# Patient Record
Sex: Female | Born: 1935 | Race: Black or African American | Hispanic: No | Smoking: Former smoker
Health system: Southern US, Community
[De-identification: ages and names within clinical notes are randomized; demographics above are authoritative.]

## PROBLEM LIST (undated history)

## (undated) DIAGNOSIS — K219 Gastro-esophageal reflux disease without esophagitis: Secondary | ICD-10-CM

## (undated) DIAGNOSIS — F419 Anxiety disorder, unspecified: Secondary | ICD-10-CM

## (undated) DIAGNOSIS — I10 Essential (primary) hypertension: Secondary | ICD-10-CM

## (undated) DIAGNOSIS — K5790 Diverticulosis of intestine, part unspecified, without perforation or abscess without bleeding: Secondary | ICD-10-CM

## (undated) DIAGNOSIS — I351 Nonrheumatic aortic (valve) insufficiency: Secondary | ICD-10-CM

## (undated) DIAGNOSIS — M1990 Unspecified osteoarthritis, unspecified site: Secondary | ICD-10-CM

## (undated) DIAGNOSIS — I340 Nonrheumatic mitral (valve) insufficiency: Secondary | ICD-10-CM

## (undated) DIAGNOSIS — E7800 Pure hypercholesterolemia, unspecified: Secondary | ICD-10-CM

## (undated) DIAGNOSIS — F32A Depression, unspecified: Secondary | ICD-10-CM

## (undated) DIAGNOSIS — N189 Chronic kidney disease, unspecified: Secondary | ICD-10-CM

## (undated) DIAGNOSIS — D509 Iron deficiency anemia, unspecified: Secondary | ICD-10-CM

---

## 1997-08-23 ENCOUNTER — Ambulatory Visit (HOSPITAL_COMMUNITY): Admission: RE | Admit: 1997-08-23 | Discharge: 1997-08-23 | Payer: Self-pay | Admitting: Geriatric Medicine

## 2002-06-25 ENCOUNTER — Ambulatory Visit (HOSPITAL_COMMUNITY): Admission: RE | Admit: 2002-06-25 | Discharge: 2002-06-25 | Payer: Self-pay | Admitting: Gastroenterology

## 2002-07-14 ENCOUNTER — Encounter: Payer: Self-pay | Admitting: Geriatric Medicine

## 2002-07-14 ENCOUNTER — Encounter: Admission: RE | Admit: 2002-07-14 | Discharge: 2002-07-14 | Payer: Self-pay | Admitting: Geriatric Medicine

## 2002-12-24 ENCOUNTER — Encounter (INDEPENDENT_AMBULATORY_CARE_PROVIDER_SITE_OTHER): Payer: Self-pay | Admitting: Specialist

## 2002-12-24 ENCOUNTER — Ambulatory Visit (HOSPITAL_BASED_OUTPATIENT_CLINIC_OR_DEPARTMENT_OTHER): Admission: RE | Admit: 2002-12-24 | Discharge: 2002-12-24 | Payer: Self-pay | Admitting: Orthopedic Surgery

## 2003-08-02 ENCOUNTER — Encounter: Admission: RE | Admit: 2003-08-02 | Discharge: 2003-08-02 | Payer: Self-pay | Admitting: Geriatric Medicine

## 2003-08-05 ENCOUNTER — Encounter: Admission: RE | Admit: 2003-08-05 | Discharge: 2003-08-05 | Payer: Self-pay

## 2006-06-27 ENCOUNTER — Encounter: Admission: RE | Admit: 2006-06-27 | Discharge: 2006-06-27 | Payer: Self-pay | Admitting: Geriatric Medicine

## 2006-07-11 ENCOUNTER — Encounter: Admission: RE | Admit: 2006-07-11 | Discharge: 2006-07-11 | Payer: Self-pay | Admitting: Geriatric Medicine

## 2006-10-24 ENCOUNTER — Encounter: Admission: RE | Admit: 2006-10-24 | Discharge: 2006-10-24 | Payer: Self-pay | Admitting: Geriatric Medicine

## 2009-09-04 ENCOUNTER — Encounter: Admission: RE | Admit: 2009-09-04 | Discharge: 2009-09-04 | Payer: Self-pay | Admitting: Geriatric Medicine

## 2010-06-10 ENCOUNTER — Encounter: Payer: Self-pay | Admitting: Geriatric Medicine

## 2010-10-05 NOTE — Op Note (Signed)
   NAME:  Molly Neal, Molly Neal                      ACCOUNT NO.:  [id number]   MEDICAL RECORD NO.:  [id number]                   PATIENT TYPE:  AMB   LOCATION:  DSC                                  FACILITY:  [facility]   PHYSICIAN:  Ronald A. Darrelyn Hillock, M.D.             DATE OF BIRTH:  1931-12-02   DATE OF PROCEDURE:  12/24/2002  DATE OF DISCHARGE:                                 OPERATIVE REPORT   SURGEON:  Georges Lynch. Darrelyn Hillock, M.D.   ASSISTANT:  Ebbie Ridge. Paitsel, P.A.   PREOPERATIVE DIAGNOSES:  Mass involving the olecranon region, right elbow.   POSTOPERATIVE DIAGNOSES:  Mass involving the olecranon region, right elbow.   OPERATION:  Excision of a mass involving the olecranon region right elbow,  this was all soft tissue.   DESCRIPTION OF PROCEDURE:  Under general anesthesia, routine orthopedic prep  and draping of the right elbow was carried out. At this time, an incision  was made over the olecranon region right elbow. Great care was taken to  avoid the ulna nerve. We then went down and identified a mass that was  present in the soft tissue. It was a very hard mass, this was totally  removed and the area underlying it was cauterized. We then closed the wound  with 3-0 nylon suture, a sterile Neosporin dressing was applied and she was  placed in a sling.   FOLLOW UP:  She will be on Vicodin 1 every 4h p.r.n. for pain. The specimen  will be sent to the lab for review and she will see [place] in the office in a  week or prior if she has any problems and she will remain in the sling.                                               Ronald A. Darrelyn Hillock, M.D.    RAG/MEDQ  D:  12/24/2002  T:  12/25/2002  Job:  [id number]

## 2010-10-05 NOTE — Op Note (Signed)
   Neal, Molly                        ACCOUNT NO.:  [id number]   MEDICAL RECORD NO.:  [id number]                   PATIENT TYPE:  AMB   LOCATION:  ENDO                                 FACILITY:  [facility]   PHYSICIAN:  Danise Edge, M.D.                DATE OF BIRTH:  [birth year]   DATE OF PROCEDURE:  06/25/2002  DATE OF DISCHARGE:                                 OPERATIVE REPORT   PROCEDURE:  Colonoscopy.   INDICATIONS:  The patient is a 77 year old female scheduled to undergo her  first colonoscopy with polypectomy to prevent colon cancer.  Her serum iron  saturation was slightly low at 14% with a normal serum ferritin.   ENDOSCOPIST:  Danise Edge, M.D.   PREMEDICATION:  Versed 10 mg, Demerol 100 mg.   ENDOSCOPE:  Olympus pediatric colonoscope.   DESCRIPTION OF PROCEDURE:  After obtaining informed consent, the patient was  placed in the left lateral decubitus position.  I positioned intravenous  Demerol and intravenous Versed to achieve conscious sedation for the  procedure.  The patient's blood pressure, oxygen saturation, and cardiac  rhythm were monitored throughout the procedure and documented in the medical  record.   Anal inspection was normal.  Digital rectal exam was normal.  The Olympus  pediatric video colonoscope was introduced into the rectum and advanced to  the cecum.  Colonic preparation for the exam today was excellent.   Rectum normal.   Sigmoid colon and descending colon:  Left colonic diverticulosis.   Splenic flexure normal.   Transverse colon normal.   Hepatic flexure normal.   Ascending colon normal.   Cecum and ileocecal valve normal.   ASSESSMENT:  Left colonic diverticulosis; otherwise normal proctocolonoscopy  to the cecum.  No endoscopic evidence for the presence of colorectal  neoplasia or lower gastrointestinal bleeding.                                              Danise Edge, M.D.     MJ/MEDQ  D:  06/25/2002  T:   06/26/2002  Job:  [id number]   cc:   Hal T. Stoneking, M.D.  [address] [place], [place] [address]  Fax: [phone]

## 2011-02-07 ENCOUNTER — Emergency Department (HOSPITAL_COMMUNITY): Payer: Medicare Other

## 2011-02-07 ENCOUNTER — Emergency Department (HOSPITAL_COMMUNITY)
Admission: EM | Admit: 2011-02-07 | Discharge: 2011-02-07 | Disposition: A | Discharge status: Home / Self Care | Payer: Medicare Other | Attending: Emergency Medicine | Admitting: Emergency Medicine

## 2011-02-07 DIAGNOSIS — R071 Chest pain on breathing: Secondary | ICD-10-CM | POA: Insufficient documentation

## 2011-02-07 DIAGNOSIS — M542 Cervicalgia: Secondary | ICD-10-CM | POA: Insufficient documentation

## 2011-07-22 DIAGNOSIS — J069 Acute upper respiratory infection, unspecified: Secondary | ICD-10-CM | POA: Diagnosis not present

## 2011-07-22 DIAGNOSIS — I10 Essential (primary) hypertension: Secondary | ICD-10-CM | POA: Diagnosis not present

## 2011-07-22 DIAGNOSIS — M25579 Pain in unspecified ankle and joints of unspecified foot: Secondary | ICD-10-CM | POA: Diagnosis not present

## 2011-07-22 DIAGNOSIS — R609 Edema, unspecified: Secondary | ICD-10-CM | POA: Diagnosis not present

## 2011-08-06 DIAGNOSIS — M25579 Pain in unspecified ankle and joints of unspecified foot: Secondary | ICD-10-CM | POA: Diagnosis not present

## 2011-08-06 DIAGNOSIS — I10 Essential (primary) hypertension: Secondary | ICD-10-CM | POA: Diagnosis not present

## 2011-08-06 DIAGNOSIS — H1130 Conjunctival hemorrhage, unspecified eye: Secondary | ICD-10-CM | POA: Diagnosis not present

## 2011-08-06 DIAGNOSIS — D509 Iron deficiency anemia, unspecified: Secondary | ICD-10-CM | POA: Diagnosis not present

## 2011-08-21 DIAGNOSIS — R05 Cough: Secondary | ICD-10-CM | POA: Diagnosis not present

## 2011-09-10 DIAGNOSIS — I872 Venous insufficiency (chronic) (peripheral): Secondary | ICD-10-CM | POA: Diagnosis not present

## 2011-09-10 DIAGNOSIS — F411 Generalized anxiety disorder: Secondary | ICD-10-CM | POA: Diagnosis not present

## 2011-09-10 DIAGNOSIS — R252 Cramp and spasm: Secondary | ICD-10-CM | POA: Diagnosis not present

## 2011-09-10 DIAGNOSIS — I10 Essential (primary) hypertension: Secondary | ICD-10-CM | POA: Diagnosis not present

## 2011-09-10 DIAGNOSIS — M25569 Pain in unspecified knee: Secondary | ICD-10-CM | POA: Diagnosis not present

## 2011-09-12 DIAGNOSIS — M25569 Pain in unspecified knee: Secondary | ICD-10-CM | POA: Diagnosis not present

## 2011-10-08 DIAGNOSIS — H2510 Age-related nuclear cataract, unspecified eye: Secondary | ICD-10-CM | POA: Diagnosis not present

## 2011-10-08 DIAGNOSIS — H1045 Other chronic allergic conjunctivitis: Secondary | ICD-10-CM | POA: Diagnosis not present

## 2011-10-08 DIAGNOSIS — H04129 Dry eye syndrome of unspecified lacrimal gland: Secondary | ICD-10-CM | POA: Diagnosis not present

## 2011-10-08 DIAGNOSIS — H01139 Eczematous dermatitis of unspecified eye, unspecified eyelid: Secondary | ICD-10-CM | POA: Diagnosis not present

## 2011-10-25 DIAGNOSIS — D126 Benign neoplasm of colon, unspecified: Secondary | ICD-10-CM | POA: Diagnosis not present

## 2011-10-25 DIAGNOSIS — K319 Disease of stomach and duodenum, unspecified: Secondary | ICD-10-CM | POA: Diagnosis not present

## 2011-10-25 DIAGNOSIS — D509 Iron deficiency anemia, unspecified: Secondary | ICD-10-CM | POA: Diagnosis not present

## 2011-10-25 DIAGNOSIS — K2980 Duodenitis without bleeding: Secondary | ICD-10-CM | POA: Diagnosis not present

## 2011-10-25 DIAGNOSIS — A048 Other specified bacterial intestinal infections: Secondary | ICD-10-CM | POA: Diagnosis not present

## 2011-10-25 DIAGNOSIS — K5730 Diverticulosis of large intestine without perforation or abscess without bleeding: Secondary | ICD-10-CM | POA: Diagnosis not present

## 2011-10-25 DIAGNOSIS — K449 Diaphragmatic hernia without obstruction or gangrene: Secondary | ICD-10-CM | POA: Diagnosis not present

## 2011-12-03 DIAGNOSIS — F411 Generalized anxiety disorder: Secondary | ICD-10-CM | POA: Diagnosis not present

## 2011-12-03 DIAGNOSIS — D509 Iron deficiency anemia, unspecified: Secondary | ICD-10-CM | POA: Diagnosis not present

## 2011-12-03 DIAGNOSIS — I10 Essential (primary) hypertension: Secondary | ICD-10-CM | POA: Diagnosis not present

## 2011-12-03 DIAGNOSIS — M899 Disorder of bone, unspecified: Secondary | ICD-10-CM | POA: Diagnosis not present

## 2011-12-03 DIAGNOSIS — M25519 Pain in unspecified shoulder: Secondary | ICD-10-CM | POA: Diagnosis not present

## 2011-12-03 DIAGNOSIS — M949 Disorder of cartilage, unspecified: Secondary | ICD-10-CM | POA: Diagnosis not present

## 2011-12-19 DIAGNOSIS — M899 Disorder of bone, unspecified: Secondary | ICD-10-CM | POA: Diagnosis not present

## 2012-01-17 DIAGNOSIS — M25569 Pain in unspecified knee: Secondary | ICD-10-CM | POA: Diagnosis not present

## 2012-01-23 DIAGNOSIS — M1710 Unilateral primary osteoarthritis, unspecified knee: Secondary | ICD-10-CM | POA: Diagnosis not present

## 2012-01-23 DIAGNOSIS — M25569 Pain in unspecified knee: Secondary | ICD-10-CM | POA: Diagnosis not present

## 2012-06-26 ENCOUNTER — Other Ambulatory Visit: Payer: Self-pay | Admitting: Geriatric Medicine

## 2012-06-26 DIAGNOSIS — M810 Age-related osteoporosis without current pathological fracture: Secondary | ICD-10-CM | POA: Diagnosis not present

## 2012-06-26 DIAGNOSIS — M549 Dorsalgia, unspecified: Secondary | ICD-10-CM | POA: Diagnosis not present

## 2012-06-26 DIAGNOSIS — R634 Abnormal weight loss: Secondary | ICD-10-CM | POA: Diagnosis not present

## 2012-06-26 DIAGNOSIS — Z79899 Other long term (current) drug therapy: Secondary | ICD-10-CM | POA: Diagnosis not present

## 2012-07-03 ENCOUNTER — Other Ambulatory Visit: Payer: Medicare Other

## 2012-07-10 ENCOUNTER — Ambulatory Visit
Admission: RE | Admit: 2012-07-10 | Discharge: 2012-07-10 | Disposition: A | Discharge status: Home / Self Care | Payer: Medicare Other | Source: Ambulatory Visit | Attending: Geriatric Medicine | Admitting: Geriatric Medicine

## 2012-07-10 DIAGNOSIS — M48061 Spinal stenosis, lumbar region without neurogenic claudication: Secondary | ICD-10-CM | POA: Diagnosis not present

## 2012-07-10 DIAGNOSIS — M549 Dorsalgia, unspecified: Secondary | ICD-10-CM

## 2012-07-31 DIAGNOSIS — M48061 Spinal stenosis, lumbar region without neurogenic claudication: Secondary | ICD-10-CM | POA: Diagnosis not present

## 2012-08-12 DIAGNOSIS — M48061 Spinal stenosis, lumbar region without neurogenic claudication: Secondary | ICD-10-CM | POA: Diagnosis not present

## 2012-08-19 DIAGNOSIS — M48061 Spinal stenosis, lumbar region without neurogenic claudication: Secondary | ICD-10-CM | POA: Diagnosis not present

## 2012-08-25 DIAGNOSIS — M48061 Spinal stenosis, lumbar region without neurogenic claudication: Secondary | ICD-10-CM | POA: Diagnosis not present

## 2012-09-04 DIAGNOSIS — M48061 Spinal stenosis, lumbar region without neurogenic claudication: Secondary | ICD-10-CM | POA: Diagnosis not present

## 2012-09-09 DIAGNOSIS — M48061 Spinal stenosis, lumbar region without neurogenic claudication: Secondary | ICD-10-CM | POA: Diagnosis not present

## 2012-09-22 DIAGNOSIS — M48061 Spinal stenosis, lumbar region without neurogenic claudication: Secondary | ICD-10-CM | POA: Diagnosis not present

## 2012-10-21 DIAGNOSIS — R7611 Nonspecific reaction to tuberculin skin test without active tuberculosis: Secondary | ICD-10-CM | POA: Diagnosis not present

## 2012-10-21 DIAGNOSIS — I10 Essential (primary) hypertension: Secondary | ICD-10-CM | POA: Diagnosis not present

## 2012-10-21 DIAGNOSIS — F411 Generalized anxiety disorder: Secondary | ICD-10-CM | POA: Diagnosis not present

## 2012-10-21 DIAGNOSIS — L509 Urticaria, unspecified: Secondary | ICD-10-CM | POA: Diagnosis not present

## 2012-10-21 DIAGNOSIS — R634 Abnormal weight loss: Secondary | ICD-10-CM | POA: Diagnosis not present

## 2012-10-30 DIAGNOSIS — M48061 Spinal stenosis, lumbar region without neurogenic claudication: Secondary | ICD-10-CM | POA: Diagnosis not present

## 2012-11-06 DIAGNOSIS — M48061 Spinal stenosis, lumbar region without neurogenic claudication: Secondary | ICD-10-CM | POA: Diagnosis not present

## 2012-11-10 DIAGNOSIS — M48061 Spinal stenosis, lumbar region without neurogenic claudication: Secondary | ICD-10-CM | POA: Diagnosis not present

## 2012-11-12 DIAGNOSIS — M48061 Spinal stenosis, lumbar region without neurogenic claudication: Secondary | ICD-10-CM | POA: Diagnosis not present

## 2012-11-18 DIAGNOSIS — M545 Low back pain: Secondary | ICD-10-CM | POA: Diagnosis not present

## 2012-11-24 DIAGNOSIS — M545 Low back pain: Secondary | ICD-10-CM | POA: Diagnosis not present

## 2012-12-03 DIAGNOSIS — M545 Low back pain: Secondary | ICD-10-CM | POA: Diagnosis not present

## 2012-12-17 DIAGNOSIS — M545 Low back pain: Secondary | ICD-10-CM | POA: Diagnosis not present

## 2012-12-18 DIAGNOSIS — M545 Low back pain: Secondary | ICD-10-CM | POA: Diagnosis not present

## 2012-12-22 DIAGNOSIS — M545 Low back pain: Secondary | ICD-10-CM | POA: Diagnosis not present

## 2012-12-25 DIAGNOSIS — M545 Low back pain: Secondary | ICD-10-CM | POA: Diagnosis not present

## 2013-01-01 DIAGNOSIS — L03119 Cellulitis of unspecified part of limb: Secondary | ICD-10-CM | POA: Diagnosis not present

## 2013-01-01 DIAGNOSIS — L02419 Cutaneous abscess of limb, unspecified: Secondary | ICD-10-CM | POA: Diagnosis not present

## 2013-01-01 DIAGNOSIS — M545 Low back pain: Secondary | ICD-10-CM | POA: Diagnosis not present

## 2013-01-05 DIAGNOSIS — M545 Low back pain: Secondary | ICD-10-CM | POA: Diagnosis not present

## 2013-01-07 DIAGNOSIS — M545 Low back pain: Secondary | ICD-10-CM | POA: Diagnosis not present

## 2013-01-08 DIAGNOSIS — H2510 Age-related nuclear cataract, unspecified eye: Secondary | ICD-10-CM | POA: Diagnosis not present

## 2013-01-08 DIAGNOSIS — H04129 Dry eye syndrome of unspecified lacrimal gland: Secondary | ICD-10-CM | POA: Diagnosis not present

## 2013-01-12 DIAGNOSIS — M545 Low back pain: Secondary | ICD-10-CM | POA: Diagnosis not present

## 2013-01-15 DIAGNOSIS — M545 Low back pain: Secondary | ICD-10-CM | POA: Diagnosis not present

## 2013-01-19 DIAGNOSIS — M545 Low back pain: Secondary | ICD-10-CM | POA: Diagnosis not present

## 2013-01-20 DIAGNOSIS — M1990 Unspecified osteoarthritis, unspecified site: Secondary | ICD-10-CM | POA: Diagnosis not present

## 2013-01-20 DIAGNOSIS — Z79899 Other long term (current) drug therapy: Secondary | ICD-10-CM | POA: Diagnosis not present

## 2013-01-20 DIAGNOSIS — I10 Essential (primary) hypertension: Secondary | ICD-10-CM | POA: Diagnosis not present

## 2013-01-20 DIAGNOSIS — D509 Iron deficiency anemia, unspecified: Secondary | ICD-10-CM | POA: Diagnosis not present

## 2013-01-29 DIAGNOSIS — M545 Low back pain: Secondary | ICD-10-CM | POA: Diagnosis not present

## 2013-02-05 DIAGNOSIS — M2550 Pain in unspecified joint: Secondary | ICD-10-CM | POA: Diagnosis not present

## 2013-02-05 DIAGNOSIS — M545 Low back pain: Secondary | ICD-10-CM | POA: Diagnosis not present

## 2013-02-05 DIAGNOSIS — R262 Difficulty in walking, not elsewhere classified: Secondary | ICD-10-CM | POA: Diagnosis not present

## 2013-02-05 DIAGNOSIS — IMO0002 Reserved for concepts with insufficient information to code with codable children: Secondary | ICD-10-CM | POA: Diagnosis not present

## 2013-02-12 DIAGNOSIS — IMO0002 Reserved for concepts with insufficient information to code with codable children: Secondary | ICD-10-CM | POA: Diagnosis not present

## 2013-02-12 DIAGNOSIS — R262 Difficulty in walking, not elsewhere classified: Secondary | ICD-10-CM | POA: Diagnosis not present

## 2013-02-12 DIAGNOSIS — M2550 Pain in unspecified joint: Secondary | ICD-10-CM | POA: Diagnosis not present

## 2013-02-12 DIAGNOSIS — M545 Low back pain: Secondary | ICD-10-CM | POA: Diagnosis not present

## 2013-02-17 DIAGNOSIS — R262 Difficulty in walking, not elsewhere classified: Secondary | ICD-10-CM | POA: Diagnosis not present

## 2013-02-17 DIAGNOSIS — M2550 Pain in unspecified joint: Secondary | ICD-10-CM | POA: Diagnosis not present

## 2013-02-17 DIAGNOSIS — M545 Low back pain: Secondary | ICD-10-CM | POA: Diagnosis not present

## 2013-02-17 DIAGNOSIS — IMO0002 Reserved for concepts with insufficient information to code with codable children: Secondary | ICD-10-CM | POA: Diagnosis not present

## 2013-02-19 DIAGNOSIS — M2550 Pain in unspecified joint: Secondary | ICD-10-CM | POA: Diagnosis not present

## 2013-02-19 DIAGNOSIS — M545 Low back pain: Secondary | ICD-10-CM | POA: Diagnosis not present

## 2013-02-19 DIAGNOSIS — R262 Difficulty in walking, not elsewhere classified: Secondary | ICD-10-CM | POA: Diagnosis not present

## 2013-02-19 DIAGNOSIS — IMO0002 Reserved for concepts with insufficient information to code with codable children: Secondary | ICD-10-CM | POA: Diagnosis not present

## 2013-02-24 DIAGNOSIS — M545 Low back pain: Secondary | ICD-10-CM | POA: Diagnosis not present

## 2013-02-24 DIAGNOSIS — IMO0002 Reserved for concepts with insufficient information to code with codable children: Secondary | ICD-10-CM | POA: Diagnosis not present

## 2013-02-24 DIAGNOSIS — R262 Difficulty in walking, not elsewhere classified: Secondary | ICD-10-CM | POA: Diagnosis not present

## 2013-02-24 DIAGNOSIS — M2550 Pain in unspecified joint: Secondary | ICD-10-CM | POA: Diagnosis not present

## 2013-02-26 DIAGNOSIS — R262 Difficulty in walking, not elsewhere classified: Secondary | ICD-10-CM | POA: Diagnosis not present

## 2013-02-26 DIAGNOSIS — IMO0002 Reserved for concepts with insufficient information to code with codable children: Secondary | ICD-10-CM | POA: Diagnosis not present

## 2013-02-26 DIAGNOSIS — M2550 Pain in unspecified joint: Secondary | ICD-10-CM | POA: Diagnosis not present

## 2013-02-26 DIAGNOSIS — M545 Low back pain: Secondary | ICD-10-CM | POA: Diagnosis not present

## 2013-03-03 DIAGNOSIS — R262 Difficulty in walking, not elsewhere classified: Secondary | ICD-10-CM | POA: Diagnosis not present

## 2013-03-03 DIAGNOSIS — M2550 Pain in unspecified joint: Secondary | ICD-10-CM | POA: Diagnosis not present

## 2013-03-03 DIAGNOSIS — IMO0002 Reserved for concepts with insufficient information to code with codable children: Secondary | ICD-10-CM | POA: Diagnosis not present

## 2013-03-03 DIAGNOSIS — M545 Low back pain: Secondary | ICD-10-CM | POA: Diagnosis not present

## 2013-03-05 DIAGNOSIS — IMO0002 Reserved for concepts with insufficient information to code with codable children: Secondary | ICD-10-CM | POA: Diagnosis not present

## 2013-03-05 DIAGNOSIS — M2550 Pain in unspecified joint: Secondary | ICD-10-CM | POA: Diagnosis not present

## 2013-03-05 DIAGNOSIS — R262 Difficulty in walking, not elsewhere classified: Secondary | ICD-10-CM | POA: Diagnosis not present

## 2013-03-05 DIAGNOSIS — M545 Low back pain: Secondary | ICD-10-CM | POA: Diagnosis not present

## 2013-03-19 DIAGNOSIS — R262 Difficulty in walking, not elsewhere classified: Secondary | ICD-10-CM | POA: Diagnosis not present

## 2013-03-19 DIAGNOSIS — IMO0002 Reserved for concepts with insufficient information to code with codable children: Secondary | ICD-10-CM | POA: Diagnosis not present

## 2013-03-19 DIAGNOSIS — M2550 Pain in unspecified joint: Secondary | ICD-10-CM | POA: Diagnosis not present

## 2013-03-19 DIAGNOSIS — M545 Low back pain: Secondary | ICD-10-CM | POA: Diagnosis not present

## 2013-05-26 DIAGNOSIS — I10 Essential (primary) hypertension: Secondary | ICD-10-CM | POA: Diagnosis not present

## 2013-05-26 DIAGNOSIS — Z1331 Encounter for screening for depression: Secondary | ICD-10-CM | POA: Diagnosis not present

## 2013-05-26 DIAGNOSIS — Z23 Encounter for immunization: Secondary | ICD-10-CM | POA: Diagnosis not present

## 2013-05-26 DIAGNOSIS — Z0000 Encounter for general adult medical examination without abnormal findings: Secondary | ICD-10-CM | POA: Diagnosis not present

## 2013-05-28 ENCOUNTER — Other Ambulatory Visit: Payer: Self-pay | Admitting: Geriatric Medicine

## 2013-05-28 DIAGNOSIS — Z1231 Encounter for screening mammogram for malignant neoplasm of breast: Secondary | ICD-10-CM

## 2013-06-15 ENCOUNTER — Ambulatory Visit
Admission: RE | Admit: 2013-06-15 | Discharge: 2013-06-15 | Disposition: A | Discharge status: Home / Self Care | Payer: Medicare Other | Source: Ambulatory Visit | Attending: Geriatric Medicine | Admitting: Geriatric Medicine

## 2013-06-15 DIAGNOSIS — Z1231 Encounter for screening mammogram for malignant neoplasm of breast: Secondary | ICD-10-CM | POA: Diagnosis not present

## 2013-06-17 ENCOUNTER — Other Ambulatory Visit: Payer: Self-pay | Admitting: Geriatric Medicine

## 2013-06-17 DIAGNOSIS — R928 Other abnormal and inconclusive findings on diagnostic imaging of breast: Secondary | ICD-10-CM

## 2013-06-28 ENCOUNTER — Ambulatory Visit
Admission: RE | Admit: 2013-06-28 | Discharge: 2013-06-28 | Disposition: A | Discharge status: Home / Self Care | Payer: Medicare Other | Source: Ambulatory Visit | Attending: Geriatric Medicine | Admitting: Geriatric Medicine

## 2013-06-28 DIAGNOSIS — R928 Other abnormal and inconclusive findings on diagnostic imaging of breast: Secondary | ICD-10-CM

## 2013-06-28 DIAGNOSIS — N6049 Mammary duct ectasia of unspecified breast: Secondary | ICD-10-CM | POA: Diagnosis not present

## 2013-08-24 DIAGNOSIS — I10 Essential (primary) hypertension: Secondary | ICD-10-CM | POA: Diagnosis not present

## 2013-08-24 DIAGNOSIS — M25569 Pain in unspecified knee: Secondary | ICD-10-CM | POA: Diagnosis not present

## 2013-08-31 DIAGNOSIS — M1710 Unilateral primary osteoarthritis, unspecified knee: Secondary | ICD-10-CM | POA: Diagnosis not present

## 2013-12-28 DIAGNOSIS — Z23 Encounter for immunization: Secondary | ICD-10-CM | POA: Diagnosis not present

## 2013-12-28 DIAGNOSIS — Z79899 Other long term (current) drug therapy: Secondary | ICD-10-CM | POA: Diagnosis not present

## 2013-12-28 DIAGNOSIS — M1990 Unspecified osteoarthritis, unspecified site: Secondary | ICD-10-CM | POA: Diagnosis not present

## 2013-12-28 DIAGNOSIS — I10 Essential (primary) hypertension: Secondary | ICD-10-CM | POA: Diagnosis not present

## 2014-01-20 DIAGNOSIS — M1710 Unilateral primary osteoarthritis, unspecified knee: Secondary | ICD-10-CM | POA: Diagnosis not present

## 2014-02-21 DIAGNOSIS — I10 Essential (primary) hypertension: Secondary | ICD-10-CM | POA: Diagnosis not present

## 2014-02-21 DIAGNOSIS — R202 Paresthesia of skin: Secondary | ICD-10-CM | POA: Diagnosis not present

## 2014-02-21 DIAGNOSIS — R600 Localized edema: Secondary | ICD-10-CM | POA: Diagnosis not present

## 2014-03-21 DIAGNOSIS — I10 Essential (primary) hypertension: Secondary | ICD-10-CM | POA: Diagnosis not present

## 2014-03-21 DIAGNOSIS — R269 Unspecified abnormalities of gait and mobility: Secondary | ICD-10-CM | POA: Diagnosis not present

## 2014-04-28 ENCOUNTER — Emergency Department (HOSPITAL_COMMUNITY): Payer: Medicare Other

## 2014-04-28 ENCOUNTER — Emergency Department (HOSPITAL_COMMUNITY)
Admission: EM | Admit: 2014-04-28 | Discharge: 2014-04-28 | Disposition: A | Discharge status: Home / Self Care | Payer: Medicare Other | Attending: Emergency Medicine | Admitting: Emergency Medicine

## 2014-04-28 ENCOUNTER — Encounter (HOSPITAL_COMMUNITY): Payer: Self-pay

## 2014-04-28 DIAGNOSIS — Y92219 Unspecified school as the place of occurrence of the external cause: Secondary | ICD-10-CM | POA: Insufficient documentation

## 2014-04-28 DIAGNOSIS — S6992XA Unspecified injury of left wrist, hand and finger(s), initial encounter: Secondary | ICD-10-CM | POA: Diagnosis not present

## 2014-04-28 DIAGNOSIS — Y9389 Activity, other specified: Secondary | ICD-10-CM | POA: Diagnosis not present

## 2014-04-28 DIAGNOSIS — W01190A Fall on same level from slipping, tripping and stumbling with subsequent striking against furniture, initial encounter: Secondary | ICD-10-CM | POA: Insufficient documentation

## 2014-04-28 DIAGNOSIS — Y992 Volunteer activity: Secondary | ICD-10-CM | POA: Diagnosis not present

## 2014-04-28 DIAGNOSIS — M79642 Pain in left hand: Secondary | ICD-10-CM | POA: Diagnosis not present

## 2014-04-28 DIAGNOSIS — S59912A Unspecified injury of left forearm, initial encounter: Secondary | ICD-10-CM | POA: Diagnosis not present

## 2014-04-28 DIAGNOSIS — M25512 Pain in left shoulder: Secondary | ICD-10-CM | POA: Diagnosis not present

## 2014-04-28 DIAGNOSIS — R6884 Jaw pain: Secondary | ICD-10-CM | POA: Diagnosis not present

## 2014-04-28 DIAGNOSIS — W19XXXA Unspecified fall, initial encounter: Secondary | ICD-10-CM

## 2014-04-28 DIAGNOSIS — S4992XA Unspecified injury of left shoulder and upper arm, initial encounter: Secondary | ICD-10-CM | POA: Diagnosis not present

## 2014-04-28 DIAGNOSIS — I10 Essential (primary) hypertension: Secondary | ICD-10-CM | POA: Insufficient documentation

## 2014-04-28 HISTORY — DX: Unspecified osteoarthritis, unspecified site: M19.90

## 2014-04-28 HISTORY — DX: Essential (primary) hypertension: I10

## 2014-04-28 MED ORDER — TRAMADOL HCL 50 MG PO TABS: 50.0000 mg | ORAL_TABLET | Freq: Four times a day (QID) | ORAL | Status: AC | PRN

## 2014-04-28 NOTE — ED Notes (Signed)
PA at bedside.

## 2014-04-28 NOTE — ED Notes (Signed)
Pt ambulated to restroom without distress.  
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
## 2014-04-28 NOTE — ED Notes (Signed)
Patient transported to X-ray without distress.  
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
## 2014-04-28 NOTE — Discharge Instructions (Signed)

## 2014-04-28 NOTE — ED Notes (Signed)
MD at bedside. 

## 2014-05-30 DIAGNOSIS — Z0000 Encounter for general adult medical examination without abnormal findings: Secondary | ICD-10-CM | POA: Diagnosis not present

## 2014-05-30 DIAGNOSIS — I351 Nonrheumatic aortic (valve) insufficiency: Secondary | ICD-10-CM | POA: Diagnosis not present

## 2014-05-30 DIAGNOSIS — Z79899 Other long term (current) drug therapy: Secondary | ICD-10-CM | POA: Diagnosis not present

## 2014-05-30 DIAGNOSIS — F325 Major depressive disorder, single episode, in full remission: Secondary | ICD-10-CM | POA: Diagnosis not present

## 2014-05-30 DIAGNOSIS — E780 Pure hypercholesterolemia: Secondary | ICD-10-CM | POA: Diagnosis not present

## 2014-05-30 DIAGNOSIS — K219 Gastro-esophageal reflux disease without esophagitis: Secondary | ICD-10-CM | POA: Diagnosis not present

## 2014-05-30 DIAGNOSIS — M810 Age-related osteoporosis without current pathological fracture: Secondary | ICD-10-CM | POA: Diagnosis not present

## 2014-05-30 DIAGNOSIS — I10 Essential (primary) hypertension: Secondary | ICD-10-CM | POA: Diagnosis not present

## 2014-05-30 DIAGNOSIS — Z23 Encounter for immunization: Secondary | ICD-10-CM | POA: Diagnosis not present

## 2014-05-30 DIAGNOSIS — Z1389 Encounter for screening for other disorder: Secondary | ICD-10-CM | POA: Diagnosis not present

## 2014-06-15 DIAGNOSIS — M810 Age-related osteoporosis without current pathological fracture: Secondary | ICD-10-CM | POA: Diagnosis not present

## 2014-07-14 DIAGNOSIS — M810 Age-related osteoporosis without current pathological fracture: Secondary | ICD-10-CM | POA: Diagnosis not present

## 2014-07-14 DIAGNOSIS — E559 Vitamin D deficiency, unspecified: Secondary | ICD-10-CM | POA: Diagnosis not present

## 2014-07-20 ENCOUNTER — Emergency Department (HOSPITAL_COMMUNITY)
Admission: EM | Admit: 2014-07-20 | Discharge: 2014-07-20 | Disposition: A | Discharge status: Home / Self Care | Payer: Medicare Other | Attending: Emergency Medicine | Admitting: Emergency Medicine

## 2014-07-20 ENCOUNTER — Encounter (HOSPITAL_COMMUNITY): Payer: Self-pay | Admitting: *Deleted

## 2014-07-20 ENCOUNTER — Emergency Department (HOSPITAL_COMMUNITY): Payer: Medicare Other

## 2014-07-20 DIAGNOSIS — I10 Essential (primary) hypertension: Secondary | ICD-10-CM | POA: Diagnosis not present

## 2014-07-20 DIAGNOSIS — R079 Chest pain, unspecified: Secondary | ICD-10-CM | POA: Diagnosis not present

## 2014-07-20 DIAGNOSIS — Z8739 Personal history of other diseases of the musculoskeletal system and connective tissue: Secondary | ICD-10-CM | POA: Insufficient documentation

## 2014-07-20 DIAGNOSIS — R1013 Epigastric pain: Secondary | ICD-10-CM | POA: Diagnosis present

## 2014-07-20 DIAGNOSIS — R0789 Other chest pain: Secondary | ICD-10-CM | POA: Diagnosis not present

## 2014-07-20 DIAGNOSIS — Z79899 Other long term (current) drug therapy: Secondary | ICD-10-CM | POA: Diagnosis not present

## 2014-07-20 DIAGNOSIS — K224 Dyskinesia of esophagus: Secondary | ICD-10-CM | POA: Diagnosis not present

## 2014-07-20 LAB — COMPREHENSIVE METABOLIC PANEL
ALBUMIN: 4.5 g/dL (ref 3.5–5.2)
ALT: 15 U/L (ref 0–35)
ANION GAP: 7 (ref 5–15)
AST: 45 U/L — ABNORMAL HIGH (ref 0–37)
Alkaline Phosphatase: 100 U/L (ref 39–117)
BILIRUBIN TOTAL: 1.1 mg/dL (ref 0.3–1.2)
BUN: 22 mg/dL (ref 6–23)
CO2: 29 mmol/L (ref 19–32)
CREATININE: 0.98 mg/dL (ref 0.50–1.10)
Calcium: 10.2 mg/dL (ref 8.4–10.5)
Chloride: 106 mmol/L (ref 96–112)
GFR calc non Af Amer: 53 mL/min — ABNORMAL LOW (ref >90)
GFR, EST AFRICAN AMERICAN: 61 mL/min — AB (ref >90)
Glucose, Bld: 116 mg/dL — ABNORMAL HIGH (ref 70–99)
Potassium: 4 mmol/L (ref 3.5–5.1)
Sodium: 142 mmol/L (ref 135–145)
TOTAL PROTEIN: 8.5 g/dL — AB (ref 6.0–8.3)

## 2014-07-20 LAB — I-STAT TROPONIN, ED
TROPONIN I, POC: 0 ng/mL (ref 0.00–0.08)
Troponin i, poc: 0 ng/mL (ref 0.00–0.08)

## 2014-07-20 LAB — CBC
HEMATOCRIT: 41.2 % (ref 36.0–46.0)
Hemoglobin: 13.2 g/dL (ref 12.0–15.0)
MCH: 26.6 pg (ref 26.0–34.0)
MCHC: 32 g/dL (ref 30.0–36.0)
MCV: 83.1 fL (ref 78.0–100.0)
Platelets: 256 10*3/uL (ref 150–400)
RBC: 4.96 MIL/uL (ref 3.87–5.11)
RDW: 13.2 % (ref 11.5–15.5)
WBC: 6.5 10*3/uL (ref 4.0–10.5)

## 2014-07-20 LAB — LIPASE, BLOOD: Lipase: 28 U/L (ref 11–59)

## 2014-07-20 MED ORDER — SODIUM CHLORIDE 0.9 % IV BOLUS (SEPSIS)
1000.0000 mL | Freq: Once | INTRAVENOUS | Status: AC
  Administered 2014-07-20: 1000 mL via INTRAVENOUS

## 2014-07-20 MED ORDER — ONDANSETRON HCL 4 MG/2ML IJ SOLN
4.0000 mg | Freq: Once | INTRAMUSCULAR | Status: AC
  Administered 2014-07-20: 4 mg via INTRAVENOUS
  Filled 2014-07-20: qty 2

## 2014-07-20 MED ORDER — GI COCKTAIL ~~LOC~~
30.0000 mL | Freq: Once | ORAL | Status: AC
  Administered 2014-07-20: 30 mL via ORAL
  Filled 2014-07-20: qty 30

## 2014-07-20 MED ORDER — GLUCAGON HCL RDNA (DIAGNOSTIC) 1 MG IJ SOLR
1.0000 mg | Freq: Once | INTRAMUSCULAR | Status: AC
Start: 2014-07-20 — End: 2014-07-20
  Administered 2014-07-20: 1 mg via INTRAVENOUS
  Filled 2014-07-20: qty 1

## 2014-07-20 MED ORDER — PANTOPRAZOLE SODIUM 40 MG PO TBEC
40.0000 mg | DELAYED_RELEASE_TABLET | Freq: Once | ORAL | Status: AC
  Administered 2014-07-20: 40 mg via ORAL
  Filled 2014-07-20: qty 1

## 2014-07-20 MED ORDER — STERILE WATER FOR INJECTION IJ SOLN
INTRAMUSCULAR | Status: AC
  Administered 2014-07-20: 10 mL
  Filled 2014-07-20: qty 10

## 2014-07-20 MED ORDER — ESOMEPRAZOLE MAGNESIUM 40 MG PO CPDR: DELAYED_RELEASE_CAPSULE | ORAL | Status: DC

## 2014-07-20 MED ORDER — DIAZEPAM 5 MG/ML IJ SOLN
2.5000 mg | Freq: Once | INTRAMUSCULAR | Status: AC
  Administered 2014-07-20: 2.5 mg via INTRAVENOUS
  Filled 2014-07-20: qty 2

## 2014-07-20 MED ORDER — NITROGLYCERIN 0.4 MG SL SUBL
0.4000 mg | SUBLINGUAL_TABLET | SUBLINGUAL | Status: DC | PRN
  Administered 2014-07-20: 0.4 mg via SUBLINGUAL
  Filled 2014-07-20: qty 1

## 2014-07-20 NOTE — ED Notes (Signed)
Tolerated swallowing water very well.

## 2014-07-20 NOTE — ED Notes (Signed)
Pt in stating that since yesterday she thinks she has a food blockage in her throat, unable to tolerate eating or drinking anything, states she can swallow things but it comes back up, no distress noted

## 2014-07-20 NOTE — ED Notes (Signed)
Pt ambulated to restroom with steady gait.

## 2014-07-20 NOTE — ED Notes (Signed)
Pt reports that she was unable to take any of her medications today.

## 2014-07-20 NOTE — ED Provider Notes (Signed)
CSN: [id number]     Arrival date & time 07/20/14  1112 History   First MD Initiated Contact with Patient 07/20/14 1121     Chief Complaint  Patient presents with  . Foreign Body     (Consider location/radiation/quality/duration/timing/severity/associated sxs/prior Treatment) Patient is a 81 y.o. female presenting with foreign body and chest pain. The history is provided by the patient.  Foreign Body Associated symptoms: no abdominal pain, no cough and no vomiting   Chest Pain Pain location:  Epigastric Pain quality: aching and burning   Pain radiates to:  Does not radiate Pain radiates to the back: no   Pain severity:  Severe Onset quality:  Gradual Timing:  Constant Progression:  Unchanged Chronicity:  Recurrent Context: at rest   Associated symptoms: no abdominal pain, no cough, no fever, no shortness of breath and not vomiting     Past Medical History  Diagnosis Date  . Hypertension   . Arthritis    History reviewed. No pertinent past surgical history. History reviewed. No pertinent family history. History  Substance Use Topics  . Smoking status: Never Smoker   . Smokeless tobacco: Not on file  . Alcohol Use: No   OB History    No data available     Review of Systems  Constitutional: Negative for fever.  Respiratory: Negative for cough and shortness of breath.   Cardiovascular: Positive for chest pain.  Gastrointestinal: Negative for vomiting and abdominal pain.  All other systems reviewed and are negative.     Allergies  Aspirin  Home Medications   Prior to Admission medications   Medication Sig Start Date End Date Taking? Authorizing Provider  traMADol (ULTRAM) 50 MG tablet Take 1 tablet (50 mg total) by mouth every 6 (six) hours as needed. 04/28/14   Montine Circle, PA-C   BP 163/70 mmHg  Pulse 80  Temp(Src) 98.7 F (37.1 C)  Resp 22  Ht 5\' 3"  (1.6 m)  Wt 125 lb (56.7 kg)  BMI 22.15 kg/m2  SpO2 100% Physical Exam  Constitutional: She is  oriented to person, place, and time. She appears well-developed and well-nourished. No distress.  HENT:  Head: Normocephalic and atraumatic.  Mouth/Throat: Oropharynx is clear and moist.  Eyes: EOM are normal. Pupils are equal, round, and reactive to light.  Neck: Normal range of motion. Neck supple.  Cardiovascular: Normal rate and regular rhythm.  Exam reveals no friction rub.   No murmur heard. Pulmonary/Chest: Effort normal and breath sounds normal. No respiratory distress. She has no wheezes. She has no rales.  Abdominal: Soft. She exhibits no distension. There is tenderness (epigastric). There is no rebound.  Musculoskeletal: Normal range of motion. She exhibits no edema.  Neurological: She is alert and oriented to person, place, and time.  Skin: No rash noted. She is not diaphoretic.  Nursing note and vitals reviewed.   ED Course  Procedures (including critical care time) Labs Review Labs Reviewed  CBC  COMPREHENSIVE METABOLIC PANEL  LIPASE, BLOOD  I-STAT TROPOININ, ED    Imaging Review Dg Chest Portable 1 View  07/20/2014   CLINICAL DATA:  Chest fullness  EXAM: PORTABLE CHEST - 1 VIEW  COMPARISON:  10/21/2012  FINDINGS: Cardiac shadow is again enlarged. The lungs are well aerated bilaterally. No focal infiltrate or sizable effusion is seen. Patient rotation to the left accentuates the mediastinal markings.  IMPRESSION: No acute abnormality noted.   Electronically Signed   By: Inez Catalina M.D.   On: 07/20/2014 12:50  EKG Interpretation   Date/Time:  Wednesday July 20 2014 12:39:59 EST Ventricular Rate:  69 PR Interval:  137 QRS Duration: 91 QT Interval:  417 QTC Calculation: 447 R Axis:   -39 Text Interpretation:  Sinus rhythm Ventricular premature complex Sinus  pause Left axis deviation Probable anterior infarct, age indeterminate No  significant change since last tracing Confirmed by Mingo Amber  MD, Lasheena Frieze  [phone]) on 07/20/2014 5:07:42 PM      MDM   Final  diagnoses:  Esophageal spasm    71F here with epigastric chest pain. Feels like food is stuck at the bottom of her esophagus. Intermittent periods of this prior, but never lasted this long. Wasn't eating when this happened, was getting prepared to eat. Belching, spitting up. No respiratory distress. I feel this is likely esophageal spasm. Doubt ACS since she's persistently belching in the room Will start with valium, glucagon, NTG.  Feeling better after medications. Chest x-ray is normal. Serial troponins are normal. I spoke with patient's PCP, Dr. Felipa Eth, who stated she does have a large hiatal hernia and history of GERD. We'll put patient on PPI. Dr. Felipa Eth was comfortable with this plan. He has reported she has had similar issues in the past. Stable for discharge.   Evelina Bucy, MD 07/20/14 [phone]

## 2014-07-20 NOTE — Discharge Instructions (Signed)
Esophageal Spasm °Esophageal spasm is an uncoordinated contraction of the muscles of the esophagus (the tube which carries food from your mouth to your stomach). Normally, the muscles of the esophagus alternate between contraction and relaxation starting from the top of the esophagus and working down to the bottom. This moves the food from the mouth to the stomach. In esophageal spasm, all the muscles contract at once. This causes pain and fails to move the food along. As a result, you may have trouble swallowing.  °Women are more likely than men to have esophageal spasm. The cause of the spasms is not known. Sometimes eating hot or cold foods triggers the condition and this may be due to an overly sensitive esophagus. This is not an infectious disease and cannot be passed to others. °SYMPTOMS  °Symptoms of esophageal spasm may include: chest pain, burning or pain with swallowing, and difficulty swallowing.  °DIAGNOSIS  °Esophageal spasm can be diagnosed by a test called manometry (pressure studies of the esophagus). In this test, a special tube is inserted down the esophagus. The tube measures the muscle activity of the esophagus. Abnormal contractions mixed with normal movement helps confirm the diagnosis.  °A person with a hypersensitive esophagus may be diagnosed by inflating a long balloon in the person's esophagus. If this causes the same symptoms, preventive methods may work. °PREVENTION  °Avoid hot or cold foods if that seems to be a trigger. °PROGNOSIS  °This condition does not go away, nor is treatment entirely satisfactory. Patients need to be careful of what they eat. They need to continue on medication if a useful one is found. Fortunately, the condition does not get progressively worse as time passes. °Esophageal spasm does not usually lead to more serious problems but sometimes the pain can be disabling. If a person becomes afraid to eat they may become malnourished and lose weight.  °TREATMENT  °· A  procedure in which instruments of increasing size are inserted through the esophagus to enlarge (dilate) it are used. °· Medications that decrease acid-production of the stomach may be used such as proton-pump inhibitors or H2-blockers. °· Medications of several types can be used to relax the muscles of the esophagus. °· An individual with a hypersensitive esophagus sometimes improves with low doses of medications normally used for depression. °· No treatment for esophageal spasm is effective for everyone. Often several approaches will be tried before one works. In many cases, the symptoms will improve, but will not go away completely. °· For severe cases, relief is obtained two-thirds of the time by cutting the muscles along the entire length of the esophagus. This is a major surgical procedure. °· Your symptoms are usually the best guide to how well the treatment for esophageal spasm works. °SIDE EFFECTS OF TREATMENTS °· Nitrates can cause headaches and low blood pressure. °· Calcium channel blockers can cause: °¨ Feeling sick to your stomach (nausea). °¨ Constipation and other side effects. °· Antidepressants can cause side effects that depend on the medication used. °HOME CARE INSTRUCTIONS  °· Let your caregiver know if problems are getting worse, or if you get food stuck in your esophagus for longer than 1 hour or as directed and are unable to swallow liquid. °· Take medications as directed and with permission of your caregiver. Ask about what to do if a medication seems to get stuck in your esophagus. Only take over-the-counter or prescription medicines for pain, discomfort, or fever as directed by your caregiver. °· Soft and liquid foods   pass more easily than solid pieces. °SEEK IMMEDIATE MEDICAL CARE IF:  °· You develop severe chest pain, especially if the pain is crushing or pressure-like and spreads to the arms, back, neck, or jaw, or if you have sweating, nausea, or shortness of breath. THIS COULD BE AN  EMERGENCY. Do not wait to see if the pain will go away. Get medical help at once. Call 911 or 0 (operator). DO NOT drive yourself to the hospital. °· Your chest pain gets worse and does not go away with rest. °· You have an attack of chest pain lasting longer than usual despite rest and treatment with the medications your physician has prescribed. °· You wake from sleep with chest pain or shortness of breath. °· You feel dizzy or faint. °· You have chest pain, not typical of your usual pain, caused by your esophagus for which you originally saw your caregiver. °MAKE SURE YOU:  °· Understand these instructions. °· Will watch your condition. °· Will get help right away if you are not doing well or get worse. °Document Released: 07/27/2002 Document Revised: 07/29/2011 Document Reviewed: 07/30/2013 °[employer]® Patient Information ©2015 [employer]. This information is not intended to replace advice given to you by your health care provider. Make sure you discuss any questions you have with your health care provider. ° °

## 2014-11-11 DIAGNOSIS — I10 Essential (primary) hypertension: Secondary | ICD-10-CM | POA: Diagnosis not present

## 2014-11-11 DIAGNOSIS — M1990 Unspecified osteoarthritis, unspecified site: Secondary | ICD-10-CM | POA: Diagnosis not present

## 2014-11-14 DIAGNOSIS — M170 Bilateral primary osteoarthritis of knee: Secondary | ICD-10-CM | POA: Diagnosis not present

## 2014-12-20 DIAGNOSIS — R21 Rash and other nonspecific skin eruption: Secondary | ICD-10-CM | POA: Diagnosis not present

## 2014-12-20 DIAGNOSIS — I10 Essential (primary) hypertension: Secondary | ICD-10-CM | POA: Diagnosis not present

## 2015-02-24 IMAGING — DX DG HAND COMPLETE 3+V*L*
3 series · 3 of 3 positions shown · non-contrast
Comparison: None.

CLINICAL DATA: Fell.  Pain.  Pain in the left hand.

EXAM:
LEFT HAND - COMPLETE 3+ VIEW

[hand ap]
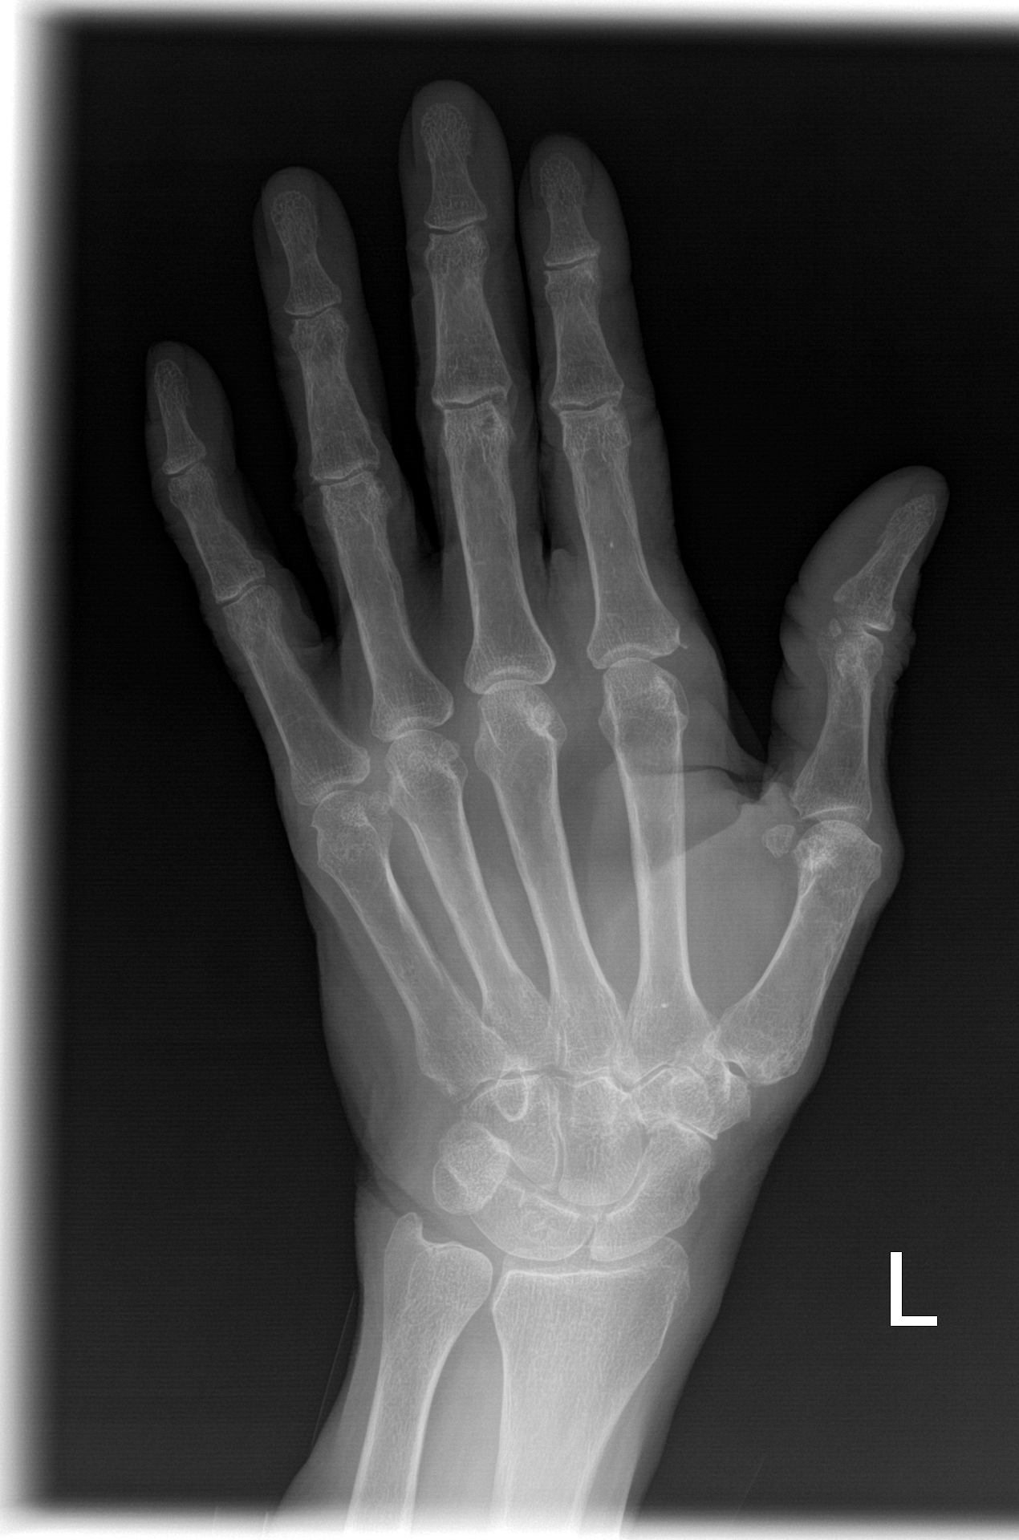

[hand obl]
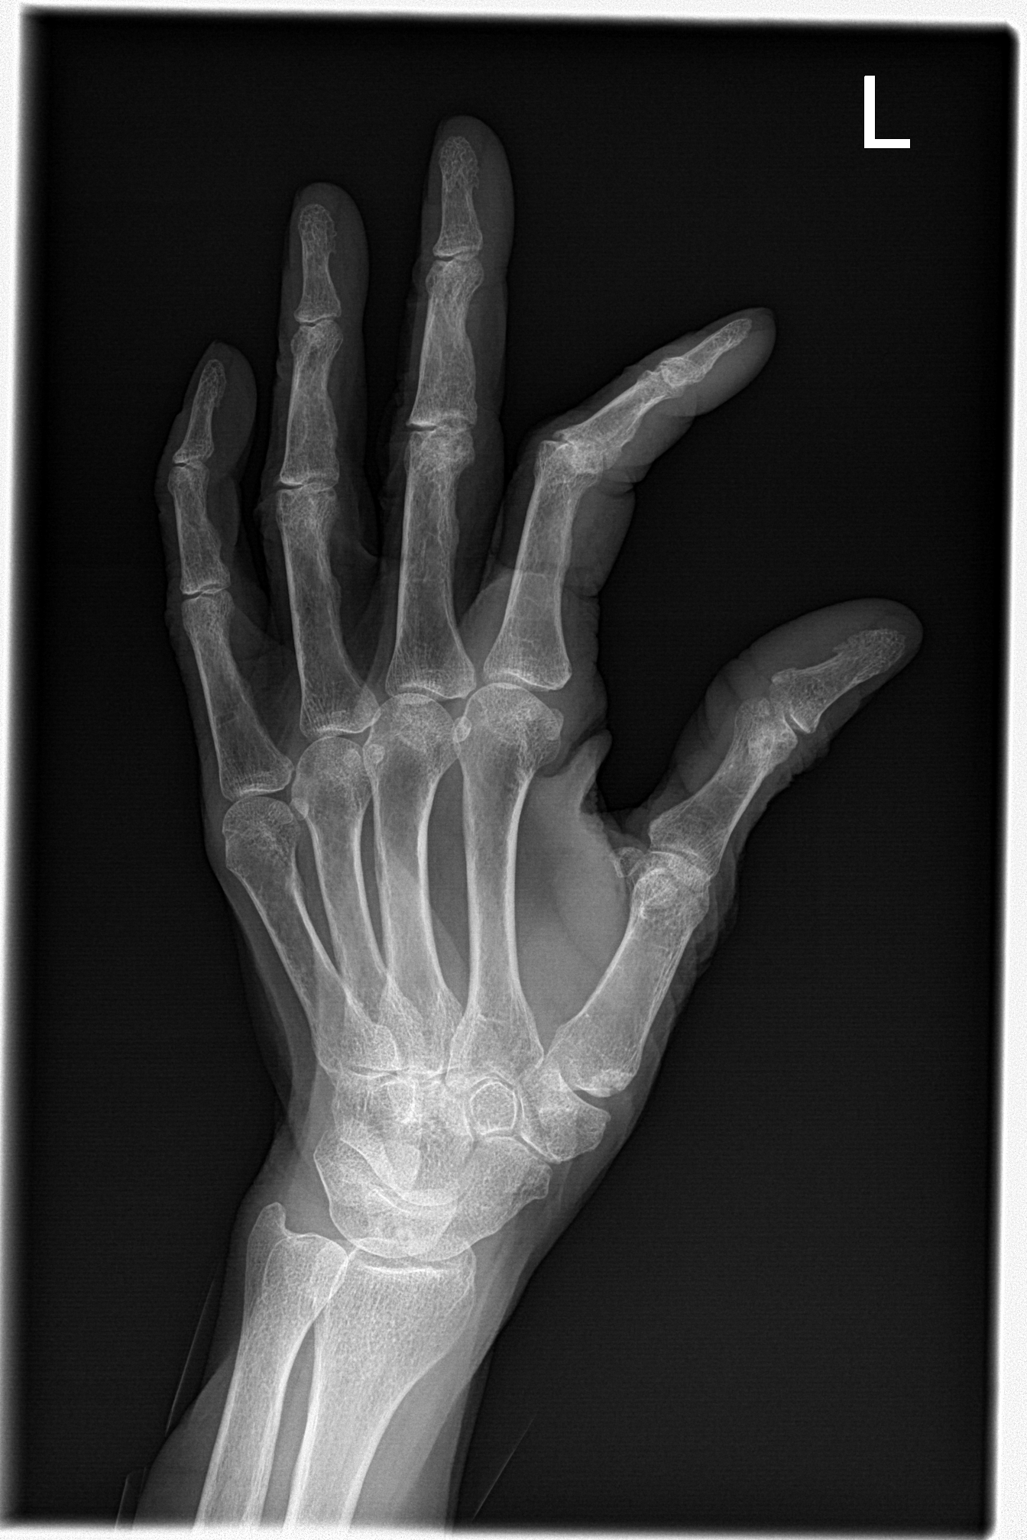

[hand lat]
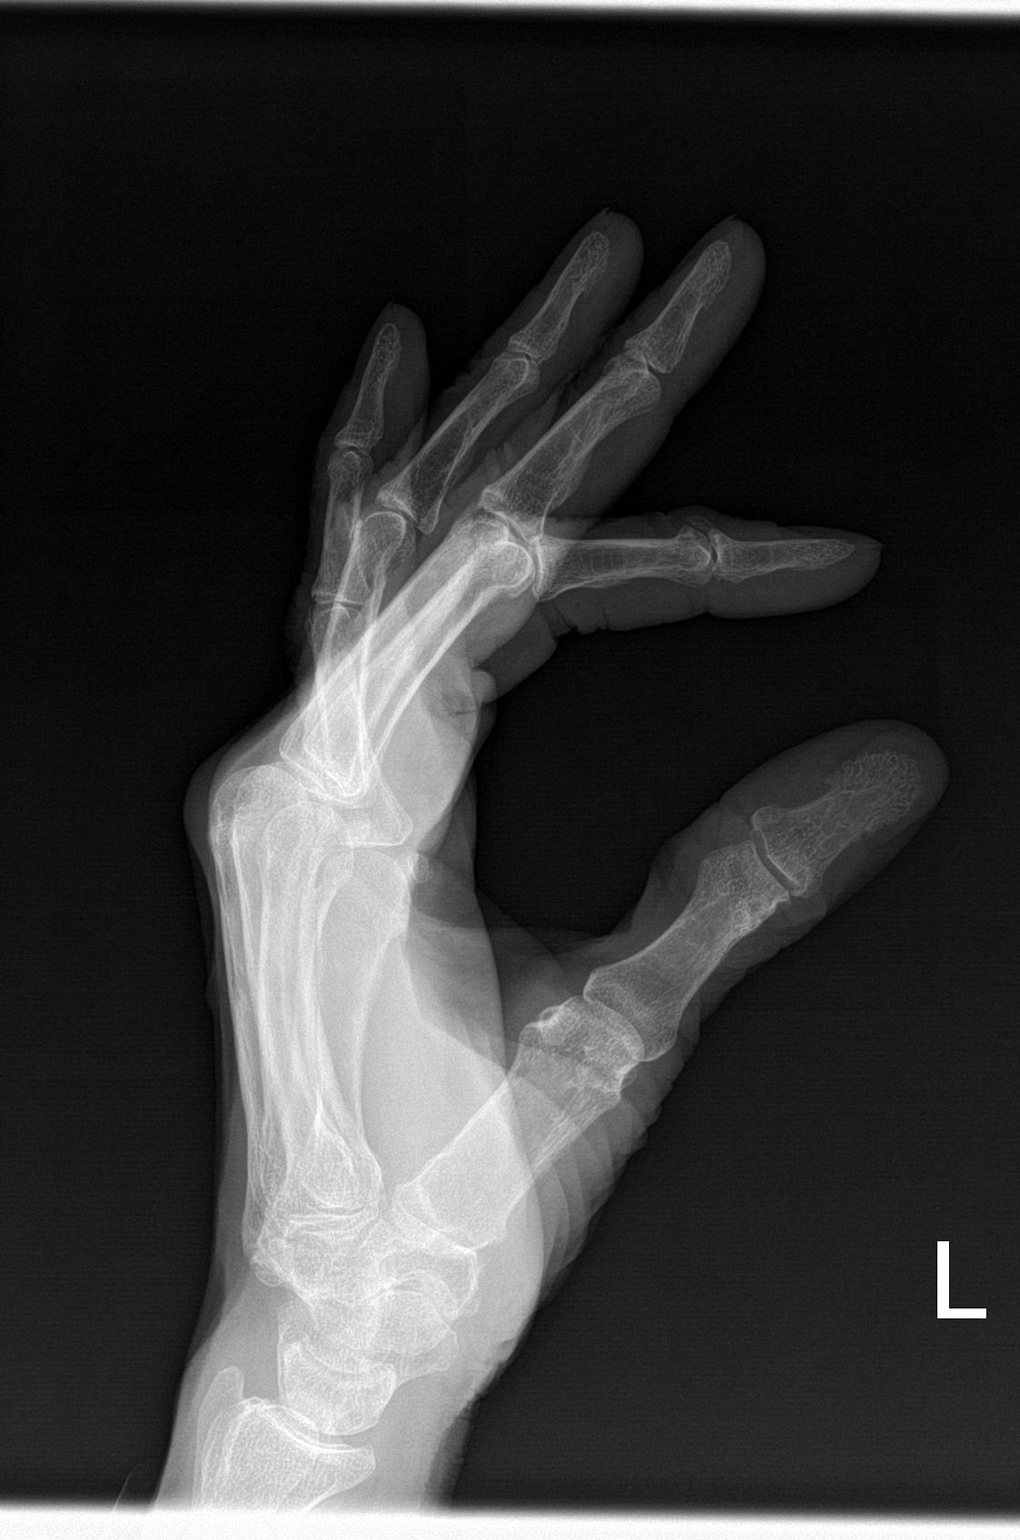

[3 of 3 positions shown; findings below may reference images not displayed]

FINDINGS: There are mild degenerative changes primarily in the first
carpometacarpal joint and the second distal interphalangeal joint.
No acute fracture or subluxation. No suspicious lytic or blastic
lesions. Normal soft tissues.
IMPRESSION: 1. Mild degenerative changes.
2.  No evidence for acute  abnormality.

## 2015-02-24 IMAGING — DX DG SHOULDER 2+V*L*
3 series · 3 of 3 positions shown · non-contrast
Comparison: Chest x-ray 02/07/2011 and 10/21/2012

CLINICAL DATA: Fell.  Pain.  Lateral shoulder pain.

EXAM:
LEFT SHOULDER - 2+ VIEW

[shoulder grashey]
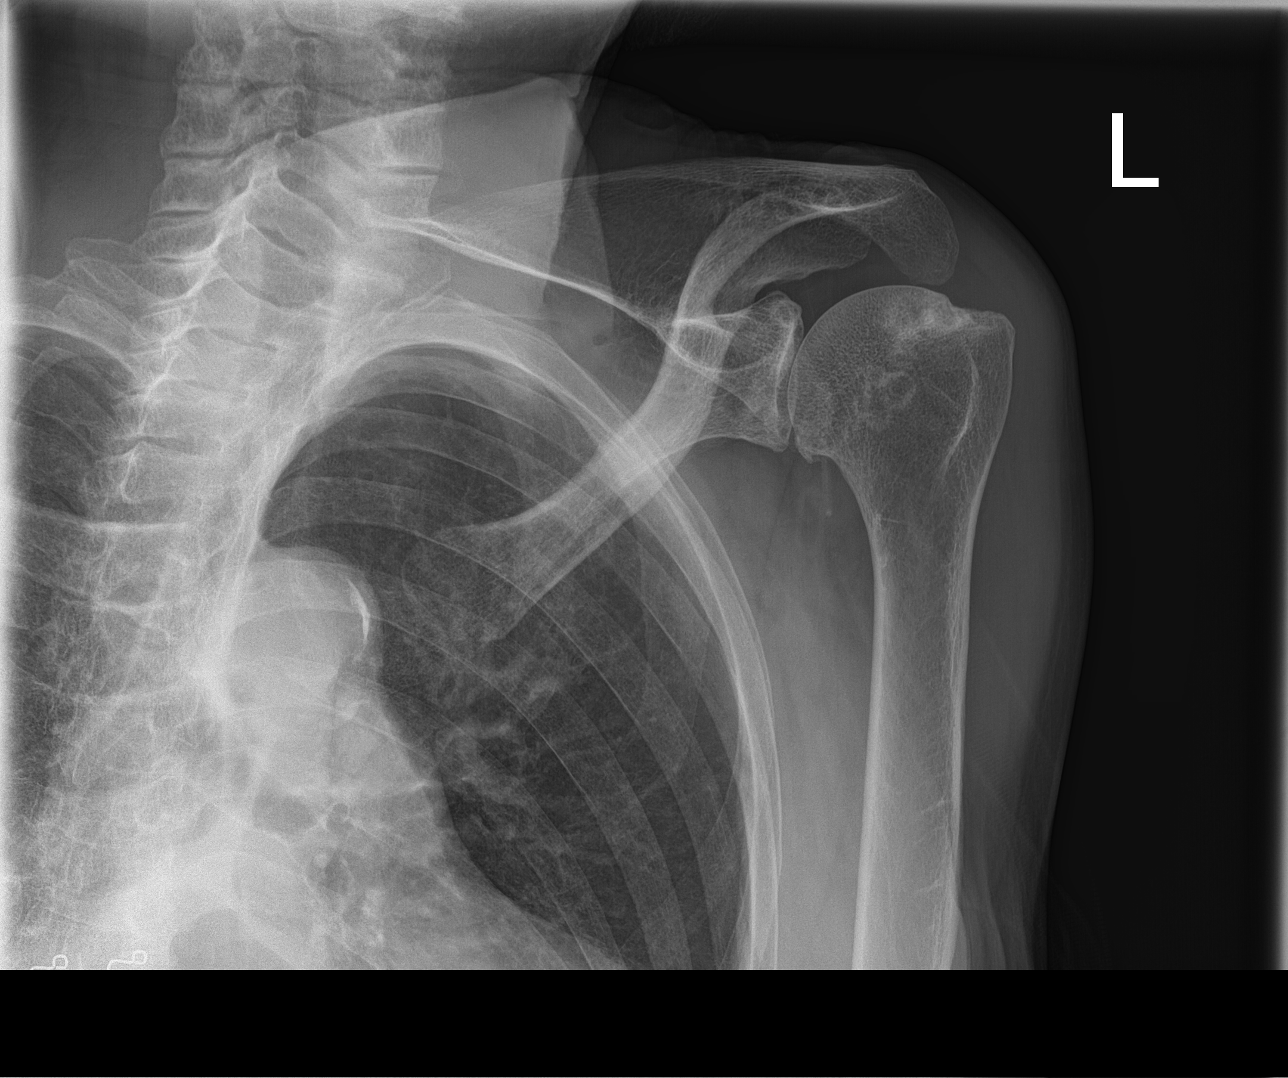

[shoulder y-view]
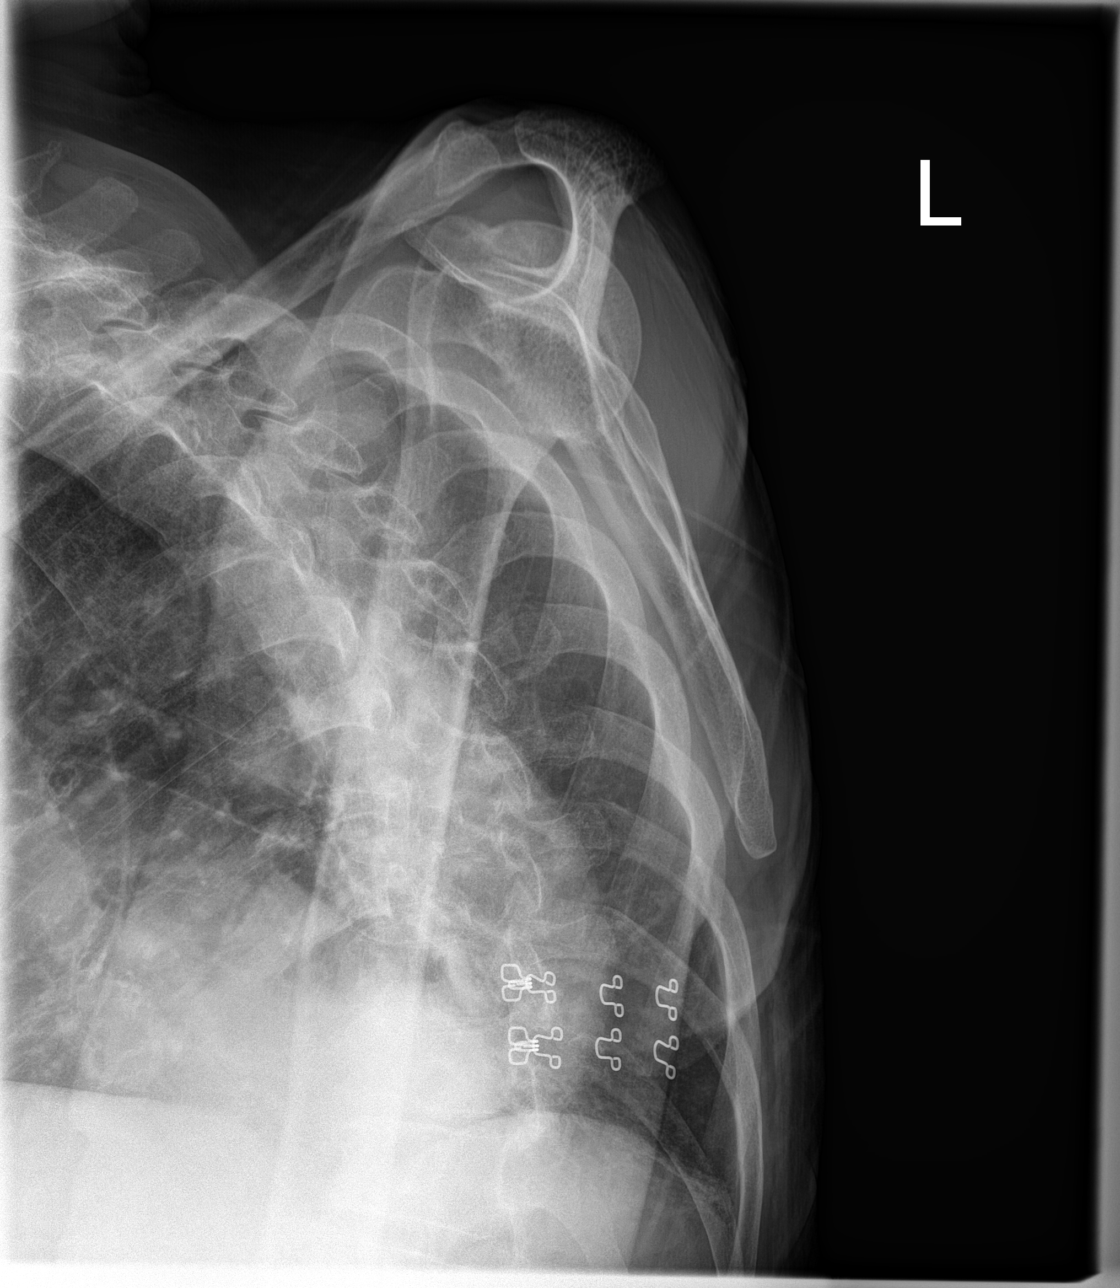

[shoulder axillary]
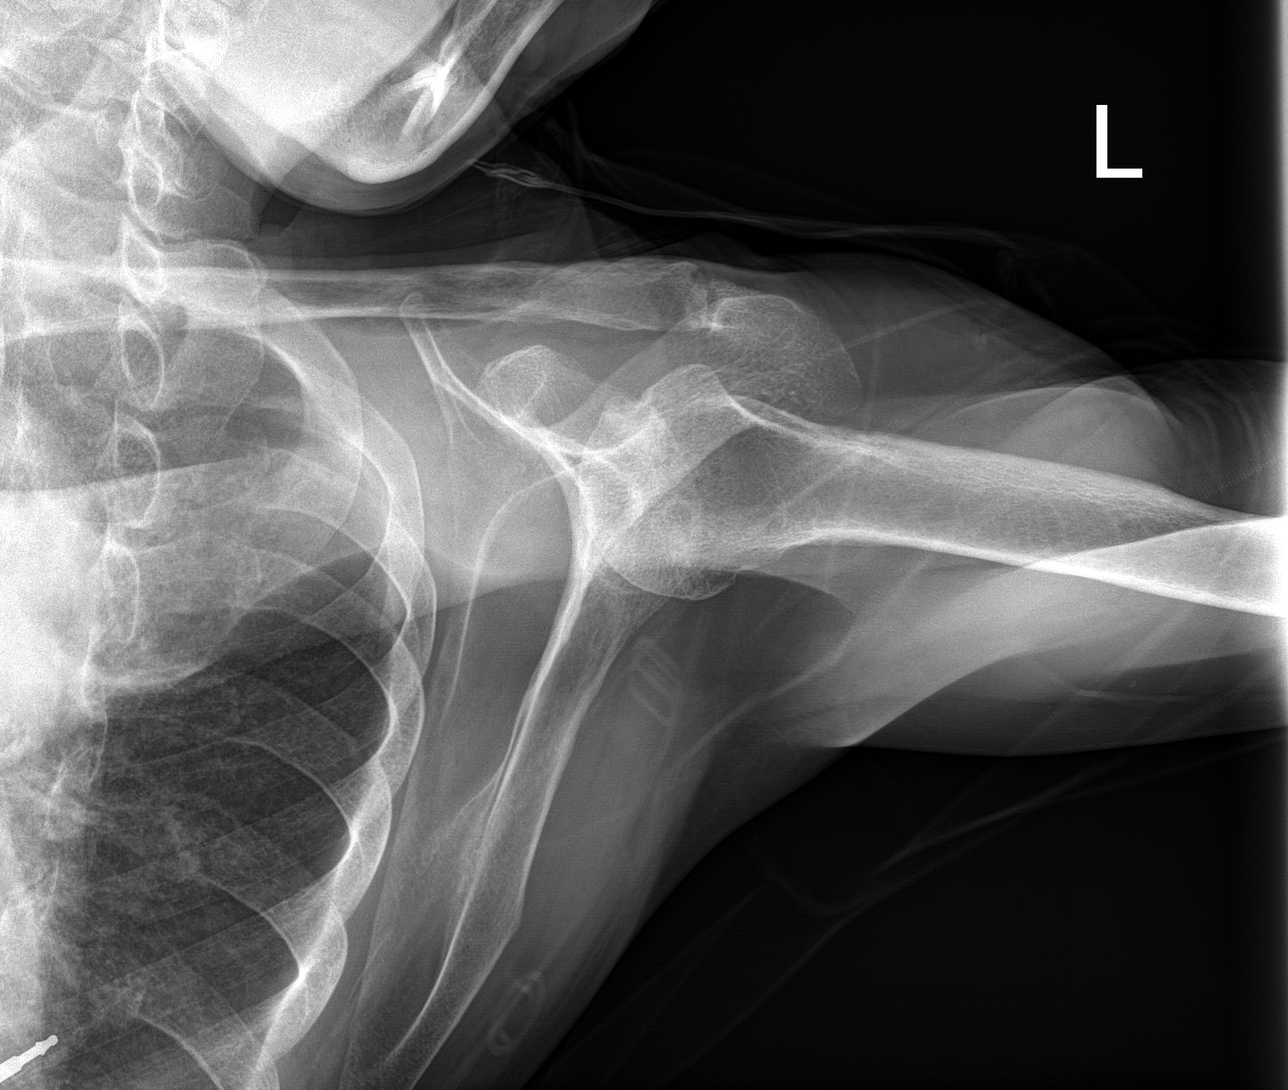

[3 of 3 positions shown; findings below may reference images not displayed]

FINDINGS: There is mild degenerative change in the glenohumeral joint. No
evidence for acute fracture or subluxation. Left lung apex is
unremarkable.
IMPRESSION: No evidence for acute  abnormality.

## 2015-03-30 DIAGNOSIS — F325 Major depressive disorder, single episode, in full remission: Secondary | ICD-10-CM | POA: Diagnosis not present

## 2015-03-30 DIAGNOSIS — M21612 Bunion of left foot: Secondary | ICD-10-CM | POA: Diagnosis not present

## 2015-03-30 DIAGNOSIS — I10 Essential (primary) hypertension: Secondary | ICD-10-CM | POA: Diagnosis not present

## 2015-03-30 DIAGNOSIS — E7800 Pure hypercholesterolemia, unspecified: Secondary | ICD-10-CM | POA: Diagnosis not present

## 2015-03-30 DIAGNOSIS — R51 Headache: Secondary | ICD-10-CM | POA: Diagnosis not present

## 2015-03-30 DIAGNOSIS — M79606 Pain in leg, unspecified: Secondary | ICD-10-CM | POA: Diagnosis not present

## 2015-03-30 DIAGNOSIS — Z23 Encounter for immunization: Secondary | ICD-10-CM | POA: Diagnosis not present

## 2015-03-30 DIAGNOSIS — K000 Anodontia: Secondary | ICD-10-CM | POA: Diagnosis not present

## 2015-03-30 DIAGNOSIS — Z79899 Other long term (current) drug therapy: Secondary | ICD-10-CM | POA: Diagnosis not present

## 2015-05-10 DIAGNOSIS — H25813 Combined forms of age-related cataract, bilateral: Secondary | ICD-10-CM | POA: Diagnosis not present

## 2015-08-01 DIAGNOSIS — E7800 Pure hypercholesterolemia, unspecified: Secondary | ICD-10-CM | POA: Diagnosis not present

## 2015-08-01 DIAGNOSIS — I351 Nonrheumatic aortic (valve) insufficiency: Secondary | ICD-10-CM | POA: Diagnosis not present

## 2015-08-01 DIAGNOSIS — Z0000 Encounter for general adult medical examination without abnormal findings: Secondary | ICD-10-CM | POA: Diagnosis not present

## 2015-08-01 DIAGNOSIS — Z79899 Other long term (current) drug therapy: Secondary | ICD-10-CM | POA: Diagnosis not present

## 2015-08-01 DIAGNOSIS — Z7189 Other specified counseling: Secondary | ICD-10-CM | POA: Diagnosis not present

## 2015-08-01 DIAGNOSIS — I10 Essential (primary) hypertension: Secondary | ICD-10-CM | POA: Diagnosis not present

## 2015-08-01 DIAGNOSIS — F325 Major depressive disorder, single episode, in full remission: Secondary | ICD-10-CM | POA: Diagnosis not present

## 2015-08-29 ENCOUNTER — Other Ambulatory Visit: Payer: Self-pay

## 2015-08-29 VITALS — BP 172/70 | Ht 61.0 in | Wt 118.8 lb

## 2015-08-29 DIAGNOSIS — L299 Pruritus, unspecified: Secondary | ICD-10-CM | POA: Diagnosis not present

## 2015-08-29 DIAGNOSIS — I10 Essential (primary) hypertension: Secondary | ICD-10-CM

## 2015-08-29 DIAGNOSIS — L309 Dermatitis, unspecified: Secondary | ICD-10-CM | POA: Diagnosis not present

## 2015-08-29 NOTE — Patient Outreach (Signed)
[place] [facility]) Care Management  08/29/2015  Molly Neal [birth year] [id number]  Screening  Referral Date:  08/28/15 Source:  Dr. Felipa Eth Issue:  HTN.  Social needs:  Patient needs a SW to help with resources and financial issues.   Outreach call #1 to patient.  Patient reached and screening completed.   Subjective: Patient states she has just returned from an unplanned MD visit to assess a rash and terrible itch she has developed.  States MD provided injection in the office and new prescriptions which she has just picked up.  States much "nervousness" due to the itch.  States she does not know the cause.   Providers: Primary MD: Dr. Felipa Eth -  last appt: 08/29/15   Insurance: Medicare and [place] Medicaid  Social: Patient lives alone.  Patient states she lives below poverty level.    Mobility: ambulates with no assistive devices.  Has a cane and uses if needed.   Falls: none Social Issues:  States she nearly froze during the cold spell.  Heat went off for about 2 weeks.  Patient had to turn on the oven and had issues with the gas and power bills.  Landlord brought an Web designer but this run up the bill.  Qualified for the energy program and completed the form in November 2016.  Received a confirmation letter 2 weeks ago which confirmed some money had been paid on the electric bill but unsure how much and what is owed.   Transportation issues and difficulty getting medications picked up at the pharmacy; having to pay people to transport her to the pharmacy.  [facility].  Owns a car but not driving; needs DMV car title issues and drivers  license has expired.  Has to find and pay friends.  Eye glasses: Per MD, patient has not purchased glasses due to her income.  Caregiver:  None;  Children in [place] but does not see them.  Resources:  Church Consent: yes  DME: cane   THN conditions:   HTN, Osteoarthritis, depression. GERD, Nonrheumatic aortic  valve insufficiency, hypercholesterolemia, allergic rhinitis, DJD of the knees, iron deficiency anemia, diverticulosis, stage II renal disease, large hiatal hernia Weight 118.8 (down from 125 on 03/30/2015) BP 172/70  Medications:  Patient taking about 14-15 medications  Co-pay cost issues: No  Flu Vaccine: 03/30/2015 Pneumonia Vaccine: Prevnar 12/28/2013 and Pneumovax 06/13/2000  Plan:  Referral Date:  08/28/15 Screening:  08/29/2015 [facility] Telephonic RN CM:  08/29/2015 Program:  HTN  THN Social Work Referral -[place] Referral -issues with getting medications picked up at the pharmacy due to transportation issues.  Has to pay for transportation and has limited income.  Please assess for a home delivery system.   RN CM advised in next [facility] scheduled contact call within next 30 days for initial assessment and care coordination services as needed. RN CM advised to please notify MD of any changes in condition prior to scheduled appt's.   RN CM provided contact name and # [phone] or main office # [phone] and 24-hour nurse line # [phone].  RN CM confirmed patient is aware of 911 services for urgent emergency needs. RN CM sent successful outreach letter and  [facility] Introductory package. RN CM notified [place] Management Assistant: agreed to services/case opened.  Mariann Laster, RN, BSN, [facility], CCM  [employer] Management Coordinator [phone] Direct [phone] Cell [phone] Office [phone] Fax

## 2015-08-29 NOTE — Addendum Note (Signed)
Addended by: Standley Brooking on: 08/29/2015 03:41 PM   Modules accepted: Orders

## 2015-09-04 ENCOUNTER — Other Ambulatory Visit: Payer: Self-pay | Admitting: *Deleted

## 2015-09-04 NOTE — Patient Outreach (Signed)
[facility]) Care Management  09/04/2015  Molly Neal [birth year] [id number]   CSW received a new referral on patient from Mariann Laster, Telephonic Nurse Case Manager with [place] Management, indicated that patient would benefit from social work services and resources to assist with referrals to various community agencies.  Mrs. Hutchinson requested that CSW complete a Financial Assessment Form on patient to determine whether or not patient would qualify for any type of financial assistance through [place] Management.  Patient is an Adult Medicaid recipient through the [place], therefore, patient is already eligible to receive financial assistance through [place] Management.  CSW sent a request to Freda Jackson, as well as Josepha Pigg, both Care Management Assistant's with [place] Management, to mail resource information to patient's home for her review.  Mr. Evelene Croon will mail patient information pertaining to transportation resources, such as Medicaid Transportation/Taxi (through the [place]), Armed forces technical officer through [employer] of [employer] and [employer] (Specialized [employer]), also  through the [place].  Mrs. Quentin Cornwall will mail patient information pertaining to vision ware and make a referral to the [employer]. CSW made an initial attempt to try and contact patient today to perform phone assessment, as well as assess and assist with social needs and services, without success.  A HIPAA complaint message was left for patient on voicemail.  CSW is currently awaiting a return call.  [name], BSW, MSW, LCSW  Licensed Education officer, environmental Health System  Mailing [place] [address] [place], [place]  [address] Physical Address-[address], [place], [place] [address] Toll Free Main # [phone] Fax # [phone] Cell # [phone]  Fax # [phone]  [name]  Patient's preferred language:  [place]   English  ATTENTION:  If you speak English, language assistance services, free of charge, are available to you.    Nondiscrimination and Accessibility Statement: Discrimination is Against the [employer], a subsidiary of [employer], complies with [employer] civil rights laws and does not discriminate on the basis of race, color, national origin, age, disability, or sex.  [place] does not exclude people or treat them differently because of race, color, national origin, age, disability, or sex.  [place] Providers will:  . Provide free aids and services to people with disabilities to communicate effectively with [place], such as:     ? Qualified sign language interpreters  ? Written information in other formats (large print, audio, accessible electronic formats, other formats)   . Provide free language services to people whose primary language is not [place], such as:    ? Qualified interpreters    ? Information written in other languages   If you need these services, contact your [facility] Forensic psychologist.  If you believe that a [employer] [employer] has failed to provide these services or discriminated in another way on the basis of race, color, national origin, age, disability, or sex, you can file a Tourist information centre manager with: [place], [phone] or [email or web address]  You can file a grievance in person or by mail, fax, or email. If you need help filing a grievance, you may contact Valrie Hart, Interim Compliance Officer, [facility] Department of Compliance and Integrity, [address] 2nd Floor, [place],  [place]. [place], [phone], [email or web address].  You can also file a civil rights complaint with the U.S. Department of Health and Financial controller, Office for [employer], electronically through the Office for Civil Rights Complaint Portal, available at OnSiteLending.nl.jsf, or by mail or phone at:  [place]. Department of Health and Human Services [address], [place] Room [phone], [facility] Building [place], [place]  [phone], [phone] (TDD) Complaint forms are available at [email or web address].

## 2015-09-08 ENCOUNTER — Other Ambulatory Visit: Payer: Self-pay | Admitting: Pharmacist

## 2015-09-08 NOTE — Patient Outreach (Signed)
[facility]) Care Management  09/08/2015  Molly Neal May 18, 1932 [id number]   Patient was referred to [place] by Rocky Link, [facility] RN Telephonic due to patient reported difficulty with transportation to pharmacy to obtain medications.   Pharmacist placed call to patient and patient answered, verified name and date of birth.  Pharmacist explained purpose of call.    Patient was appreciated of call but wishes to call back because she was about to head out the door.    Patient provided with [facility] Pharmacist phone number and will await patient call back.    Karrie Meres, PharmD, [place] [phone]

## 2015-09-11 ENCOUNTER — Other Ambulatory Visit: Payer: Self-pay | Admitting: Pharmacist

## 2015-09-11 NOTE — Patient Outreach (Signed)
[place] [facility]) Care Management  [place]   09/11/2015  Molly Neal [birth year] [id number]  Subjective:  Patient was referred to [place] by Rocky Link, [facility] RN Telephonic regarding difficulty obtaining her medications from her local pharmacy.   Pharmacist called patient and patient verified name and date of birth, pharmacist explained purpose of call.  Patient reports [facility] and has to pay someone to get her to the pharmacy to pick up her prescriptions.  She does report that she has her prescriptions at this time and that she stopped alendronate and atorvastatin on her own.    She reports that she experienced muscle aches/pains with atorvastatin and isn't sure what other cholesterol lowering medications she has used in the past.    Patient also reports that her medications come due for refills at different times as well, and this makes it difficult to go to the pharmacy multiple times each month.    Objective:   Current Medications: Current Outpatient Prescriptions  Medication Sig Dispense Refill  . alendronate (FOSAMAX) 70 MG tablet Take 70 mg by mouth once a week. Take with a full glass of water on an empty stomach.    Marland Kitchen amLODipine (NORVASC) 10 MG tablet Take 10 mg by mouth daily.  6  . atorvastatin (LIPITOR) 10 MG tablet Take 10 mg by mouth daily.  6  . benazepril (LOTENSIN) 40 MG tablet Take 40 mg by mouth daily.  11  . esomeprazole (NEXIUM) 40 MG capsule Take one pill daily 30 capsule 0  . HYDROcodone-acetaminophen (NORCO/VICODIN) 5-325 MG per tablet Take by mouth every 8 (eight) hours as needed. For 30 days  0  . LORazepam (ATIVAN) 0.5 MG tablet Take 0.5 mg by mouth 2 (two) times daily as needed.  2  . meloxicam (MOBIC) 15 MG tablet Take 15 mg by mouth daily.  3  . metoprolol succinate (TOPROL-XL) 50 MG 24 hr tablet Take 50 mg by mouth daily.  6  . sertraline (ZOLOFT) 50 MG tablet Take 50 mg by mouth  daily.   10  . traMADol (ULTRAM) 50 MG tablet Take 1 tablet (50 mg total) by mouth every 6 (six) hours as needed. 15 tablet 0   No current facility-administered medications for this visit.    Functional Status: In your present state of health, do you have any difficulty performing the following activities: 08/29/2015  Hearing? N  Vision? Y  Difficulty concentrating or making decisions? N  Walking or climbing stairs? N  Dressing or bathing? N  Doing errands, shopping? N  Preparing Food and eating ? N  Using the Toilet? N  In the past six months, have you accidently leaked urine? N  Do you have problems with loss of bowel control? N  Managing your Medications? N  Managing your Finances? N  Housekeeping or managing your Housekeeping? N    Fall/Depression Screening: PHQ 2/9 Scores 08/29/2015  PHQ - 2 Score 0    Assessment:  Medication access:  Discussed that patient could consider switching her prescriptions to a different pharmacy which provides delivery service of prescriptions.  Patient reports she [facility]   Discussed that patient could consider obtaining a 90 day supply of her maintenance medications to allow for less frequent refills of these medications.    Statin therapy:  Consideration could be given to trying an alternative statin agent, or a lower dose atorvastatin, to see if patient  is able to better tolerate statin therapy.    Plan:  1) Will send initial assessment and barriers letter to Dr Felipa Eth that patient is interested in receiving maintenance prescriptions written for 90 day supply.   2) Will follow-up with patient via phone in about 2 weeks regarding her 90 day prescription status.    Karrie Meres, PharmD, [place] [phone]

## 2015-09-12 ENCOUNTER — Encounter: Payer: Self-pay | Admitting: Pharmacist

## 2015-09-12 ENCOUNTER — Ambulatory Visit: Payer: Self-pay

## 2015-09-13 ENCOUNTER — Other Ambulatory Visit: Payer: Self-pay | Admitting: *Deleted

## 2015-09-13 ENCOUNTER — Encounter: Payer: Self-pay | Admitting: *Deleted

## 2015-09-13 NOTE — Patient Outreach (Signed)
[facility]) Care Management  09/13/2015  Molly Neal 1932/02/25 [id number]  CSW made a second attempt to try and contact patient today to perform phone assessment, as well as assess and assist with social needs and services, without success.  A HIPAA complaint message was left for patient on voicemail.  CSW is currently awaiting a return call.  [name], BSW, MSW, LCSW  Licensed Education officer, environmental Health System  Mailing [place] [address], [place], [place] [address] Physical Address-[address], [place], [place] [address] Toll Free Main # [phone] Fax # [phone] Cell # [phone]  Fax # [phone]  [name]  Patient's preferred language:  [place]   English  ATTENTION:  If you speak [place], language assistance services, free of charge, are available to you.    Nondiscrimination and Accessibility Statement: Discrimination is Against the [employer]V, a subsidiary of [employer], complies with [employer] civil rights laws and does not discriminate on the basis of race, color, national origin, age, disability, or sex.  [name] does not exclude people or treat them differently because of race, color, national origin, age, disability, or sex.  [place] Providers will:  . Provide free aids and services to people with disabilities to communicate effectively with [place], such as:     ? Qualified sign language interpreters  ? Written information in other formats (large print, audio, accessible electronic formats, other formats)   . Provide free language services to people whose primary language is not [place], such as:    ? Qualified interpreters    ? Information written in other languages   If you need these services, contact your [facility] Forensic psychologist.  If you believe that a [facility]  [employer] has failed to provide these services or discriminated in another way on the basis of race, color, national origin, age, disability, or sex, you can file a Tourist information centre manager with: [place], [phone] or [email or web address]  You can file a grievance in person or by mail, fax, or email. If you need help filing a grievance, you may contact Valrie Hart, Interim Compliance Officer, [facility] Department of Compliance and Integrity, [place] 2nd Floor, [place], [place]. [place], [phone], [email or web address].    You can also file a civil rights complaint with the U.S. Department of Health and Financial controller, Office for [employer], electronically through the Office for Civil Rights Complaint Portal, available at OnSiteLending.nl.jsf, or by mail or phone at:  [place]. Department of Health and Human Services [address] [place] Room [phone], [facility] Building [place], [place]  [phone], [phone] (TDD) Complaint forms are available at CutFunds.[email or web address].

## 2015-09-20 ENCOUNTER — Ambulatory Visit: Payer: Self-pay

## 2015-09-21 ENCOUNTER — Other Ambulatory Visit: Payer: Self-pay | Admitting: *Deleted

## 2015-09-21 ENCOUNTER — Encounter: Payer: Self-pay | Admitting: *Deleted

## 2015-09-21 ENCOUNTER — Ambulatory Visit: Payer: Self-pay

## 2015-09-21 NOTE — Patient Outreach (Signed)
[facility]) Care Management  09/21/2015  JAMICIA HAALAND [birth year] [id number]   CSW was able to make initial contact with patient today to perform phone assessment, as well as assess and assist with social work needs and services.  CSW introduced self, explained role and types of services provided through [place] Management ([place] Management).  CSW further explained to patient that CSW works with patient's RNCM, also with [place] Management, Crystal Hutchinson. CSW then explained the reason for the call, indicating that Mrs. Hutchinson thought that patient would benefit from social work services and resources to assist with transportation resources.  CSW obtained two HIPAA compliant identifiers from patient, which included patient's name and date of birth. Patient is an Adult Medicaid recipient through the [place]; therefore, patient is already eligible to receive financial assistance through [place] Management. CSW was able to confirm with patient that she received the packet of resource information mailed to her home by Freda Jackson, as well as Josepha Pigg, both Care Management Assistant's with [place] Management.  The packet of information included all of the following resources:  Medicaid Transportation/Taxi (through the [place]), Armed forces technical officer through [employer] and [employer] (Engineering geologist), through the [employer].  Patient also received information pertaining to vision ware and referral was made on patient's behalf to the [place]. CSW will perform a case closure on patient, as all goals of treatment have been met from social work standpoint and no additional social work needs have been identified at this time. CSW will notify patient's RNCM with [place] Management, Crystal Hutchinson of CSW's plans to close patient's case. CSW will fax a correspondence letter to patient's Primary Care Physician, Dr. Lajean Manes to ensure that Dr. Felipa Eth is aware of CSW's case closure plans.   CSW will submit a case closure request to Lurline Del, Care Management Assistant with [place] Management, in the form of an [employer].  CSW will ensure that Mrs. Laurance Flatten is aware of Crystal Hutchinson's, RNCM with [place] Management, continued involvement with patient's care.  [name], BSW, MSW, LCSW  Licensed Education officer, environmental Health System  Mailing [place] [address] [place], [place] [address] Physical Address-[address], [place], [place] [address] Toll Free Main # [phone] Fax # [phone] Cell # [phone]  Fax # [phone]  [name]  Patient's preferred language:  [place]   English  ATTENTION:  If you speak [place], language assistance services, free of charge, are available to you.    Nondiscrimination and Accessibility Statement: Discrimination is Against the [employer]V, a subsidiary of [employer], complies with [employer] civil rights laws and does not discriminate on the basis of race, color, national origin, age, disability, or sex.  [place] does not exclude people or treat them differently because of race, color, national origin, age, disability, or sex.  [place] Providers will:  . Provide free aids and services to people with disabilities to communicate effectively with [place], such as:     ? Qualified sign language interpreters  ? Written information in other formats (large print, audio, accessible electronic formats, other formats)   . Provide free language services to people whose primary language is not [place],  such as:    ?  Qualified interpreters    ? Information written in other languages   If you need these services, contact your [facility] Forensic psychologist.  If you believe that a [facility] [employer] has failed to provide these services or discriminated in another way on the basis of race, color, national origin, age, disability, or sex, you can file a Tourist information centre manager with: [place], [phone] or [email or web address]  You can file a grievance in person or by mail, fax, or email. If you need help filing a grievance, you may contact Valrie Hart, Interim Compliance Officer, [facility] Department of Compliance and Integrity, [address] 2nd Floor, [place], [place]. [place], [phone], [email or web address].    You can also file a civil rights complaint with the U.S. Department of Health and Financial controller, Office for [employer], electronically through the Office for Civil Rights Complaint Portal, available at OnSiteLending.nl.jsf, or by mail or phone at:  [place]. Department of Health and Human Services [address], [place] Room [phone], [facility] Building [place], [place]  [phone], [phone] (TDD) Complaint forms are available at CutFunds.[email or web address].

## 2015-09-22 ENCOUNTER — Ambulatory Visit: Payer: Self-pay

## 2015-09-25 ENCOUNTER — Other Ambulatory Visit: Payer: Self-pay

## 2015-09-25 NOTE — Patient Outreach (Signed)
[facility]) Care Management  09/25/2015  Molly Neal January 02, 1932 [id number]   Telephonic Initial Assessment   Outreach call #1 to patient.  Patient not reached.  RN CM left HIPAA compliant voice message with name and number for call back.  RN CM scheduled for next contact call within one week.    Mariann Laster, RN, BSN, [facility], CCM  [employer] Management Coordinator [phone] Direct [phone] Cell [phone] Office [phone] Fax

## 2015-09-26 ENCOUNTER — Other Ambulatory Visit: Payer: Self-pay | Admitting: Pharmacist

## 2015-09-26 NOTE — Patient Outreach (Signed)
[place] [facility]) Care Management  09/26/2015  NORMANDY ZITTEL Dec 09, 1931 [id number]  [facility] Pharmacist made an unsuccessful attempt to reach patient today to follow-up on her medication needs.   No answer, so a HIPAA compliant message was left requesting a call back.   Plan:  Will make another attempt to reach patient within the next week if no return call from patient.   Karrie Meres, PharmD, [place] [phone]

## 2015-09-27 ENCOUNTER — Other Ambulatory Visit: Payer: Self-pay

## 2015-09-27 NOTE — Patient Outreach (Signed)
[place] [facility]) Care Management  09/27/2015  MARGI MAGOS [birth year] [id number]  Telephonic Initial Assessment   Outreach call #2 to patient.  Patient not reached.  RN CM left HIPAA compliant voice message with name and number for call back.  RN CM scheduled for next contact call within one week.   Mariann Laster, RN, BSN, [facility], CCM  [employer] Management Coordinator [phone] Direct [phone] Cell [phone] Office [phone] Fax

## 2015-09-29 ENCOUNTER — Other Ambulatory Visit: Payer: Self-pay

## 2015-09-29 NOTE — Patient Outreach (Signed)
[facility]) Care Management  09/29/2015  Molly Neal Aug 20, 1931 [id number]   Telephonic Initial Assessment   Outreach call #3 to patient.  Patient not reached.  RN CM left HIPAA compliant voice message with name and number for call back.  RN CM mailed unsuccessful outreach letter.   RN CM will review for response over the next 2 weeks.   Mariann Laster, RN, BSN, [facility], CCM  [employer] Management Coordinator [phone] Direct [phone] Cell [phone] Office [phone] Fax

## 2015-10-02 ENCOUNTER — Other Ambulatory Visit: Payer: Self-pay | Admitting: Pharmacist

## 2015-10-02 NOTE — Patient Outreach (Signed)
[place] [facility]) Care Management  10/02/2015  Molly Neal 10-27-1931 [id number]  [facility] Pharmacist placed phone call to patient to follow-up her medication needs.  Patient verified her name and date of birth.    She reports that she has been busy and that she hasn't returned calls to [place], [facility] RN Telephonic.  Patient reports she has no further medication questions at this time, she denies medication affordability issues and reports she is still planning to speak with Dr Felipa Eth about getting 90 day supplies at her next appointment with PCP.    Plan:  Encouraged patient to call back [place].  Ensured patient has pharmacist phone number.    Will close out pharmacy case at this time as patient denies other medication needs.  Happy to assist should new pharmacy needs arise in the future.   Karrie Meres, PharmD, [place] [phone]

## 2015-10-23 ENCOUNTER — Other Ambulatory Visit: Payer: Self-pay

## 2015-10-23 NOTE — Patient Outreach (Signed)
[facility]) Care Management  10/23/2015  Molly Neal June 23, 1931 [id number]   Case Closure  Unsuccessful outreach to patient following several phone attempts and unsuccessful outreach letter.  H/o THN RN CM Screening, SW and Pharm referrals completed.  RN CM unable to reach patient for Initial Assessemtn since last contact on 08/29/2015 for screening.    RN CM notified THN of case closure.  RN CM notified Dr. Felipa Eth via case closure letter.  RN CM notified patient via case closure letter.    Mariann Laster, RN, BSN, [facility], CCM  [employer] Management Coordinator [phone] Direct [phone] Cell [phone] Office [phone] Fax

## 2015-11-17 DIAGNOSIS — M810 Age-related osteoporosis without current pathological fracture: Secondary | ICD-10-CM | POA: Diagnosis not present

## 2015-11-17 DIAGNOSIS — W57XXXA Bitten or stung by nonvenomous insect and other nonvenomous arthropods, initial encounter: Secondary | ICD-10-CM | POA: Diagnosis not present

## 2015-11-17 DIAGNOSIS — I10 Essential (primary) hypertension: Secondary | ICD-10-CM | POA: Diagnosis not present

## 2015-11-17 DIAGNOSIS — R220 Localized swelling, mass and lump, head: Secondary | ICD-10-CM | POA: Diagnosis not present

## 2016-03-05 DIAGNOSIS — M25561 Pain in right knee: Secondary | ICD-10-CM | POA: Diagnosis not present

## 2016-03-05 DIAGNOSIS — R1906 Epigastric swelling, mass or lump: Secondary | ICD-10-CM | POA: Diagnosis not present

## 2016-03-05 DIAGNOSIS — M25562 Pain in left knee: Secondary | ICD-10-CM | POA: Diagnosis not present

## 2016-03-05 DIAGNOSIS — Z79899 Other long term (current) drug therapy: Secondary | ICD-10-CM | POA: Diagnosis not present

## 2016-03-05 DIAGNOSIS — G8929 Other chronic pain: Secondary | ICD-10-CM | POA: Diagnosis not present

## 2016-03-05 DIAGNOSIS — Z23 Encounter for immunization: Secondary | ICD-10-CM | POA: Diagnosis not present

## 2016-03-05 DIAGNOSIS — I10 Essential (primary) hypertension: Secondary | ICD-10-CM | POA: Diagnosis not present

## 2016-03-05 DIAGNOSIS — K219 Gastro-esophageal reflux disease without esophagitis: Secondary | ICD-10-CM | POA: Diagnosis not present

## 2016-03-07 DIAGNOSIS — M170 Bilateral primary osteoarthritis of knee: Secondary | ICD-10-CM | POA: Diagnosis not present

## 2016-05-27 ENCOUNTER — Other Ambulatory Visit (HOSPITAL_COMMUNITY): Payer: Self-pay | Admitting: Orthopedic Surgery

## 2016-05-27 DIAGNOSIS — R52 Pain, unspecified: Secondary | ICD-10-CM

## 2016-05-27 DIAGNOSIS — S93602A Unspecified sprain of left foot, initial encounter: Secondary | ICD-10-CM | POA: Diagnosis not present

## 2016-05-27 DIAGNOSIS — M79605 Pain in left leg: Secondary | ICD-10-CM

## 2016-05-27 DIAGNOSIS — M7989 Other specified soft tissue disorders: Principal | ICD-10-CM

## 2016-05-28 ENCOUNTER — Encounter (HOSPITAL_COMMUNITY): Payer: Self-pay

## 2016-05-28 ENCOUNTER — Other Ambulatory Visit (HOSPITAL_COMMUNITY): Payer: Self-pay | Admitting: Orthopedic Surgery

## 2016-05-28 ENCOUNTER — Ambulatory Visit (HOSPITAL_COMMUNITY)
Admission: RE | Admit: 2016-05-28 | Discharge: 2016-05-28 | Disposition: A | Discharge status: Home / Self Care | Payer: Medicare Other | Source: Ambulatory Visit | Attending: Geriatric Medicine | Admitting: Geriatric Medicine

## 2016-05-28 DIAGNOSIS — R52 Pain, unspecified: Secondary | ICD-10-CM

## 2016-05-28 DIAGNOSIS — M7989 Other specified soft tissue disorders: Principal | ICD-10-CM

## 2016-05-28 DIAGNOSIS — M79605 Pain in left leg: Secondary | ICD-10-CM | POA: Diagnosis not present

## 2016-05-28 NOTE — Progress Notes (Addendum)
*  Preliminary Results* Left lower extremity venous duplex completed. Left lower extremity is negative for deep vein thrombosis. There is evidence of left Baker's cyst.  05/28/2016 1:40 PM  Maudry Mayhew, BS, RVT, RDCS, RDMS

## 2016-06-12 DIAGNOSIS — M1990 Unspecified osteoarthritis, unspecified site: Secondary | ICD-10-CM | POA: Diagnosis not present

## 2016-06-12 DIAGNOSIS — I10 Essential (primary) hypertension: Secondary | ICD-10-CM | POA: Diagnosis not present

## 2016-07-01 DIAGNOSIS — S93602D Unspecified sprain of left foot, subsequent encounter: Secondary | ICD-10-CM | POA: Diagnosis not present

## 2016-08-02 DIAGNOSIS — F325 Major depressive disorder, single episode, in full remission: Secondary | ICD-10-CM | POA: Diagnosis not present

## 2016-08-02 DIAGNOSIS — I10 Essential (primary) hypertension: Secondary | ICD-10-CM | POA: Diagnosis not present

## 2016-08-02 DIAGNOSIS — Z0000 Encounter for general adult medical examination without abnormal findings: Secondary | ICD-10-CM | POA: Diagnosis not present

## 2016-08-02 DIAGNOSIS — M1990 Unspecified osteoarthritis, unspecified site: Secondary | ICD-10-CM | POA: Diagnosis not present

## 2016-08-02 DIAGNOSIS — Z1389 Encounter for screening for other disorder: Secondary | ICD-10-CM | POA: Diagnosis not present

## 2016-08-02 DIAGNOSIS — Z7189 Other specified counseling: Secondary | ICD-10-CM | POA: Diagnosis not present

## 2016-08-02 DIAGNOSIS — Z79899 Other long term (current) drug therapy: Secondary | ICD-10-CM | POA: Diagnosis not present

## 2016-08-27 DIAGNOSIS — H25813 Combined forms of age-related cataract, bilateral: Secondary | ICD-10-CM | POA: Diagnosis not present

## 2016-08-27 DIAGNOSIS — H43812 Vitreous degeneration, left eye: Secondary | ICD-10-CM | POA: Diagnosis not present

## 2016-08-28 DIAGNOSIS — Z79899 Other long term (current) drug therapy: Secondary | ICD-10-CM | POA: Diagnosis not present

## 2016-09-02 ENCOUNTER — Other Ambulatory Visit (HOSPITAL_COMMUNITY): Payer: Self-pay

## 2016-09-13 ENCOUNTER — Inpatient Hospital Stay: Admit: 2016-09-13 | Payer: Medicare Other | Admitting: Orthopedic Surgery

## 2016-09-13 SURGERY — ARTHROPLASTY, KNEE, TOTAL
Anesthesia: General | Laterality: Left

## 2016-10-30 DIAGNOSIS — M1990 Unspecified osteoarthritis, unspecified site: Secondary | ICD-10-CM | POA: Diagnosis not present

## 2016-10-30 DIAGNOSIS — G894 Chronic pain syndrome: Secondary | ICD-10-CM | POA: Diagnosis not present

## 2016-10-30 DIAGNOSIS — I872 Venous insufficiency (chronic) (peripheral): Secondary | ICD-10-CM | POA: Diagnosis not present

## 2016-10-30 DIAGNOSIS — I10 Essential (primary) hypertension: Secondary | ICD-10-CM | POA: Diagnosis not present

## 2016-12-04 ENCOUNTER — Other Ambulatory Visit: Payer: Self-pay | Admitting: Geriatric Medicine

## 2016-12-04 DIAGNOSIS — M1712 Unilateral primary osteoarthritis, left knee: Secondary | ICD-10-CM | POA: Diagnosis not present

## 2016-12-04 DIAGNOSIS — I351 Nonrheumatic aortic (valve) insufficiency: Secondary | ICD-10-CM | POA: Diagnosis not present

## 2016-12-04 DIAGNOSIS — I10 Essential (primary) hypertension: Secondary | ICD-10-CM | POA: Diagnosis not present

## 2016-12-04 DIAGNOSIS — Z79899 Other long term (current) drug therapy: Secondary | ICD-10-CM | POA: Diagnosis not present

## 2016-12-04 DIAGNOSIS — I872 Venous insufficiency (chronic) (peripheral): Secondary | ICD-10-CM | POA: Diagnosis not present

## 2016-12-06 DIAGNOSIS — M7989 Other specified soft tissue disorders: Secondary | ICD-10-CM | POA: Diagnosis not present

## 2016-12-06 DIAGNOSIS — L539 Erythematous condition, unspecified: Secondary | ICD-10-CM | POA: Diagnosis not present

## 2016-12-06 DIAGNOSIS — W57XXXA Bitten or stung by nonvenomous insect and other nonvenomous arthropods, initial encounter: Secondary | ICD-10-CM | POA: Diagnosis not present

## 2016-12-11 ENCOUNTER — Other Ambulatory Visit (HOSPITAL_COMMUNITY): Payer: Self-pay

## 2016-12-17 ENCOUNTER — Other Ambulatory Visit: Payer: Self-pay

## 2016-12-17 ENCOUNTER — Ambulatory Visit (HOSPITAL_COMMUNITY): Payer: Medicare Other | Attending: Cardiology

## 2016-12-17 DIAGNOSIS — I517 Cardiomegaly: Secondary | ICD-10-CM | POA: Insufficient documentation

## 2016-12-17 DIAGNOSIS — I361 Nonrheumatic tricuspid (valve) insufficiency: Secondary | ICD-10-CM | POA: Diagnosis not present

## 2016-12-17 DIAGNOSIS — I5189 Other ill-defined heart diseases: Secondary | ICD-10-CM | POA: Insufficient documentation

## 2016-12-17 DIAGNOSIS — I351 Nonrheumatic aortic (valve) insufficiency: Secondary | ICD-10-CM

## 2016-12-17 DIAGNOSIS — I340 Nonrheumatic mitral (valve) insufficiency: Secondary | ICD-10-CM | POA: Diagnosis not present

## 2017-02-26 DIAGNOSIS — H2512 Age-related nuclear cataract, left eye: Secondary | ICD-10-CM | POA: Diagnosis not present

## 2017-02-26 DIAGNOSIS — H25813 Combined forms of age-related cataract, bilateral: Secondary | ICD-10-CM | POA: Diagnosis not present

## 2017-04-07 DIAGNOSIS — Z23 Encounter for immunization: Secondary | ICD-10-CM | POA: Diagnosis not present

## 2017-04-07 DIAGNOSIS — I10 Essential (primary) hypertension: Secondary | ICD-10-CM | POA: Diagnosis not present

## 2017-04-07 DIAGNOSIS — Z79899 Other long term (current) drug therapy: Secondary | ICD-10-CM | POA: Diagnosis not present

## 2017-04-07 DIAGNOSIS — F411 Generalized anxiety disorder: Secondary | ICD-10-CM | POA: Diagnosis not present

## 2017-06-11 DIAGNOSIS — M1712 Unilateral primary osteoarthritis, left knee: Secondary | ICD-10-CM | POA: Diagnosis not present

## 2017-06-11 DIAGNOSIS — I10 Essential (primary) hypertension: Secondary | ICD-10-CM | POA: Diagnosis not present

## 2017-06-11 DIAGNOSIS — G8929 Other chronic pain: Secondary | ICD-10-CM | POA: Diagnosis not present

## 2017-08-08 DIAGNOSIS — Z79899 Other long term (current) drug therapy: Secondary | ICD-10-CM | POA: Diagnosis not present

## 2017-08-08 DIAGNOSIS — I872 Venous insufficiency (chronic) (peripheral): Secondary | ICD-10-CM | POA: Diagnosis not present

## 2017-08-08 DIAGNOSIS — I10 Essential (primary) hypertension: Secondary | ICD-10-CM | POA: Diagnosis not present

## 2017-08-26 DIAGNOSIS — I10 Essential (primary) hypertension: Secondary | ICD-10-CM | POA: Diagnosis not present

## 2017-08-26 DIAGNOSIS — F325 Major depressive disorder, single episode, in full remission: Secondary | ICD-10-CM | POA: Diagnosis not present

## 2017-08-26 DIAGNOSIS — M1990 Unspecified osteoarthritis, unspecified site: Secondary | ICD-10-CM | POA: Diagnosis not present

## 2017-08-26 DIAGNOSIS — Z1389 Encounter for screening for other disorder: Secondary | ICD-10-CM | POA: Diagnosis not present

## 2017-08-26 DIAGNOSIS — F411 Generalized anxiety disorder: Secondary | ICD-10-CM | POA: Diagnosis not present

## 2017-08-26 DIAGNOSIS — Z0000 Encounter for general adult medical examination without abnormal findings: Secondary | ICD-10-CM | POA: Diagnosis not present

## 2017-08-26 DIAGNOSIS — Z13820 Encounter for screening for osteoporosis: Secondary | ICD-10-CM | POA: Diagnosis not present

## 2017-08-28 DIAGNOSIS — M25562 Pain in left knee: Secondary | ICD-10-CM | POA: Diagnosis not present

## 2017-08-28 DIAGNOSIS — M25561 Pain in right knee: Secondary | ICD-10-CM | POA: Diagnosis not present

## 2017-09-02 DIAGNOSIS — M25561 Pain in right knee: Secondary | ICD-10-CM | POA: Diagnosis not present

## 2017-09-02 DIAGNOSIS — R262 Difficulty in walking, not elsewhere classified: Secondary | ICD-10-CM | POA: Diagnosis not present

## 2017-09-02 DIAGNOSIS — M25562 Pain in left knee: Secondary | ICD-10-CM | POA: Diagnosis not present

## 2017-09-04 DIAGNOSIS — M25561 Pain in right knee: Secondary | ICD-10-CM | POA: Diagnosis not present

## 2017-09-04 DIAGNOSIS — M25562 Pain in left knee: Secondary | ICD-10-CM | POA: Diagnosis not present

## 2017-09-04 DIAGNOSIS — R262 Difficulty in walking, not elsewhere classified: Secondary | ICD-10-CM | POA: Diagnosis not present

## 2017-09-08 DIAGNOSIS — M25561 Pain in right knee: Secondary | ICD-10-CM | POA: Diagnosis not present

## 2017-09-08 DIAGNOSIS — R262 Difficulty in walking, not elsewhere classified: Secondary | ICD-10-CM | POA: Diagnosis not present

## 2017-09-08 DIAGNOSIS — M25562 Pain in left knee: Secondary | ICD-10-CM | POA: Diagnosis not present

## 2017-09-10 DIAGNOSIS — M25562 Pain in left knee: Secondary | ICD-10-CM | POA: Diagnosis not present

## 2017-09-10 DIAGNOSIS — R262 Difficulty in walking, not elsewhere classified: Secondary | ICD-10-CM | POA: Diagnosis not present

## 2017-09-10 DIAGNOSIS — M25561 Pain in right knee: Secondary | ICD-10-CM | POA: Diagnosis not present

## 2017-09-12 DIAGNOSIS — M25562 Pain in left knee: Secondary | ICD-10-CM | POA: Diagnosis not present

## 2017-09-12 DIAGNOSIS — R262 Difficulty in walking, not elsewhere classified: Secondary | ICD-10-CM | POA: Diagnosis not present

## 2017-09-12 DIAGNOSIS — M25561 Pain in right knee: Secondary | ICD-10-CM | POA: Diagnosis not present

## 2017-09-16 DIAGNOSIS — H25813 Combined forms of age-related cataract, bilateral: Secondary | ICD-10-CM | POA: Diagnosis not present

## 2017-09-16 DIAGNOSIS — H5703 Miosis: Secondary | ICD-10-CM | POA: Diagnosis not present

## 2017-12-23 DIAGNOSIS — M1712 Unilateral primary osteoarthritis, left knee: Secondary | ICD-10-CM | POA: Diagnosis not present

## 2017-12-23 DIAGNOSIS — Z79899 Other long term (current) drug therapy: Secondary | ICD-10-CM | POA: Diagnosis not present

## 2017-12-23 DIAGNOSIS — G894 Chronic pain syndrome: Secondary | ICD-10-CM | POA: Diagnosis not present

## 2017-12-23 DIAGNOSIS — D649 Anemia, unspecified: Secondary | ICD-10-CM | POA: Diagnosis not present

## 2017-12-23 DIAGNOSIS — F325 Major depressive disorder, single episode, in full remission: Secondary | ICD-10-CM | POA: Diagnosis not present

## 2017-12-23 DIAGNOSIS — I10 Essential (primary) hypertension: Secondary | ICD-10-CM | POA: Diagnosis not present

## 2018-01-15 DIAGNOSIS — M1712 Unilateral primary osteoarthritis, left knee: Secondary | ICD-10-CM | POA: Diagnosis not present

## 2018-01-15 DIAGNOSIS — M810 Age-related osteoporosis without current pathological fracture: Secondary | ICD-10-CM | POA: Diagnosis not present

## 2018-01-15 DIAGNOSIS — I10 Essential (primary) hypertension: Secondary | ICD-10-CM | POA: Diagnosis not present

## 2018-01-15 DIAGNOSIS — F325 Major depressive disorder, single episode, in full remission: Secondary | ICD-10-CM | POA: Diagnosis not present

## 2018-01-15 DIAGNOSIS — M1990 Unspecified osteoarthritis, unspecified site: Secondary | ICD-10-CM | POA: Diagnosis not present

## 2018-01-20 DIAGNOSIS — H25813 Combined forms of age-related cataract, bilateral: Secondary | ICD-10-CM | POA: Diagnosis not present

## 2018-02-02 DIAGNOSIS — H2512 Age-related nuclear cataract, left eye: Secondary | ICD-10-CM | POA: Diagnosis not present

## 2018-02-02 DIAGNOSIS — H25812 Combined forms of age-related cataract, left eye: Secondary | ICD-10-CM | POA: Diagnosis not present

## 2018-02-23 DIAGNOSIS — F325 Major depressive disorder, single episode, in full remission: Secondary | ICD-10-CM | POA: Diagnosis not present

## 2018-02-23 DIAGNOSIS — M1712 Unilateral primary osteoarthritis, left knee: Secondary | ICD-10-CM | POA: Diagnosis not present

## 2018-02-23 DIAGNOSIS — M810 Age-related osteoporosis without current pathological fracture: Secondary | ICD-10-CM | POA: Diagnosis not present

## 2018-02-23 DIAGNOSIS — I10 Essential (primary) hypertension: Secondary | ICD-10-CM | POA: Diagnosis not present

## 2018-02-23 DIAGNOSIS — M1990 Unspecified osteoarthritis, unspecified site: Secondary | ICD-10-CM | POA: Diagnosis not present

## 2018-04-01 DIAGNOSIS — M1990 Unspecified osteoarthritis, unspecified site: Secondary | ICD-10-CM | POA: Diagnosis not present

## 2018-04-01 DIAGNOSIS — I10 Essential (primary) hypertension: Secondary | ICD-10-CM | POA: Diagnosis not present

## 2018-04-01 DIAGNOSIS — F325 Major depressive disorder, single episode, in full remission: Secondary | ICD-10-CM | POA: Diagnosis not present

## 2018-04-01 DIAGNOSIS — M1712 Unilateral primary osteoarthritis, left knee: Secondary | ICD-10-CM | POA: Diagnosis not present

## 2018-04-01 DIAGNOSIS — M810 Age-related osteoporosis without current pathological fracture: Secondary | ICD-10-CM | POA: Diagnosis not present

## 2018-04-21 DIAGNOSIS — Z23 Encounter for immunization: Secondary | ICD-10-CM | POA: Diagnosis not present

## 2018-04-21 DIAGNOSIS — I10 Essential (primary) hypertension: Secondary | ICD-10-CM | POA: Diagnosis not present

## 2018-04-21 DIAGNOSIS — G8929 Other chronic pain: Secondary | ICD-10-CM | POA: Diagnosis not present

## 2018-04-21 DIAGNOSIS — M25561 Pain in right knee: Secondary | ICD-10-CM | POA: Diagnosis not present

## 2018-04-30 DIAGNOSIS — M25562 Pain in left knee: Secondary | ICD-10-CM | POA: Diagnosis not present

## 2018-04-30 DIAGNOSIS — M25561 Pain in right knee: Secondary | ICD-10-CM | POA: Diagnosis not present

## 2018-05-19 DIAGNOSIS — I10 Essential (primary) hypertension: Secondary | ICD-10-CM | POA: Diagnosis not present

## 2018-05-19 DIAGNOSIS — M1990 Unspecified osteoarthritis, unspecified site: Secondary | ICD-10-CM | POA: Diagnosis not present

## 2018-05-19 DIAGNOSIS — F325 Major depressive disorder, single episode, in full remission: Secondary | ICD-10-CM | POA: Diagnosis not present

## 2018-05-19 DIAGNOSIS — M810 Age-related osteoporosis without current pathological fracture: Secondary | ICD-10-CM | POA: Diagnosis not present

## 2018-05-19 DIAGNOSIS — M1712 Unilateral primary osteoarthritis, left knee: Secondary | ICD-10-CM | POA: Diagnosis not present

## 2018-06-16 DIAGNOSIS — F325 Major depressive disorder, single episode, in full remission: Secondary | ICD-10-CM | POA: Diagnosis not present

## 2018-06-16 DIAGNOSIS — M1712 Unilateral primary osteoarthritis, left knee: Secondary | ICD-10-CM | POA: Diagnosis not present

## 2018-06-16 DIAGNOSIS — I10 Essential (primary) hypertension: Secondary | ICD-10-CM | POA: Diagnosis not present

## 2018-06-16 DIAGNOSIS — M810 Age-related osteoporosis without current pathological fracture: Secondary | ICD-10-CM | POA: Diagnosis not present

## 2018-06-16 DIAGNOSIS — M1990 Unspecified osteoarthritis, unspecified site: Secondary | ICD-10-CM | POA: Diagnosis not present

## 2018-07-21 DIAGNOSIS — M1990 Unspecified osteoarthritis, unspecified site: Secondary | ICD-10-CM | POA: Diagnosis not present

## 2018-07-21 DIAGNOSIS — I10 Essential (primary) hypertension: Secondary | ICD-10-CM | POA: Diagnosis not present

## 2018-07-21 DIAGNOSIS — M810 Age-related osteoporosis without current pathological fracture: Secondary | ICD-10-CM | POA: Diagnosis not present

## 2018-07-21 DIAGNOSIS — F325 Major depressive disorder, single episode, in full remission: Secondary | ICD-10-CM | POA: Diagnosis not present

## 2018-07-21 DIAGNOSIS — M1712 Unilateral primary osteoarthritis, left knee: Secondary | ICD-10-CM | POA: Diagnosis not present

## 2018-09-14 DIAGNOSIS — K219 Gastro-esophageal reflux disease without esophagitis: Secondary | ICD-10-CM | POA: Diagnosis not present

## 2018-09-14 DIAGNOSIS — L299 Pruritus, unspecified: Secondary | ICD-10-CM | POA: Diagnosis not present

## 2018-09-14 DIAGNOSIS — F325 Major depressive disorder, single episode, in full remission: Secondary | ICD-10-CM | POA: Diagnosis not present

## 2018-09-14 DIAGNOSIS — F411 Generalized anxiety disorder: Secondary | ICD-10-CM | POA: Diagnosis not present

## 2018-09-14 DIAGNOSIS — E7800 Pure hypercholesterolemia, unspecified: Secondary | ICD-10-CM | POA: Diagnosis not present

## 2018-09-14 DIAGNOSIS — Z0000 Encounter for general adult medical examination without abnormal findings: Secondary | ICD-10-CM | POA: Diagnosis not present

## 2018-09-14 DIAGNOSIS — I351 Nonrheumatic aortic (valve) insufficiency: Secondary | ICD-10-CM | POA: Diagnosis not present

## 2018-09-14 DIAGNOSIS — I10 Essential (primary) hypertension: Secondary | ICD-10-CM | POA: Diagnosis not present

## 2018-09-16 ENCOUNTER — Other Ambulatory Visit: Payer: Self-pay | Admitting: *Deleted

## 2018-09-23 DIAGNOSIS — L259 Unspecified contact dermatitis, unspecified cause: Secondary | ICD-10-CM | POA: Diagnosis not present

## 2018-12-08 DIAGNOSIS — F325 Major depressive disorder, single episode, in full remission: Secondary | ICD-10-CM | POA: Diagnosis not present

## 2018-12-08 DIAGNOSIS — M1990 Unspecified osteoarthritis, unspecified site: Secondary | ICD-10-CM | POA: Diagnosis not present

## 2018-12-08 DIAGNOSIS — M810 Age-related osteoporosis without current pathological fracture: Secondary | ICD-10-CM | POA: Diagnosis not present

## 2018-12-08 DIAGNOSIS — M1712 Unilateral primary osteoarthritis, left knee: Secondary | ICD-10-CM | POA: Diagnosis not present

## 2018-12-08 DIAGNOSIS — I10 Essential (primary) hypertension: Secondary | ICD-10-CM | POA: Diagnosis not present

## 2019-01-13 DIAGNOSIS — M810 Age-related osteoporosis without current pathological fracture: Secondary | ICD-10-CM | POA: Diagnosis not present

## 2019-01-13 DIAGNOSIS — M1712 Unilateral primary osteoarthritis, left knee: Secondary | ICD-10-CM | POA: Diagnosis not present

## 2019-01-13 DIAGNOSIS — N182 Chronic kidney disease, stage 2 (mild): Secondary | ICD-10-CM | POA: Diagnosis not present

## 2019-01-13 DIAGNOSIS — I10 Essential (primary) hypertension: Secondary | ICD-10-CM | POA: Diagnosis not present

## 2019-01-13 DIAGNOSIS — M1990 Unspecified osteoarthritis, unspecified site: Secondary | ICD-10-CM | POA: Diagnosis not present

## 2019-01-13 DIAGNOSIS — F325 Major depressive disorder, single episode, in full remission: Secondary | ICD-10-CM | POA: Diagnosis not present

## 2019-01-21 DIAGNOSIS — G309 Alzheimer's disease, unspecified: Secondary | ICD-10-CM | POA: Diagnosis not present

## 2019-01-21 DIAGNOSIS — E46 Unspecified protein-calorie malnutrition: Secondary | ICD-10-CM | POA: Diagnosis not present

## 2019-01-21 DIAGNOSIS — F322 Major depressive disorder, single episode, severe without psychotic features: Secondary | ICD-10-CM | POA: Diagnosis not present

## 2019-02-12 DIAGNOSIS — N182 Chronic kidney disease, stage 2 (mild): Secondary | ICD-10-CM | POA: Diagnosis not present

## 2019-02-12 DIAGNOSIS — F325 Major depressive disorder, single episode, in full remission: Secondary | ICD-10-CM | POA: Diagnosis not present

## 2019-02-12 DIAGNOSIS — M810 Age-related osteoporosis without current pathological fracture: Secondary | ICD-10-CM | POA: Diagnosis not present

## 2019-02-12 DIAGNOSIS — M1712 Unilateral primary osteoarthritis, left knee: Secondary | ICD-10-CM | POA: Diagnosis not present

## 2019-02-12 DIAGNOSIS — I10 Essential (primary) hypertension: Secondary | ICD-10-CM | POA: Diagnosis not present

## 2019-02-12 DIAGNOSIS — M1990 Unspecified osteoarthritis, unspecified site: Secondary | ICD-10-CM | POA: Diagnosis not present

## 2019-02-24 DIAGNOSIS — M810 Age-related osteoporosis without current pathological fracture: Secondary | ICD-10-CM | POA: Diagnosis not present

## 2019-02-24 DIAGNOSIS — M1990 Unspecified osteoarthritis, unspecified site: Secondary | ICD-10-CM | POA: Diagnosis not present

## 2019-02-24 DIAGNOSIS — N182 Chronic kidney disease, stage 2 (mild): Secondary | ICD-10-CM | POA: Diagnosis not present

## 2019-02-24 DIAGNOSIS — F325 Major depressive disorder, single episode, in full remission: Secondary | ICD-10-CM | POA: Diagnosis not present

## 2019-02-24 DIAGNOSIS — I10 Essential (primary) hypertension: Secondary | ICD-10-CM | POA: Diagnosis not present

## 2019-02-24 DIAGNOSIS — M1712 Unilateral primary osteoarthritis, left knee: Secondary | ICD-10-CM | POA: Diagnosis not present

## 2019-02-24 DIAGNOSIS — E7800 Pure hypercholesterolemia, unspecified: Secondary | ICD-10-CM | POA: Diagnosis not present

## 2019-03-26 DIAGNOSIS — F325 Major depressive disorder, single episode, in full remission: Secondary | ICD-10-CM | POA: Diagnosis not present

## 2019-03-26 DIAGNOSIS — N182 Chronic kidney disease, stage 2 (mild): Secondary | ICD-10-CM | POA: Diagnosis not present

## 2019-03-26 DIAGNOSIS — I10 Essential (primary) hypertension: Secondary | ICD-10-CM | POA: Diagnosis not present

## 2019-03-26 DIAGNOSIS — M1712 Unilateral primary osteoarthritis, left knee: Secondary | ICD-10-CM | POA: Diagnosis not present

## 2019-03-26 DIAGNOSIS — E7800 Pure hypercholesterolemia, unspecified: Secondary | ICD-10-CM | POA: Diagnosis not present

## 2019-03-26 DIAGNOSIS — M1990 Unspecified osteoarthritis, unspecified site: Secondary | ICD-10-CM | POA: Diagnosis not present

## 2019-03-26 DIAGNOSIS — M810 Age-related osteoporosis without current pathological fracture: Secondary | ICD-10-CM | POA: Diagnosis not present

## 2019-05-17 DIAGNOSIS — M1712 Unilateral primary osteoarthritis, left knee: Secondary | ICD-10-CM | POA: Diagnosis not present

## 2019-05-17 DIAGNOSIS — E7800 Pure hypercholesterolemia, unspecified: Secondary | ICD-10-CM | POA: Diagnosis not present

## 2019-05-17 DIAGNOSIS — N182 Chronic kidney disease, stage 2 (mild): Secondary | ICD-10-CM | POA: Diagnosis not present

## 2019-05-17 DIAGNOSIS — M810 Age-related osteoporosis without current pathological fracture: Secondary | ICD-10-CM | POA: Diagnosis not present

## 2019-05-17 DIAGNOSIS — F325 Major depressive disorder, single episode, in full remission: Secondary | ICD-10-CM | POA: Diagnosis not present

## 2019-05-17 DIAGNOSIS — M1990 Unspecified osteoarthritis, unspecified site: Secondary | ICD-10-CM | POA: Diagnosis not present

## 2019-05-17 DIAGNOSIS — I10 Essential (primary) hypertension: Secondary | ICD-10-CM | POA: Diagnosis not present

## 2019-06-15 DIAGNOSIS — M1712 Unilateral primary osteoarthritis, left knee: Secondary | ICD-10-CM | POA: Diagnosis not present

## 2019-06-15 DIAGNOSIS — M1990 Unspecified osteoarthritis, unspecified site: Secondary | ICD-10-CM | POA: Diagnosis not present

## 2019-06-15 DIAGNOSIS — I872 Venous insufficiency (chronic) (peripheral): Secondary | ICD-10-CM | POA: Diagnosis not present

## 2019-06-15 DIAGNOSIS — Z79899 Other long term (current) drug therapy: Secondary | ICD-10-CM | POA: Diagnosis not present

## 2019-06-15 DIAGNOSIS — I351 Nonrheumatic aortic (valve) insufficiency: Secondary | ICD-10-CM | POA: Diagnosis not present

## 2019-06-15 DIAGNOSIS — I10 Essential (primary) hypertension: Secondary | ICD-10-CM | POA: Diagnosis not present

## 2019-06-18 DIAGNOSIS — E7800 Pure hypercholesterolemia, unspecified: Secondary | ICD-10-CM | POA: Diagnosis not present

## 2019-06-18 DIAGNOSIS — I10 Essential (primary) hypertension: Secondary | ICD-10-CM | POA: Diagnosis not present

## 2019-06-18 DIAGNOSIS — M810 Age-related osteoporosis without current pathological fracture: Secondary | ICD-10-CM | POA: Diagnosis not present

## 2019-06-18 DIAGNOSIS — N182 Chronic kidney disease, stage 2 (mild): Secondary | ICD-10-CM | POA: Diagnosis not present

## 2019-06-18 DIAGNOSIS — M1990 Unspecified osteoarthritis, unspecified site: Secondary | ICD-10-CM | POA: Diagnosis not present

## 2019-06-18 DIAGNOSIS — F325 Major depressive disorder, single episode, in full remission: Secondary | ICD-10-CM | POA: Diagnosis not present

## 2019-06-18 DIAGNOSIS — M1712 Unilateral primary osteoarthritis, left knee: Secondary | ICD-10-CM | POA: Diagnosis not present

## 2019-06-28 DIAGNOSIS — F325 Major depressive disorder, single episode, in full remission: Secondary | ICD-10-CM | POA: Diagnosis not present

## 2019-06-28 DIAGNOSIS — M1990 Unspecified osteoarthritis, unspecified site: Secondary | ICD-10-CM | POA: Diagnosis not present

## 2019-06-28 DIAGNOSIS — I10 Essential (primary) hypertension: Secondary | ICD-10-CM | POA: Diagnosis not present

## 2019-06-28 DIAGNOSIS — M1712 Unilateral primary osteoarthritis, left knee: Secondary | ICD-10-CM | POA: Diagnosis not present

## 2019-06-28 DIAGNOSIS — M810 Age-related osteoporosis without current pathological fracture: Secondary | ICD-10-CM | POA: Diagnosis not present

## 2019-06-28 DIAGNOSIS — E7800 Pure hypercholesterolemia, unspecified: Secondary | ICD-10-CM | POA: Diagnosis not present

## 2019-06-28 DIAGNOSIS — N182 Chronic kidney disease, stage 2 (mild): Secondary | ICD-10-CM | POA: Diagnosis not present

## 2019-06-30 DIAGNOSIS — Z79899 Other long term (current) drug therapy: Secondary | ICD-10-CM | POA: Diagnosis not present

## 2019-06-30 DIAGNOSIS — I10 Essential (primary) hypertension: Secondary | ICD-10-CM | POA: Diagnosis not present

## 2019-06-30 DIAGNOSIS — N179 Acute kidney failure, unspecified: Secondary | ICD-10-CM | POA: Diagnosis not present

## 2019-06-30 DIAGNOSIS — M1990 Unspecified osteoarthritis, unspecified site: Secondary | ICD-10-CM | POA: Diagnosis not present

## 2019-07-14 ENCOUNTER — Other Ambulatory Visit: Payer: Self-pay | Admitting: *Deleted

## 2019-07-14 DIAGNOSIS — N179 Acute kidney failure, unspecified: Secondary | ICD-10-CM | POA: Diagnosis not present

## 2019-07-14 DIAGNOSIS — N200 Calculus of kidney: Secondary | ICD-10-CM | POA: Diagnosis not present

## 2019-07-14 NOTE — Patient Outreach (Signed)
[facility] [facility]) Care Management  07/14/2019  Molly Neal 1931/10/20 [id number]   Referral Date: 2/23 Referral Source: MD office Referral Reason: Community resources, in home assistance Insurance: Next Gen   Outreach attempt #1, successful.  Call placed to member to follow up on referral, identity verified.  This care manger introduced self and stated purpose of call.  [facility] care management services explained.  Social: She lives alone, was independent with ADL's but has not recently been requiring more help due to pain in her knees, decreasing her ease of mobility.  State she had PT a while back but not recently.  Doesn't feel it would be of benefit at this time due to the pain but she does agree that she is in need of assistance.  Inquired about moving to ALF or with family member, she does not want to consider this option but is willing to have someone come in the home.  Conditions: Per chart, she has history of kidney disease, osteoarthritis, and hypertension.   Medications: Reviewed with member, state she does not have any problems taking them, denies need for financial assistance.    Appointments: Has appointment for ultrasound today, grandson will take her.  Recently had appointment with PCP.   Conversation with member was difficult as she veered off topic multiple times and had to be redirected.  No complex needs identified, she is open to ongoing chronic disease management.  Plan: RN CM will place referral to health coach and CSW.  Valente David, [place], MSN [place] [phone]

## 2019-07-15 ENCOUNTER — Other Ambulatory Visit: Payer: Self-pay | Admitting: *Deleted

## 2019-07-23 ENCOUNTER — Other Ambulatory Visit: Payer: Self-pay | Admitting: *Deleted

## 2019-07-23 NOTE — Patient Outreach (Signed)
[place] [facility]) Care Management  07/23/2019  DNAJA SLEPPY 1931-12-07 [id number]   CSW was able to make initial contact with patient today to perform phone assessment, as well as assess and assist with social work needs and services.  CSW introduced self, explained role and types of services provided through [place] Management ([place] Management).  CSW further explained to patient that CSW works with patient's RNCM, also with [place] Management, Valente David.  CSW then explained the reason for the call, indicating that Mrs. Orene Desanctis thought that patient would benefit from social work services and resources to assist with arranging in-home care services.  CSW obtained two HIPAA compliant identifiers from patient, which included patient's name and date of birth.  Patient admitted that she is now having a very difficult time trying to perform activities of daily living, especially light housekeeping duties, requesting assistance with obtaining in-home care services.  CSW was able to confirm that patient has active Adult Medicaid coverage, through the [place]; therefore, making her eligible to apply for CAPS Forensic scientist), also through the [place], as well as [employer] (Publishing rights manager), through the [place].  CSW agreed to complete the applications on patient's behalf, then mail to her for review and signature before submission.  Patient voiced understanding and was agreeable to this plan, appreciative of CSW's call and for all assistance offered thus far.  Patient was very talkative today, obviously just needing to engage in conversation.  Patient admitted that she has not been able to visit with family due to COVID-19, ready to begin attending church service again, as well as meet with congregation members.  CSW agreed to  contact patient again in one week, on Monday, August 02, 2019, around 10:00am, to ensure that she received the packet of completed applications, as well as to answer any questions she may have regarding application submission.  No additional social work needs have been identified at this time.  [name], BSW, MSW, LCSW  Licensed Education officer, environmental Health System  Mailing [place] [address] [place], [place] [address] Physical Address-[address], [place], [place] [address] Toll Free Main # [phone] Fax # [phone] Cell # [phone]  Office # [phone] [name]

## 2019-07-24 ENCOUNTER — Encounter: Payer: Self-pay | Admitting: *Deleted

## 2019-07-26 ENCOUNTER — Ambulatory Visit: Payer: Medicare Other | Admitting: *Deleted

## 2019-07-28 ENCOUNTER — Other Ambulatory Visit: Payer: Self-pay | Admitting: *Deleted

## 2019-07-28 NOTE — Patient Outreach (Signed)
[place] [facility]) Care Management  07/28/2019  MELE OKLAND 19-Jun-1931 [id number]  Unsuccessful outreach attempt made to patient for initial assessment.  Patient answered the phone and explained that she did not have time to talk today but did state that she does want to speak with nurse and asked if nurse could call her back tomorrow.    Plan: RN Health Coach will call patient 07/29/19   [name] RN, BSN [place] [phone] [email or web address]

## 2019-07-29 ENCOUNTER — Encounter: Payer: Self-pay | Admitting: *Deleted

## 2019-07-29 ENCOUNTER — Other Ambulatory Visit: Payer: Self-pay | Admitting: *Deleted

## 2019-07-29 NOTE — Patient Outreach (Signed)
[place]) Care Management  07/29/2019   Molly Neal August 02, 1931 [id number]    Outreach Attempt: Successful telephone outreach call to patient. HIPAA identifiers obtained.  Social: Patient lives alone. She is independent with all ADLs but reports having great difficulty performing IADLs such as cooking meals, housekeeping, and she is unable to travel to go shopping. She relies on a close friend who is like a "godchild" to her name Molly Neal, her grandson Molly Neal who lives in [place], [place], and a close friend from church to do her shopping and transport her to her medical appointments. Patient denies having any recent falls and states that she does ambulate with a cane. She does feel like she would be safer ambulating with a walker and nurse will contact PCP to request an order.  Patient reports that her home environment is safe and that Molly Neal would come to assist her if something major were to happen. She explains this new onset of not having mobility due to severe knee pain to be independent has been stressful but she denies feeling depressed at this time. THN CSW is assisting patient with arranging in-home care services.   Conditions:  Past medical history includes but is not limited to: Hypertension, chronic kidney disease, osteoporosis, hypercholesterolemia, and osteoarthritis. Ms. Molly Neal states she has had hypertension for many years. She does not monitor her B/P at home but has recently bought a B/P cuff and states she is motivated to start to take it daily and record the values. She does comment that her values can be high at the doctors office and she is concern about her CKD. Nurse provided education regarding the importance of monitoring her B/P and did inform her that uncontrolled B/P will effect her kidney disease.   Medications: Patient takes 6 different medications of which she manages herself. She states she does not experience any bad side effects. She does not  have any questions or concerns about her medications and she does not have any difficulty affording her medications.  Appointments: Patient's last appointment with  Dr. Felipa Neal her PCP was on 06/30/19. She states that the pandemic has caused her to fall behind with her medical appointments.  Advanced Directives: Patient states that she does not have Advanced Directives and she did request that the nurse mail her the documents.    Consent: [facility] services reviewed and discussed with patient. Patient verbally agrees to participate in the [facility] program and to disease management outreaches.  Current Medications:  Current Outpatient Medications  Medication Sig Dispense Refill  . amLODipine (NORVASC) 10 MG tablet Take 10 mg by mouth daily.  6  . HYDROcodone-acetaminophen (NORCO/VICODIN) 5-325 MG per tablet Take by mouth every 8 (eight) hours as needed. For 30 days  0  . LORazepam (ATIVAN) 0.5 MG tablet Take 0.5 mg by mouth 2 (two) times daily as needed.  2  . metoprolol succinate (TOPROL-XL) 50 MG 24 hr tablet Take 50 mg by mouth daily.  6  . sertraline (ZOLOFT) 50 MG tablet Take 50 mg by mouth daily.   10  . traMADol (ULTRAM) 50 MG tablet Take 1 tablet (50 mg total) by mouth every 6 (six) hours as needed. 15 tablet 0  . alendronate (FOSAMAX) 70 MG tablet Take 70 mg by mouth once a week. Reported on 09/12/2015    . atorvastatin (LIPITOR) 10 MG tablet Take 10 mg by mouth daily. Reported on 09/12/2015  6  . benazepril (LOTENSIN) 40 MG tablet Take 40 mg by mouth  daily.  11  . esomeprazole (NEXIUM) 40 MG capsule Take one pill daily (Patient not taking: Reported on 09/12/2015) 30 capsule 0  . meloxicam (MOBIC) 15 MG tablet Take 15 mg by mouth daily.  3   No current facility-administered medications for this visit.    Functional Status:  In your present state of health, do you have any difficulty performing the following activities: 07/29/2019 07/24/2019  Hearing? N N  Vision? N N  Difficulty concentrating  or making decisions? N N  Walking or climbing stairs? Y Y  Comment due to pain in knees Pain in knees.  Dressing or bathing? N N  Doing errands, shopping? Y Y  Comment due to pain in knees Pain in knees.  Preparing Food and eating ? Y Y  Comment due to pain in knees Pain in knees.  Using the Toilet? N N  In the past six months, have you accidently leaked urine? Y Y  Comment incontintent at times Occasional urinary incontinence.  Do you have problems with loss of bowel control? N N  Managing your Medications? N N  Managing your Finances? N N  Housekeeping or managing your Housekeeping? Y Y  Comment due to pain in knees Pain in knees.  Some recent data might be hidden    Fall/Depression Screening: Fall Risk  07/29/2019 07/24/2019 09/21/2015  Falls in the past year? 0 0 Yes  Number falls in past yr: 0 0 2 or more  Injury with Fall? 0 0 Yes  Risk Factor Category  - - High Fall Risk  Risk for fall due to : Impaired mobility;Impaired balance/gait Impaired balance/gait;Impaired mobility Impaired balance/gait;History of fall(s);Impaired mobility  Follow up Falls prevention discussed;Education provided;Falls evaluation completed Education provided;Falls prevention discussed Education provided;Falls prevention discussed   PHQ 2/9 Scores 07/24/2019 07/14/2019 09/21/2015 08/29/2015  PHQ - 2 Score 0 0 0 0   THN CM Care Plan Problem One     Most Recent Value  Care Plan Problem One  Knowledge Deficit of hypertension management related to not maintaining  treatment plan and lifestyle changes  Role Documenting the Problem One  Health [place] for Problem One  Active  THN Long Term Goal   Patient will monitor B/P daily and record values within 90 days  THN Long Term Goal Start Date  07/29/19  Interventions for Problem One Long Term Goal  RN discussed the importance of taking B/P daily and recording the data, discussed eating a low sodium diet, encouraged medication adherence, sent a matter of  choices blood pressure control booklet, sent calendar booklet for patient to record daily B/P values.  THN CM Short Term Goal #1   Patient will start to drink nutritional supplements within 30 days  THN CM Short Term Goal #1 Start Date  07/29/19  Interventions for Short Term Goal #1  Nurse discussed the importance of receiving adequate nutrition and concern that patient is not able to prepare meals. RN will discuss with [facility] SW regarding applying for Meals on Wheels, encouraged patient to increase fluid intake and stay hydrated, patient agreed she should drink a supplement and stated she will ask Molly Neal to buy her some.      Plan:  RN Health Coach will sendTHN Welcome packet RN Health Coach will sendHealth Coach letter RN Health Coach will sendBarrier letter and assessment to PCP RN Health Coach will send Advanced Directive [place] will send A Matter of Choices Blood Pressure Control RN Health Coach will  send Calendar Booklet to record B/P values daily [place] will Call PCP office to ask for an order for a walker [place] will send a message to [place] to ask about Meals on [place] will call patient within the month of April and patient agrees to future outreach calls.   [name] RN, BSN [place] [phone] [email or web address]

## 2019-08-02 ENCOUNTER — Other Ambulatory Visit: Payer: Self-pay | Admitting: *Deleted

## 2019-08-02 DIAGNOSIS — F325 Major depressive disorder, single episode, in full remission: Secondary | ICD-10-CM | POA: Diagnosis not present

## 2019-08-02 DIAGNOSIS — I10 Essential (primary) hypertension: Secondary | ICD-10-CM | POA: Diagnosis not present

## 2019-08-02 DIAGNOSIS — M810 Age-related osteoporosis without current pathological fracture: Secondary | ICD-10-CM | POA: Diagnosis not present

## 2019-08-02 DIAGNOSIS — N182 Chronic kidney disease, stage 2 (mild): Secondary | ICD-10-CM | POA: Diagnosis not present

## 2019-08-02 DIAGNOSIS — E7800 Pure hypercholesterolemia, unspecified: Secondary | ICD-10-CM | POA: Diagnosis not present

## 2019-08-02 DIAGNOSIS — M1712 Unilateral primary osteoarthritis, left knee: Secondary | ICD-10-CM | POA: Diagnosis not present

## 2019-08-02 DIAGNOSIS — M1990 Unspecified osteoarthritis, unspecified site: Secondary | ICD-10-CM | POA: Diagnosis not present

## 2019-08-02 NOTE — Patient Outreach (Signed)
[place] [facility]) Care Management  08/02/2019  Molly Neal November 24, 1931 [id number]   CSW was able to make contact with patient today to follow-up regarding social work services and resources, as well as to ensure that patient received the packet of resource information mailed to her home by CSW.  Patient confirmed receipt of the packet, indicating that she is already beginning to complete the applications for CAPS Forensic scientist), through the [place], as well as [employer] Youth worker), through the [place].    CSW explained to patient that CSW has placed patient on the waiting list for [employer], through Senior Resources of [facility], and that she will be contacted by a representative when it is time for them to perform her phone assessment.  No additional social work needs have been identified at present.  CSW agreed to follow-up with patient again in two weeks, on Monday, August 16, 2019, around 10:00am, to further assist with application completion and submission for CAPS and PCS, as well as assess need for continued social work involvement.  [name], BSW, MSW, LCSW  Licensed Education officer, environmental Health System  Mailing [place] [address], [place], [place] [address] Physical Address-[address], [place], [place] [address] Toll Free Main # [phone] Fax # [phone] Cell # [phone]  Office # [phone] [name]

## 2019-08-03 ENCOUNTER — Other Ambulatory Visit: Payer: Self-pay | Admitting: *Deleted

## 2019-08-03 NOTE — Patient Outreach (Signed)
[place] [facility]) Care Management  08/03/2019  ILDIKO TISSOT Jul 26, 1931 [id number]  Successful telephone outreach call to patient for care coordination. HIPAA identifiers obtained. Nurse informed patient that she did contact Dr. Carlyle Lipa office in regards to ordering a walker and inquiring about how much fluid she can drink due to her kidney status. [name], Dr. Carlyle Lipa assistant, LVM stating that they did order the walker and PCP wants the patient to drink as much fluid as she can to stay hydrated and flush her kidneys. Nurse also informed patient that CSW was filling out documents to sign her up for Meals on Wheels and discussed that there is a waiting list for this service. Patient was appreciative for the information and stated that she did not have any other concerns at this time.  Plan: RN Health Coach will call patient within the month of April and patient agrees to future outreach calls.   [name] RN, BSN [place] [phone] [email or web address]

## 2019-08-16 ENCOUNTER — Other Ambulatory Visit: Payer: Self-pay | Admitting: *Deleted

## 2019-08-16 NOTE — Patient Outreach (Signed)
[place] [facility]) Care Management  08/16/2019  Molly Neal 05-31-1931 [id number]   CSW was able to make contact with patient today to follow-up regarding social work services and resources.  Patient admitted that she has started completing her applications for [facility] ([place]), through the [place], as well as CAPS Forensic scientist), through the [place].  CSW had already filled out a large portion of the applications, before mailing them to patient, just so it would not be so overwhelming and cumbersome for her.  Patient indicated that she will have her grandson review the applications for accuracy, prior to mailing them in for processing.  Patient talked a lot about the pain in her knees, optimistic that there is a procedure that can be performed, other than surgery, to eliminate her pain.  Patient explained that she has an appointment with Dr. Earlie Server, Orthopedic Surgeon at [facility], scheduled for tomorrow, Tuesday, August 17, 2019 at 11:00am.  Patient reported that she has already made arrangements with her grandson to have him transport her to and from this appointment.  Patient was encouraged to use her wheelchair for this appointment, to help relieve the pain and pressure in her knees.  Patient voiced understanding and was definitely agreeable to this plan.  CSW reminded patient that CSW has placed patient on the waiting list for [employer], through Senior Resources of [facility], trying to call and check the status of patient's application, just prior to contacting patient, but without success.  CSW left a HIPAA compliant message for a representative in the Mobile Meals Department and is currently awaiting a return call.  CSW agreed to follow-up with patient again next week, on Wednesday, August 25, 2019, around 12:00pm.   CSW will inquire about patient's appointment with Dr. French Ana, as well as check the status of patient's completion of applications for PCS and CAPS.  CSW will also provide patient with an update about Mobile Meals, after learning where her name is on the waiting list.  [name], BSW, MSW, [place]  Licensed Clinical Social Worker  [place]  Mailing [place] [address], [place], [place] [address] Physical Address-[address], [place], [place] [address] Toll Free Main # [phone] Fax # [phone] Cell # [phone]  Office # [phone] [name]

## 2019-08-17 ENCOUNTER — Other Ambulatory Visit: Payer: Self-pay | Admitting: *Deleted

## 2019-08-17 DIAGNOSIS — M25562 Pain in left knee: Secondary | ICD-10-CM | POA: Diagnosis not present

## 2019-08-17 DIAGNOSIS — M25561 Pain in right knee: Secondary | ICD-10-CM | POA: Diagnosis not present

## 2019-08-17 NOTE — Patient Outreach (Addendum)
[place] [facility]) Care Management  08/17/2019  VIONA LEDVINA 11/15/1931 [id number]   Unsuccessful outreach attempt made to patient for telephone assessment.  RN Health Coach left HIPAA compliant voicemail message along with her contact information.  Plan: RN Health Coach will call patient within the month of April.  [name] RN, BSN [place] [phone] [email or web address]

## 2019-08-20 ENCOUNTER — Ambulatory Visit: Payer: Medicare Other | Admitting: *Deleted

## 2019-08-25 ENCOUNTER — Other Ambulatory Visit: Payer: Self-pay | Admitting: *Deleted

## 2019-08-25 DIAGNOSIS — M1990 Unspecified osteoarthritis, unspecified site: Secondary | ICD-10-CM | POA: Diagnosis not present

## 2019-08-25 DIAGNOSIS — E7800 Pure hypercholesterolemia, unspecified: Secondary | ICD-10-CM | POA: Diagnosis not present

## 2019-08-25 DIAGNOSIS — M1712 Unilateral primary osteoarthritis, left knee: Secondary | ICD-10-CM | POA: Diagnosis not present

## 2019-08-25 DIAGNOSIS — I10 Essential (primary) hypertension: Secondary | ICD-10-CM | POA: Diagnosis not present

## 2019-08-25 DIAGNOSIS — N182 Chronic kidney disease, stage 2 (mild): Secondary | ICD-10-CM | POA: Diagnosis not present

## 2019-08-25 DIAGNOSIS — F325 Major depressive disorder, single episode, in full remission: Secondary | ICD-10-CM | POA: Diagnosis not present

## 2019-08-25 DIAGNOSIS — M810 Age-related osteoporosis without current pathological fracture: Secondary | ICD-10-CM | POA: Diagnosis not present

## 2019-08-25 NOTE — Patient Outreach (Signed)
[place] [facility]) Care Management  08/25/2019  TIENNA ALAMI January 09, 1932 [id number]  CSW was able to make brief contact with patient today to follow-up regarding social work services and resources.  Even though CSW contacted patient around 10:30am today, patient reported that she had just woken up, not having eaten breakfast or having taken her morning medications yet.  Patient requested to be able to return CSW's call at her convenience, for which CSW definitely agreed.  Patient admitted that she will need assistance with completion of her applications for in-home care services, aware that CSW is more than willing to assist with completion, as well as mail in the completed applications for processing.  If CSW does not receive a return call from patient by late next week, CSW will make arrangements to contact patient again on Thursday, September 02, 2019, around [place], BSW, MSW, [employer]  Licensed Clinical Social Worker  [place]  Mailing [place] [address] [place], [place] [address] Physical Address-[address], [place], [place] [address] Toll Free Main # [phone] Fax # [phone] Cell # [phone]  Office # [phone] [name]

## 2019-09-02 ENCOUNTER — Other Ambulatory Visit: Payer: Self-pay | Admitting: *Deleted

## 2019-09-02 ENCOUNTER — Ambulatory Visit: Payer: Medicare Other | Admitting: *Deleted

## 2019-09-02 NOTE — Patient Outreach (Signed)
[place] [facility]) Care Management  09/02/2019  Molly Neal [birth year] [id number]   CSW made an attempt to try and contact patient today to follow-up regarding social work services and resources, as well as to assist patient with completion of her applications for in-home care services over the phone; however, patient was unavailable at the time of CSW's call.  CSW left a HIPAA compliant message on voicemail for patient and is currently awaiting a return call.  During CSW's last call attempt with patient, patient admitted to [place] that she will need assistance completing her applications for PCS ([place]), through the [place], as well as CAPS Forensic scientist), through the [place].  CSW voiced understanding and was agreeable to this plan.  CSW will make a second outreach attempt within the next 3-4 business days, if a return call is not received from patient in the meantime.  [name], BSW, MSW, LCSW  Licensed Education officer, environmental Health System  Mailing [place] [address] [place], [place] [address] Physical Address-[address], [place], [place] [address] Toll Free Main # [phone] Fax # [phone] Cell # [phone]  Office # [phone] [name]

## 2019-09-07 ENCOUNTER — Other Ambulatory Visit: Payer: Self-pay | Admitting: *Deleted

## 2019-09-07 NOTE — Patient Outreach (Signed)
[place] [facility]) Care Management  09/07/2019  Molly Neal 13-Jul-1931 [id number]  CSW was able to make contact with patient today to follow-up regarding social work services and resources, as well as to try and assist patient with completion of her applications for in-home care services.  Patient admitted that she has started completing the application for PCS ([place]), through the [place], as well as the application for CAPS Forensic scientist), through the [place], but that she will need some assistance with a few of the questions.  CSW voiced understanding, agreeing to assist patient while CSW had patient on the phone today.  Patient admitted that today would not be good for her, as she was just waking up for the day and still needed to eat breakfast and take her morning medications.  CSW inquired as to when would be a good day and time for CSW to contact patient to assist with completion.  Patient stated that next Wednesday, September 15, 2019, around 11:30am, would be convenient for her, agreeing to have the paperwork ready to complete at the time of CSW's call.  Patient then talked at length about the new roof on her house, her grandson, the pain in her knees, her upcoming physician appointments, etc., as patient thoroughly enjoys talking on the phone, probably due to the lack of calls and visitors received.  Molly Neal, BSW, MSW, LCSW  Licensed Education officer, environmental Health System  Mailing [place] [address], [place], [place] [address] Physical Address-[address], [place], [place] [address] Toll Free Main # [phone] Fax # [phone] Cell # [phone]  Office # [phone] [email or web address]

## 2019-09-10 ENCOUNTER — Other Ambulatory Visit: Payer: Self-pay | Admitting: *Deleted

## 2019-09-10 NOTE — Patient Outreach (Signed)
[place] [facility]) Care Management  09/10/2019  CHAKERA VENTURO Sep 12, 1931 [id number]  Unsuccessful outreach attempt made to patient.  RN Health Coach left HIPAA compliant voicemail message along with her contact information.  Plan: RN Health Coach will call patient within the month of May  [name] RN, [place] [phone] [email or web address]

## 2019-09-14 DIAGNOSIS — M25561 Pain in right knee: Secondary | ICD-10-CM | POA: Diagnosis not present

## 2019-09-14 DIAGNOSIS — Z0000 Encounter for general adult medical examination without abnormal findings: Secondary | ICD-10-CM | POA: Diagnosis not present

## 2019-09-14 DIAGNOSIS — M25511 Pain in right shoulder: Secondary | ICD-10-CM | POA: Diagnosis not present

## 2019-09-14 DIAGNOSIS — M25562 Pain in left knee: Secondary | ICD-10-CM | POA: Diagnosis not present

## 2019-09-14 DIAGNOSIS — Z1389 Encounter for screening for other disorder: Secondary | ICD-10-CM | POA: Diagnosis not present

## 2019-09-14 DIAGNOSIS — I10 Essential (primary) hypertension: Secondary | ICD-10-CM | POA: Diagnosis not present

## 2019-09-14 DIAGNOSIS — G8929 Other chronic pain: Secondary | ICD-10-CM | POA: Diagnosis not present

## 2019-09-14 DIAGNOSIS — Z23 Encounter for immunization: Secondary | ICD-10-CM | POA: Diagnosis not present

## 2019-09-14 DIAGNOSIS — F325 Major depressive disorder, single episode, in full remission: Secondary | ICD-10-CM | POA: Diagnosis not present

## 2019-09-14 DIAGNOSIS — N289 Disorder of kidney and ureter, unspecified: Secondary | ICD-10-CM | POA: Diagnosis not present

## 2019-09-15 ENCOUNTER — Other Ambulatory Visit: Payer: Self-pay | Admitting: *Deleted

## 2019-09-15 NOTE — Patient Outreach (Signed)
[place] [facility]) Care Management  09/15/2019  Molly Neal [birth year] [id number]   [place] made an attempt to try and contact patient today to assist her with completion of her applications for PCS ([name]), through the [place], as well as her application for CAPS Forensic scientist), through the [place]; however, patient was unavailable at the time of CSW's call.  CSW left a HIPAA compliant message on voicemail for patient and is currently awaiting a return call.  CSW will make a second outreach attempt within the next 3-4 business days, if a return call is not received from patient in the meantime.  [name], BSW, MSW, LCSW  Licensed Education officer, environmental Health System  Mailing [name] [address], [place], [place] [address] Physical Address-[address], [place],  [address] Toll Free Main # [phone] Fax # [phone] Cell # [phone]  Office # [phone] [name]

## 2019-09-20 ENCOUNTER — Other Ambulatory Visit: Payer: Self-pay | Admitting: *Deleted

## 2019-09-20 NOTE — Patient Outreach (Signed)
[place] [facility]) Care Management  09/20/2019  JENIKA LOVVORN August 27, 1931 [id number]  Unsuccessful outreach attempt #3 made to patient for telephone assessment. RN Health Coach called both of the patient's numbers listed in the contact information and left HIPAA compliant voicemail messages along with her contact information.  Plan: RN Health Coach will call patient within the month of May and will send an unsuccessful letter to patient.   [name] RN, BSN [place] [phone] [email or web address]

## 2019-09-21 ENCOUNTER — Ambulatory Visit: Payer: Self-pay | Admitting: *Deleted

## 2019-09-23 ENCOUNTER — Other Ambulatory Visit: Payer: Self-pay | Admitting: *Deleted

## 2019-09-23 NOTE — Patient Outreach (Signed)
[place] [facility]) Care Management  09/23/2019  Molly Neal 1931-11-07 [id number]   CSW was able to make contact with patient today to follow-up regarding social work services and resources, as well as to assist patient with completion of applications for CAPS Forensic scientist), as well as [employer] Youth worker).  CSW was originally scheduled to follow-up with patient on Tuesday, Sep 21, 2019, but was unable to do so, due to taking bereavement leave.  While conversing with patient over the phone, CSW agreed to go ahead and complete both applications on patient's behalf, faxing them directly to patient's Primary Care Physician, Dr. Lajean Manes, for review and signature.  CSW then requested that Dr. Carlyle Lipa nurse, or a member of the office staff at [facility] [place], fax the completed and signed applications to the [place], and [employer] for processing, providing contact information and fax numbers for both.  Last, CSW encouraged, whomever will be faxing the applications, to notify CSW once submission is confirmed, so that CSW can make arrangements to follow-up with these agencies to check the status of patient's applications to receive services.  CSW agreed to follow-up with patient as soon as a response is received from a staff member at [facility] [place], to report findings.  Otherwise, CSW will make arrangements to follow-up with patient again in two weeks, on Thursday, Oct 07, 2019, around 9:00am.  Patient voiced understanding and was agreeable to contacting CSW directly if additional social work needs arise in the meantime.    [name], BSW, MSW, LCSW  Licensed Education officer, environmental Health System  Mailing [place] [address]. [address], [place], [place] [address] Physical Address-[address],  [place], [place] [address] Toll Free Main # [phone] Fax # [phone] Cell # [phone]  Office # [phone] [name]

## 2019-09-24 DIAGNOSIS — E7800 Pure hypercholesterolemia, unspecified: Secondary | ICD-10-CM | POA: Diagnosis not present

## 2019-09-24 DIAGNOSIS — I10 Essential (primary) hypertension: Secondary | ICD-10-CM | POA: Diagnosis not present

## 2019-09-24 DIAGNOSIS — F325 Major depressive disorder, single episode, in full remission: Secondary | ICD-10-CM | POA: Diagnosis not present

## 2019-09-24 DIAGNOSIS — M810 Age-related osteoporosis without current pathological fracture: Secondary | ICD-10-CM | POA: Diagnosis not present

## 2019-09-24 DIAGNOSIS — M1712 Unilateral primary osteoarthritis, left knee: Secondary | ICD-10-CM | POA: Diagnosis not present

## 2019-09-24 DIAGNOSIS — M170 Bilateral primary osteoarthritis of knee: Secondary | ICD-10-CM | POA: Diagnosis not present

## 2019-10-07 ENCOUNTER — Other Ambulatory Visit: Payer: Self-pay | Admitting: *Deleted

## 2019-10-07 NOTE — Patient Outreach (Addendum)
[place]) Care Management  10/07/2019  TINIYA BENKE October 26, 1931 [id number]  CSW made an attempt to try and contact patient's Primary Care Physician, Dr. Theda [place] nurse, Marye Round today to follow-up regarding completion and submission of patient's application for [facility]), through [employer]; however, [name] was unavailable at the time of CSW's call.  CSW left a HIPAA compliant message on voicemail for [name] and is currently awaiting a return call.  CSW wants to ensure that Dr. Felipa Eth received the completed PCS application, for review and signature, and that the application was faxed to [employer] for processing.    CSW also made an attempt to try and contact patient today to follow-up regarding social work services and resources, without success.  CSW wanted to inquire as to whether or not patient has received a call from a representative with [employer] regarding her application for [employer] Youth worker), through [employer].  CSW left HIPAA compliant messages on voicemail for patient, on both her land line, as well as her cell phone.  CSW will make a second outreach attempt within the next 3-4 business days, if a return call is not received from patient in the meantime.    [name], BSW, MSW, LCSW  Licensed Education officer, environmental Health System  Mailing [place] [address] [place], [place] [address] Physical Address-[address], [place], [place] [address] Toll Free Main # [phone] Fax # [phone] Cell # [phone]  Office # [phone] [name]    CSW received a return call from [name], indicating that they never received the faxed PCS application for patient.  CSW faxed the completed application again, confirming receipt from [name].  CSW encouraged  [name] to fax the signed PCS application to [employer] for processing, once Dr. Felipa Eth has had an opportunity to review and sign.  CSW also requested a return call once the [facility] application has been submitted, so that CSW can follow-up with a representative from [employer] to periodically check the status of patient's application process.

## 2019-10-12 ENCOUNTER — Other Ambulatory Visit: Payer: Self-pay | Admitting: *Deleted

## 2019-10-12 NOTE — Patient Outreach (Signed)
[facility]) Care Management  [place]  10/12/2019   Molly Neal 1931-12-17 [id number]  Subjective: Successful telephone outreach call to patient. HIPAA identifiers obtained. Patient states that she did receive her walker but it is difficult to use in her home because it is too big. Nurse encouraged patient to use the walker for her safety and to have someone clear obstacles in the way. She reports that it continues to be difficult for her to make meals and maintain her home. Nurse discussed that she is on the waiting list for Meals on Wheels and she would discuss with CSW in regards to trying to get Mom's Meals for patient. Also, informed patient that the CSW will be contacting her soon to assist with completing the applications to get her long-term assistance within her home.  Patient stated that she has not been taking her B/P routinely. She explained that she has had hypertension for years and did not monitor her B/P and now that it is important for her to do so she often does not remember. She added that she realizes it is important to her health and it is her goal to do much better with taking her B/P daily. Nurse provided patient with hypertension education and suggested that she place the B/P cuff and calendar book by her bed as a reminder. Patient reports that it is hard for her to stay physically active because of the pain she experiences. It is her plan to contact her orthopedist and she does receive various pain relief medications from her PCP. Nurse encouraged patient to be as active as possible to maintain her strength and discussed chair exercises. Patient voiced no other concerns at this time and stated she would contact nurse if she had any further needs.   Encounter Medications:  Outpatient Encounter Medications as of 10/12/2019  Medication Sig Note  . alendronate (FOSAMAX) 70 MG tablet Take 70 mg by mouth once a week. Reported on 09/12/2015 07/29/2019:  Stopped taking  . amLODipine (NORVASC) 10 MG tablet Take 10 mg by mouth daily.   Marland Kitchen atorvastatin (LIPITOR) 10 MG tablet Take 10 mg by mouth daily. Reported on 09/12/2015 07/29/2019: Stopped taking  . benazepril (LOTENSIN) 40 MG tablet Take 40 mg by mouth daily. 07/29/2019: States PCP stopped  . esomeprazole (NEXIUM) 40 MG capsule Take one pill daily (Patient not taking: Reported on 09/12/2015) 07/29/2019: Patient stopped taking  . HYDROcodone-acetaminophen (NORCO/VICODIN) 5-325 MG per tablet Take by mouth every 8 (eight) hours as needed. For 30 days   . LORazepam (ATIVAN) 0.5 MG tablet Take 0.5 mg by mouth 2 (two) times daily as needed.   . meloxicam (MOBIC) 15 MG tablet Take 15 mg by mouth daily. 07/29/2019: States PCP stopped due to kidney function  . metoprolol succinate (TOPROL-XL) 50 MG 24 hr tablet Take 50 mg by mouth daily.   . sertraline (ZOLOFT) 50 MG tablet Take 50 mg by mouth daily.    . traMADol (ULTRAM) 50 MG tablet Take 1 tablet (50 mg total) by mouth every 6 (six) hours as needed.    No facility-administered encounter medications on file as of 10/12/2019.    Functional Status:  In your present state of health, do you have any difficulty performing the following activities: 07/29/2019 07/24/2019  Hearing? N N  Vision? N N  Difficulty concentrating or making decisions? N N  Walking or climbing stairs? Y Y  Comment due to pain in knees Pain in knees.  Dressing or  bathing? N N  Doing errands, shopping? Y Y  Comment due to pain in knees Pain in knees.  Preparing Food and eating ? Y Y  Comment due to pain in knees Pain in knees.  Using the Toilet? N N  In the past six months, have you accidently leaked urine? Y Y  Comment incontintent at times Occasional urinary incontinence.  Do you have problems with loss of bowel control? N N  Managing your Medications? N N  Managing your Finances? N N  Housekeeping or managing your Housekeeping? Y Y  Comment due to pain in knees Pain in knees.   Some recent data might be hidden    Fall/Depression Screening: Fall Risk  10/12/2019 07/29/2019 07/24/2019  Falls in the past year? 0 0 0  Number falls in past yr: 0 0 0  Injury with Fall? 0 0 0  Risk Factor Category  - - -  Risk for fall due to : Impaired balance/gait;Impaired mobility;Impaired vision Impaired mobility;Impaired balance/gait Impaired balance/gait;Impaired mobility  Follow up Falls prevention discussed;Education provided;Falls evaluation completed Falls prevention discussed;Education provided;Falls evaluation completed Education provided;Falls prevention discussed   PHQ 2/9 Scores 07/29/2019 07/24/2019 07/14/2019 09/21/2015 08/29/2015  PHQ - 2 Score 0 0 0 0 0   THN CM Care Plan Problem One     Most Recent Value  Care Plan Problem One  Knowledge Deficit of hypertension management related to not maintaining  treatment plan and lifestyle changes  Role Documenting the Problem One  Health [place] for Problem One  Active  THN Long Term Goal   Patient will monitor B/P daily and record values within the next 90 days  THN Long Term Goal Start Date  07/29/19  Interventions for Problem One Long Term Goal  RN discussed the importance of taking B/P daily and recording data, reviewed health complications of hypertension that is not controlled, encouraged medication and low sodium diet adherence, discussed with patient to place her B/P cuff and calendar booklet by her bed for a reminder and easy access  THN CM Short Term Goal #1   Patient will drink 2 nutritional supplements daily within the next 30 days  THN CM Short Term Goal #1 Start Date  10/12/19  [facility] CM Short Term Goal #1 Met Date  10/12/19  Interventions for Short Term Goal #1  Nurse encouraged patient to drink 2 nutritional supplements daily to increase nutritional status, RN will discuss with CSW about trying to get  Mom's Meals for patient while patient is on Meals on Wheels waiting list  THN CM Short Term Goal #2   Patient will  not fall within the next 30 days.  THN CM Short Term Goal #2 Start Date  10/12/19  Interventions for Short Term Goal #2  Nurse strongly enccouraged patient to use walker when she ambulates, discussed fall risks and safety with patient, will send life alert brochure, will send senior checklist for falls brochure      Plan: [place] will contact CSW to inquire about trying to get Mom's Meals for patient while she is on Meals on Wheels waiting list, will send patient life alert and a fall prevention checklist for older adults brochures, and will send an exercise activity booklet. RN Health Coach will call patient within the month of July and patient agrees to future outreach calls.   [name] RN, BSN [place] [phone] [email or web address]

## 2019-10-12 NOTE — Patient Outreach (Signed)
[facility]) Care Management  10/12/2019  Molly Neal 11/17/1931 [id number]   CSW was scheduled to follow-up with patient tomorrow, regarding social work services and resources; however, CSW wanted to go ahead and contact patient today to inform her that her completed and signed application for [employer] ([place]) was faxed to [employer] today for processing.  CSW reminded patient that it may be several weeks before she receives a call from a representative with [employer] to perform the initial phone interview.  After the initial phone interview, a representative with [employer] will perform a home assessment.  Patient was encouraged to try and answer her phone as much as possible within the next several weeks, as to avoid missing this important call.  Patient voiced understanding and was agreeable to this plan.  In addition, CSW explained to patient that CSW has made a referral for her to Mom's Meals, and that a two weeks supply of meals will be delivered to patient's home this week, probably within the next few days.  CSW has approved for patient to receive a month's supply (28 days) of meals, delivered in two week increments.  These meals are tailor made to meet patient's dietary restrictions, including any food allergies that she may have.  CSW was also able to provide Mom's Meals with a list of patient's food preferences.  CSW has already placed patient on the waiting list for [employer], through [employer] [facility]; however, the waiting list is still pretty extensive.  Patient admitted that she rarely ever cooks for herself anymore, eating whatever is available.  Patient was very pleased to learn that she will be receiving a month's supply of meals, courtesy of [place] [employer], as it is difficult for stand for any length of time, due to all the pain that she is  experiencing in her knees.  Patient also voiced a great deal of hope with the prospect of being able to receive personal care services in the home.  Patient verbalized that it will be much easier for her to bath with assistance, always in fear that she may slip and fall.  Personal care services are also able to assist patient with meal preparation, light housekeeping duties, transportation to and from physician appointments, medication administration, etc.  CSW agreed to follow-up with patient again in two weeks, on Tuesday, October 26, 2019, around 9:00am, to check the status of her PCS application.  [name], BSW, MSW, LCSW  Licensed Education officer, environmental Health System  Mailing [place] [address]. [address], [place], [place] [address] Physical Address-[address], [place], [place] [address] Toll Free Main # [phone] Fax # [phone] Cell # [phone]  Office # [phone] [name]

## 2019-10-13 ENCOUNTER — Ambulatory Visit: Payer: Self-pay | Admitting: *Deleted

## 2019-10-14 ENCOUNTER — Ambulatory Visit: Payer: Self-pay | Admitting: *Deleted

## 2019-10-26 ENCOUNTER — Other Ambulatory Visit: Payer: Self-pay | Admitting: *Deleted

## 2019-10-26 NOTE — Patient Outreach (Signed)
[place] [facility]) Care Management  10/26/2019  DONNETTA GILLIN 03-Sep-1931 [id number]   CSW was able to make contact with patient today to follow-up regarding social work services and resources, as well as to inquire about whether or not patient has received a phone call from a representative with [employer] regarding her recently submitted application for [employer] Youth worker), through the [place].  Patient reported that she has not received a call, but admitted that she does not answer her phone if she does not recognize the phone number.  CSW agreed to contact [employer], on [employer], to check the status of her application.  CSW was able to speak with Tobin Chad, Representative with [employer], who confirmed that patient's application for PCS was received on Oct 12, 2019.  However, Tobin Chad indicated that the [place] recently updated their application for [employer], and that CSW would need to resubmit patient's application on the new form in order for them to be able to process the application.  Tobin Chad agreed to fax CSW the new PCS application, which CSW will complete today and re-fax to patient's Primary Care Physician, Dr. Lajean Manes for review and signature, encouraging Dr. Carlyle Lipa nurse to fax back to CSW once completed.    CSW also agreed to follow-up with patient as soon as the new PCS application has been submitted to [employer] for processing, or within the next two weeks, whichever comes first.  CSW encouraging patient to please answer her phone for all calls received, especially during that three week time-period, after her application has been resubmitted.  Patient voiced understanding and was agreeable to this plan.  Patient reported that she is really enjoying the meals that she is receiving  through [employer], courtesy of [facility].  CSW reminded patient that she is eligible to receive additional meals, if needed, once she has finished her 28-day supply.  [name], BSW, MSW, LCSW  Licensed Education officer, environmental Health System  Mailing [place] [address], [place], [place] [address] Physical Address-[address], [place], [place] [address] Toll Free Main # [phone] Fax # [phone] Cell # [phone]  Office # [phone] [name]

## 2019-11-09 ENCOUNTER — Other Ambulatory Visit: Payer: Self-pay | Admitting: *Deleted

## 2019-11-09 NOTE — Patient Outreach (Signed)
[facility]) Care Management  11/09/2019  Molly Neal 1932/05/09 [id number]   CSW was able to make contact with patient today to follow-up regarding social work services and resources, as well as to inform patient that the new PCS ([place]) application has been completed and faxed to her Primary Care Physician, Dr. Lajean Manes for review and signature.  CSW explained to patient that once CSW receives the completed and signed PCS application back from Dr. Felipa Eth, [place] will submit it to [employer] for processing.  CSW reminded patient to please try and answer her phone during this 3-week time period, as to avoid missing a call from a representative with [employer] to perform their initial phone assessment.  CSW also put patient's grandson, Mort Sawyers as a point of contact, in addition to providing them with CSW's contact information.    CSW is aware that patient has already finished her 28-day supply of prepared meals, through [employer], courtesy of [place] [employer], encouraging patient to notify CSW if she is interested in receiving additional meals.  Patient lives alone, has limited support and continues to suffer from chronic pain in her knees, making it difficult for her to shop for groceries or stand long enough to prepare her own meals.  CSW agreed to follow-up with patient again in two weeks, on Wednesday, November 24, 2019, around 10:00am, unless CSW is able to fax patient's completed and signed PCS application prior to that date, wanting to notify patient of application submission.  CSW was able to confirm that patient has the correct contact information for CSW, encouraging patient to contact CSW directly if additional social work needs arise in the meantime.  [name], BSW, MSW, LCSW  Licensed Education officer, environmental Health System   Mailing [place] [address] [place], [place] [address] Physical Address-[address], [place],  [address] Toll Free Main # [phone] Fax # [phone] Cell # [phone]  Office # [phone] [name]

## 2019-11-24 ENCOUNTER — Other Ambulatory Visit: Payer: Self-pay | Admitting: *Deleted

## 2019-11-24 NOTE — Patient Outreach (Signed)
[facility]) Care Management  11/24/2019  Molly Neal 11-11-1931 [id number]   CSW was able to make contact with Molly Neal, Nurse for Dr. Lajean Manes, patient's Primary Care Physician, today to confirm that patient's new PCS (Molly Neal) application has been faxed to [employer] for processing.  Molly Neal confirmed that the new application was reviewed and signed by Dr. Felipa Eth on Tuesday, November 16, 2019, and faxed by her to [employer], that same day.  CSW offered appreciation to Molly Neal for her assistance with application completion and submission, agreeing to follow-up with her to report findings of patient's approval/denial.  CSW was then able to make contact with a representative from [employer] to confirm that patient's application was received and is currently being processed.  According to the representative that Molly Neal spoke with this morning from [employer], patient's application has been processed and patient successfully performed her telephone assessment this morning (Wednesday, November 24, 2019) at 8:30am.  Patient is aware that she will be receiving a return call from [employer], within the next 72 hours, to inform patient of her approval/denial for services.  CSW contacted patient to reiterate all of the above information, encouraging patient to continue to try and answer her phone, to ensure that she does not miss an important call from [employer].  Patient voiced understanding, indicating that the phone assessment went well, and that she is hopeful that she will be approved for PCS.  Patient reported that she is also awaiting a return call from one of the [employer], to inform her of how many hours of PCS she has been approved for each month.  Patient was pleased that she got to choose her PCS agency of choice.  CSW  agreed to follow-up with patient again next week, on Wednesday, December 01, 2019, around 9:00am, to ensure that she has received a return call from [employer] regarding approval/denial of services.  CSW will also inquire about the number of PCS hours that patient is eligible to receive per month, if patient is approved for services.  Patient was agreeable to this plan, most appreciative of CSW's assistance with completion of applications and arrangement of meal delivery services through [employer], at the expense of [place] [employer].  Molly Neal, BSW, MSW, LCSW  Licensed Education officer, environmental Health System  Mailing [place] [address] [place], [place] [address] Physical Address-[address], [place], [place] [address] Toll Free Main # [phone] Fax # [phone] Cell # [phone]  Office # [phone] [name]

## 2019-11-30 ENCOUNTER — Other Ambulatory Visit: Payer: Self-pay | Admitting: *Deleted

## 2019-11-30 NOTE — Patient Outreach (Signed)
[place] [facility]) Care Management  [facility] Social Work  11/30/2019  Molly Neal 1931-07-05 [id number]  Subjective:  Successful telephone outreach call to patient. HIPAA identifiers obtained. Patient states that she is doing fairly well. Patient reports that she recently has gotten over some sort of stomach virus and is now starting to be able to eat a little more. Nurse suggested that patient drink 1-2 Boost daily to increase nutrition and patient verbalized understanding. Patient explained that she has not been taking her B/P lately but plans to start to take her B/P daily and record the values. She explained that she could not find her booklet she was recording her B/P in and per her memory her systolic B/P was around 185. Patient reports that [employer] did contact her and she should hear if she qualifies to receive a home health aide in a few weeks. Nurse confirmed that patient had her contact number to call if needed.    Encounter Medications:  Outpatient Encounter Medications as of 11/30/2019  Medication Sig Note  . alendronate (FOSAMAX) 70 MG tablet Take 70 mg by mouth once a week. Reported on 09/12/2015 07/29/2019: Stopped taking  . amLODipine (NORVASC) 10 MG tablet Take 10 mg by mouth daily.   Marland Kitchen atorvastatin (LIPITOR) 10 MG tablet Take 10 mg by mouth daily. Reported on 09/12/2015 07/29/2019: Stopped taking  . benazepril (LOTENSIN) 40 MG tablet Take 40 mg by mouth daily. 07/29/2019: States PCP stopped  . esomeprazole (NEXIUM) 40 MG capsule Take one pill daily (Patient not taking: Reported on 09/12/2015) 07/29/2019: Patient stopped taking  . HYDROcodone-acetaminophen (NORCO/VICODIN) 5-325 MG per tablet Take by mouth every 8 (eight) hours as needed. For 30 days   . LORazepam (ATIVAN) 0.5 MG tablet Take 0.5 mg by mouth 2 (two) times daily as needed.   . meloxicam (MOBIC) 15 MG tablet Take 15 mg by mouth daily. 07/29/2019: States PCP stopped due to kidney function  .  metoprolol succinate (TOPROL-XL) 50 MG 24 hr tablet Take 50 mg by mouth daily.   . sertraline (ZOLOFT) 50 MG tablet Take 50 mg by mouth daily.    . traMADol (ULTRAM) 50 MG tablet Take 1 tablet (50 mg total) by mouth every 6 (six) hours as needed.    No facility-administered encounter medications on file as of 11/30/2019.    Functional Status:  In your present state of health, do you have any difficulty performing the following activities: 07/29/2019 07/24/2019  Hearing? N N  Vision? N N  Difficulty concentrating or making decisions? N N  Walking or climbing stairs? Y Y  Comment due to pain in knees Pain in knees.  Dressing or bathing? N N  Doing errands, shopping? Y Y  Comment due to pain in knees Pain in knees.  Preparing Food and eating ? Y Y  Comment due to pain in knees Pain in knees.  Using the Toilet? N N  In the past six months, have you accidently leaked urine? Y Y  Comment incontintent at times Occasional urinary incontinence.  Do you have problems with loss of bowel control? N N  Managing your Medications? N N  Managing your Finances? N N  Housekeeping or managing your Housekeeping? Y Y  Comment due to pain in knees Pain in knees.  Some recent data might be hidden    Fall/Depression Screening:  PHQ 2/9 Scores 07/29/2019 07/24/2019 07/14/2019 09/21/2015 08/29/2015  PHQ - 2 Score 0 0 0 0 0   Goals Addressed  This Visit's Progress   . Patient will verbalize taking her B/P daily and recording the value within the next 90 days.       CARE PLAN ENTRY (see longtitudinal plan of care for additional care plan information)  Objective:  . Last practice recorded BP readings:  BP Readings from Last 3 Encounters:  08/29/15 (!) 172/70  07/20/14 167/67  04/28/14 156/64 .   Marland Kitchen Most recent eGFR/CrCl: No results found for: EGFR  No components found for: CRCL  Current Barriers:  Marland Kitchen Knowledge deficit related to self care management of hypertension  Case Manager Clinical  Goal(s):  Marland Kitchen Over the next 90 days, patient will verbalize understanding of plan for hypertension management . Over the next 90 days, patient will demonstrate improved adherence to prescribed treatment plan for hypertension as evidenced by taking all medications as prescribed, monitoring and recording blood pressure as directed, adhering to low sodium/DASH diet  Interventions:  . Reviewed medications with patient and discussed importance of compliance . Advised patient, providing education and rationale, to monitor blood pressure daily and record, calling PCP for findings outside established parameters.  . Provided education regarding s/s of DASH diet/Low salt diet  Patient Self Care Activities:  . Self administers medications as prescribed . Patient reports that she will start take her B/P daily and record values         Plan: [place] will call patient within the month of August and patient agrees to future outreach calls.   [name] RN, BSN [place] [phone] [email or web address]

## 2019-12-01 ENCOUNTER — Encounter: Payer: Self-pay | Admitting: *Deleted

## 2019-12-01 ENCOUNTER — Other Ambulatory Visit: Payer: Self-pay | Admitting: *Deleted

## 2019-12-01 NOTE — Patient Outreach (Signed)
Molly Neal) Care Management  12/01/2019  Molly Neal [birth year] [id number]   CSW was able to make contact with patient today to confirm that she has been approved for [employer] through [employer], a division of the [employer].  Patient admitted that she has not yet received the "official letter of approval", but was told by the individual that performed the phone assessment that she will be approved.  CSW was also able to make contact with a representative from [employer] today, to confirm patient's approval for [employer].    CSW explained to patient that she will be receiving PCS through [facility] Neal, her home health agency of choice, according to the representative with [employer] that [name] spoke with today.  [facility] is scheduled to begin providing services within the next two weeks.  These services will include:  grocery shopping, meal preparation, light housekeeping duties, assistance with bathing and dressing, laundry, transportation to and from physician appointments, etc.  CSW will perform a case closure on patient, as all goals of treatment have been met from social work standpoint and no additional social work needs have been identified at this time.  CSW will notify patient's [name] with [place] [facility], Molly Neal of CSW's plans to close patient's case.  CSW will fax an update to patient's Primary Care Physician, Dr. Lajean Neal to ensure that he is aware of CSW's involvement with patient's plan of care.  CSW will also route a Physician Case Closure Letter to Dr. Felipa Neal.   Molly Neal, BSW, MSW, LCSW  Licensed Education officer, [facility]  Mailing [place] [address], [place], [place] [address] Physical Address-[address], [place], [place] [address] Toll Free Main # [phone] Fax # [phone] Cell # [phone]  Office # [phone] [email or web address]

## 2019-12-08 DIAGNOSIS — J309 Allergic rhinitis, unspecified: Secondary | ICD-10-CM | POA: Diagnosis not present

## 2019-12-08 DIAGNOSIS — M810 Age-related osteoporosis without current pathological fracture: Secondary | ICD-10-CM | POA: Diagnosis not present

## 2019-12-08 DIAGNOSIS — I080 Rheumatic disorders of both mitral and aortic valves: Secondary | ICD-10-CM | POA: Diagnosis not present

## 2019-12-08 DIAGNOSIS — Z87891 Personal history of nicotine dependence: Secondary | ICD-10-CM | POA: Diagnosis not present

## 2019-12-08 DIAGNOSIS — I872 Venous insufficiency (chronic) (peripheral): Secondary | ICD-10-CM | POA: Diagnosis not present

## 2019-12-08 DIAGNOSIS — M170 Bilateral primary osteoarthritis of knee: Secondary | ICD-10-CM | POA: Diagnosis not present

## 2019-12-08 DIAGNOSIS — D509 Iron deficiency anemia, unspecified: Secondary | ICD-10-CM | POA: Diagnosis not present

## 2019-12-08 DIAGNOSIS — K5790 Diverticulosis of intestine, part unspecified, without perforation or abscess without bleeding: Secondary | ICD-10-CM | POA: Diagnosis not present

## 2019-12-08 DIAGNOSIS — E7800 Pure hypercholesterolemia, unspecified: Secondary | ICD-10-CM | POA: Diagnosis not present

## 2019-12-08 DIAGNOSIS — F419 Anxiety disorder, unspecified: Secondary | ICD-10-CM | POA: Diagnosis not present

## 2019-12-08 DIAGNOSIS — N182 Chronic kidney disease, stage 2 (mild): Secondary | ICD-10-CM | POA: Diagnosis not present

## 2019-12-08 DIAGNOSIS — F325 Major depressive disorder, single episode, in full remission: Secondary | ICD-10-CM | POA: Diagnosis not present

## 2019-12-08 DIAGNOSIS — K219 Gastro-esophageal reflux disease without esophagitis: Secondary | ICD-10-CM | POA: Diagnosis not present

## 2019-12-08 DIAGNOSIS — M25511 Pain in right shoulder: Secondary | ICD-10-CM | POA: Diagnosis not present

## 2019-12-08 DIAGNOSIS — G8929 Other chronic pain: Secondary | ICD-10-CM | POA: Diagnosis not present

## 2019-12-08 DIAGNOSIS — I129 Hypertensive chronic kidney disease with stage 1 through stage 4 chronic kidney disease, or unspecified chronic kidney disease: Secondary | ICD-10-CM | POA: Diagnosis not present

## 2019-12-13 DIAGNOSIS — D509 Iron deficiency anemia, unspecified: Secondary | ICD-10-CM | POA: Diagnosis not present

## 2019-12-13 DIAGNOSIS — F325 Major depressive disorder, single episode, in full remission: Secondary | ICD-10-CM | POA: Diagnosis not present

## 2019-12-13 DIAGNOSIS — N182 Chronic kidney disease, stage 2 (mild): Secondary | ICD-10-CM | POA: Diagnosis not present

## 2019-12-13 DIAGNOSIS — M810 Age-related osteoporosis without current pathological fracture: Secondary | ICD-10-CM | POA: Diagnosis not present

## 2019-12-13 DIAGNOSIS — I10 Essential (primary) hypertension: Secondary | ICD-10-CM | POA: Diagnosis not present

## 2019-12-13 DIAGNOSIS — I872 Venous insufficiency (chronic) (peripheral): Secondary | ICD-10-CM | POA: Diagnosis not present

## 2019-12-13 DIAGNOSIS — M1712 Unilateral primary osteoarthritis, left knee: Secondary | ICD-10-CM | POA: Diagnosis not present

## 2019-12-13 DIAGNOSIS — M170 Bilateral primary osteoarthritis of knee: Secondary | ICD-10-CM | POA: Diagnosis not present

## 2019-12-13 DIAGNOSIS — I129 Hypertensive chronic kidney disease with stage 1 through stage 4 chronic kidney disease, or unspecified chronic kidney disease: Secondary | ICD-10-CM | POA: Diagnosis not present

## 2019-12-13 DIAGNOSIS — E7800 Pure hypercholesterolemia, unspecified: Secondary | ICD-10-CM | POA: Diagnosis not present

## 2019-12-14 DIAGNOSIS — N390 Urinary tract infection, site not specified: Secondary | ICD-10-CM | POA: Diagnosis not present

## 2019-12-14 DIAGNOSIS — Z23 Encounter for immunization: Secondary | ICD-10-CM | POA: Diagnosis not present

## 2019-12-14 DIAGNOSIS — I10 Essential (primary) hypertension: Secondary | ICD-10-CM | POA: Diagnosis not present

## 2019-12-14 DIAGNOSIS — R269 Unspecified abnormalities of gait and mobility: Secondary | ICD-10-CM | POA: Diagnosis not present

## 2019-12-14 DIAGNOSIS — Z79899 Other long term (current) drug therapy: Secondary | ICD-10-CM | POA: Diagnosis not present

## 2019-12-14 DIAGNOSIS — E46 Unspecified protein-calorie malnutrition: Secondary | ICD-10-CM | POA: Diagnosis not present

## 2019-12-15 DIAGNOSIS — N182 Chronic kidney disease, stage 2 (mild): Secondary | ICD-10-CM | POA: Diagnosis not present

## 2019-12-15 DIAGNOSIS — D509 Iron deficiency anemia, unspecified: Secondary | ICD-10-CM | POA: Diagnosis not present

## 2019-12-15 DIAGNOSIS — I872 Venous insufficiency (chronic) (peripheral): Secondary | ICD-10-CM | POA: Diagnosis not present

## 2019-12-15 DIAGNOSIS — I129 Hypertensive chronic kidney disease with stage 1 through stage 4 chronic kidney disease, or unspecified chronic kidney disease: Secondary | ICD-10-CM | POA: Diagnosis not present

## 2019-12-15 DIAGNOSIS — M170 Bilateral primary osteoarthritis of knee: Secondary | ICD-10-CM | POA: Diagnosis not present

## 2019-12-15 DIAGNOSIS — E7800 Pure hypercholesterolemia, unspecified: Secondary | ICD-10-CM | POA: Diagnosis not present

## 2019-12-16 DIAGNOSIS — M25562 Pain in left knee: Secondary | ICD-10-CM | POA: Diagnosis not present

## 2019-12-16 DIAGNOSIS — M25561 Pain in right knee: Secondary | ICD-10-CM | POA: Diagnosis not present

## 2019-12-16 DIAGNOSIS — M25511 Pain in right shoulder: Secondary | ICD-10-CM | POA: Diagnosis not present

## 2019-12-20 DIAGNOSIS — I872 Venous insufficiency (chronic) (peripheral): Secondary | ICD-10-CM | POA: Diagnosis not present

## 2019-12-20 DIAGNOSIS — D509 Iron deficiency anemia, unspecified: Secondary | ICD-10-CM | POA: Diagnosis not present

## 2019-12-20 DIAGNOSIS — E7800 Pure hypercholesterolemia, unspecified: Secondary | ICD-10-CM | POA: Diagnosis not present

## 2019-12-20 DIAGNOSIS — N182 Chronic kidney disease, stage 2 (mild): Secondary | ICD-10-CM | POA: Diagnosis not present

## 2019-12-20 DIAGNOSIS — I129 Hypertensive chronic kidney disease with stage 1 through stage 4 chronic kidney disease, or unspecified chronic kidney disease: Secondary | ICD-10-CM | POA: Diagnosis not present

## 2019-12-20 DIAGNOSIS — M170 Bilateral primary osteoarthritis of knee: Secondary | ICD-10-CM | POA: Diagnosis not present

## 2019-12-21 DIAGNOSIS — I129 Hypertensive chronic kidney disease with stage 1 through stage 4 chronic kidney disease, or unspecified chronic kidney disease: Secondary | ICD-10-CM | POA: Diagnosis not present

## 2019-12-21 DIAGNOSIS — N182 Chronic kidney disease, stage 2 (mild): Secondary | ICD-10-CM | POA: Diagnosis not present

## 2019-12-21 DIAGNOSIS — I872 Venous insufficiency (chronic) (peripheral): Secondary | ICD-10-CM | POA: Diagnosis not present

## 2019-12-21 DIAGNOSIS — M170 Bilateral primary osteoarthritis of knee: Secondary | ICD-10-CM | POA: Diagnosis not present

## 2019-12-21 DIAGNOSIS — E7800 Pure hypercholesterolemia, unspecified: Secondary | ICD-10-CM | POA: Diagnosis not present

## 2019-12-21 DIAGNOSIS — D509 Iron deficiency anemia, unspecified: Secondary | ICD-10-CM | POA: Diagnosis not present

## 2019-12-22 DIAGNOSIS — I129 Hypertensive chronic kidney disease with stage 1 through stage 4 chronic kidney disease, or unspecified chronic kidney disease: Secondary | ICD-10-CM | POA: Diagnosis not present

## 2019-12-22 DIAGNOSIS — N182 Chronic kidney disease, stage 2 (mild): Secondary | ICD-10-CM | POA: Diagnosis not present

## 2019-12-22 DIAGNOSIS — E7800 Pure hypercholesterolemia, unspecified: Secondary | ICD-10-CM | POA: Diagnosis not present

## 2019-12-22 DIAGNOSIS — M170 Bilateral primary osteoarthritis of knee: Secondary | ICD-10-CM | POA: Diagnosis not present

## 2019-12-22 DIAGNOSIS — D509 Iron deficiency anemia, unspecified: Secondary | ICD-10-CM | POA: Diagnosis not present

## 2019-12-22 DIAGNOSIS — I872 Venous insufficiency (chronic) (peripheral): Secondary | ICD-10-CM | POA: Diagnosis not present

## 2019-12-27 DIAGNOSIS — E7800 Pure hypercholesterolemia, unspecified: Secondary | ICD-10-CM | POA: Diagnosis not present

## 2019-12-27 DIAGNOSIS — M170 Bilateral primary osteoarthritis of knee: Secondary | ICD-10-CM | POA: Diagnosis not present

## 2019-12-27 DIAGNOSIS — N182 Chronic kidney disease, stage 2 (mild): Secondary | ICD-10-CM | POA: Diagnosis not present

## 2019-12-27 DIAGNOSIS — I872 Venous insufficiency (chronic) (peripheral): Secondary | ICD-10-CM | POA: Diagnosis not present

## 2019-12-27 DIAGNOSIS — D509 Iron deficiency anemia, unspecified: Secondary | ICD-10-CM | POA: Diagnosis not present

## 2019-12-27 DIAGNOSIS — I129 Hypertensive chronic kidney disease with stage 1 through stage 4 chronic kidney disease, or unspecified chronic kidney disease: Secondary | ICD-10-CM | POA: Diagnosis not present

## 2019-12-28 DIAGNOSIS — M170 Bilateral primary osteoarthritis of knee: Secondary | ICD-10-CM | POA: Diagnosis not present

## 2019-12-28 DIAGNOSIS — I872 Venous insufficiency (chronic) (peripheral): Secondary | ICD-10-CM | POA: Diagnosis not present

## 2019-12-28 DIAGNOSIS — D509 Iron deficiency anemia, unspecified: Secondary | ICD-10-CM | POA: Diagnosis not present

## 2019-12-28 DIAGNOSIS — N182 Chronic kidney disease, stage 2 (mild): Secondary | ICD-10-CM | POA: Diagnosis not present

## 2019-12-28 DIAGNOSIS — I129 Hypertensive chronic kidney disease with stage 1 through stage 4 chronic kidney disease, or unspecified chronic kidney disease: Secondary | ICD-10-CM | POA: Diagnosis not present

## 2019-12-28 DIAGNOSIS — E7800 Pure hypercholesterolemia, unspecified: Secondary | ICD-10-CM | POA: Diagnosis not present

## 2019-12-29 DIAGNOSIS — M170 Bilateral primary osteoarthritis of knee: Secondary | ICD-10-CM | POA: Diagnosis not present

## 2019-12-29 DIAGNOSIS — I129 Hypertensive chronic kidney disease with stage 1 through stage 4 chronic kidney disease, or unspecified chronic kidney disease: Secondary | ICD-10-CM | POA: Diagnosis not present

## 2019-12-29 DIAGNOSIS — N182 Chronic kidney disease, stage 2 (mild): Secondary | ICD-10-CM | POA: Diagnosis not present

## 2019-12-29 DIAGNOSIS — E7800 Pure hypercholesterolemia, unspecified: Secondary | ICD-10-CM | POA: Diagnosis not present

## 2019-12-29 DIAGNOSIS — I872 Venous insufficiency (chronic) (peripheral): Secondary | ICD-10-CM | POA: Diagnosis not present

## 2019-12-29 DIAGNOSIS — D509 Iron deficiency anemia, unspecified: Secondary | ICD-10-CM | POA: Diagnosis not present

## 2020-01-05 DIAGNOSIS — I872 Venous insufficiency (chronic) (peripheral): Secondary | ICD-10-CM | POA: Diagnosis not present

## 2020-01-05 DIAGNOSIS — I129 Hypertensive chronic kidney disease with stage 1 through stage 4 chronic kidney disease, or unspecified chronic kidney disease: Secondary | ICD-10-CM | POA: Diagnosis not present

## 2020-01-05 DIAGNOSIS — E7800 Pure hypercholesterolemia, unspecified: Secondary | ICD-10-CM | POA: Diagnosis not present

## 2020-01-05 DIAGNOSIS — M170 Bilateral primary osteoarthritis of knee: Secondary | ICD-10-CM | POA: Diagnosis not present

## 2020-01-05 DIAGNOSIS — N182 Chronic kidney disease, stage 2 (mild): Secondary | ICD-10-CM | POA: Diagnosis not present

## 2020-01-05 DIAGNOSIS — D509 Iron deficiency anemia, unspecified: Secondary | ICD-10-CM | POA: Diagnosis not present

## 2020-01-06 DIAGNOSIS — E7800 Pure hypercholesterolemia, unspecified: Secondary | ICD-10-CM | POA: Diagnosis not present

## 2020-01-06 DIAGNOSIS — M170 Bilateral primary osteoarthritis of knee: Secondary | ICD-10-CM | POA: Diagnosis not present

## 2020-01-06 DIAGNOSIS — I129 Hypertensive chronic kidney disease with stage 1 through stage 4 chronic kidney disease, or unspecified chronic kidney disease: Secondary | ICD-10-CM | POA: Diagnosis not present

## 2020-01-06 DIAGNOSIS — D509 Iron deficiency anemia, unspecified: Secondary | ICD-10-CM | POA: Diagnosis not present

## 2020-01-06 DIAGNOSIS — I872 Venous insufficiency (chronic) (peripheral): Secondary | ICD-10-CM | POA: Diagnosis not present

## 2020-01-06 DIAGNOSIS — N182 Chronic kidney disease, stage 2 (mild): Secondary | ICD-10-CM | POA: Diagnosis not present

## 2020-01-07 DIAGNOSIS — F419 Anxiety disorder, unspecified: Secondary | ICD-10-CM | POA: Diagnosis not present

## 2020-01-07 DIAGNOSIS — I872 Venous insufficiency (chronic) (peripheral): Secondary | ICD-10-CM | POA: Diagnosis not present

## 2020-01-07 DIAGNOSIS — M170 Bilateral primary osteoarthritis of knee: Secondary | ICD-10-CM | POA: Diagnosis not present

## 2020-01-07 DIAGNOSIS — F325 Major depressive disorder, single episode, in full remission: Secondary | ICD-10-CM | POA: Diagnosis not present

## 2020-01-07 DIAGNOSIS — I10 Essential (primary) hypertension: Secondary | ICD-10-CM | POA: Diagnosis not present

## 2020-01-07 DIAGNOSIS — K219 Gastro-esophageal reflux disease without esophagitis: Secondary | ICD-10-CM | POA: Diagnosis not present

## 2020-01-07 DIAGNOSIS — M810 Age-related osteoporosis without current pathological fracture: Secondary | ICD-10-CM | POA: Diagnosis not present

## 2020-01-07 DIAGNOSIS — I080 Rheumatic disorders of both mitral and aortic valves: Secondary | ICD-10-CM | POA: Diagnosis not present

## 2020-01-07 DIAGNOSIS — M1712 Unilateral primary osteoarthritis, left knee: Secondary | ICD-10-CM | POA: Diagnosis not present

## 2020-01-07 DIAGNOSIS — G8929 Other chronic pain: Secondary | ICD-10-CM | POA: Diagnosis not present

## 2020-01-07 DIAGNOSIS — N182 Chronic kidney disease, stage 2 (mild): Secondary | ICD-10-CM | POA: Diagnosis not present

## 2020-01-07 DIAGNOSIS — Z87891 Personal history of nicotine dependence: Secondary | ICD-10-CM | POA: Diagnosis not present

## 2020-01-07 DIAGNOSIS — M25511 Pain in right shoulder: Secondary | ICD-10-CM | POA: Diagnosis not present

## 2020-01-07 DIAGNOSIS — K5790 Diverticulosis of intestine, part unspecified, without perforation or abscess without bleeding: Secondary | ICD-10-CM | POA: Diagnosis not present

## 2020-01-07 DIAGNOSIS — D509 Iron deficiency anemia, unspecified: Secondary | ICD-10-CM | POA: Diagnosis not present

## 2020-01-07 DIAGNOSIS — E7800 Pure hypercholesterolemia, unspecified: Secondary | ICD-10-CM | POA: Diagnosis not present

## 2020-01-07 DIAGNOSIS — I129 Hypertensive chronic kidney disease with stage 1 through stage 4 chronic kidney disease, or unspecified chronic kidney disease: Secondary | ICD-10-CM | POA: Diagnosis not present

## 2020-01-07 DIAGNOSIS — J309 Allergic rhinitis, unspecified: Secondary | ICD-10-CM | POA: Diagnosis not present

## 2020-01-11 DIAGNOSIS — M170 Bilateral primary osteoarthritis of knee: Secondary | ICD-10-CM | POA: Diagnosis not present

## 2020-01-11 DIAGNOSIS — D509 Iron deficiency anemia, unspecified: Secondary | ICD-10-CM | POA: Diagnosis not present

## 2020-01-11 DIAGNOSIS — N182 Chronic kidney disease, stage 2 (mild): Secondary | ICD-10-CM | POA: Diagnosis not present

## 2020-01-11 DIAGNOSIS — I129 Hypertensive chronic kidney disease with stage 1 through stage 4 chronic kidney disease, or unspecified chronic kidney disease: Secondary | ICD-10-CM | POA: Diagnosis not present

## 2020-01-11 DIAGNOSIS — E7800 Pure hypercholesterolemia, unspecified: Secondary | ICD-10-CM | POA: Diagnosis not present

## 2020-01-11 DIAGNOSIS — I872 Venous insufficiency (chronic) (peripheral): Secondary | ICD-10-CM | POA: Diagnosis not present

## 2020-01-13 ENCOUNTER — Other Ambulatory Visit: Payer: Self-pay | Admitting: *Deleted

## 2020-01-13 DIAGNOSIS — D509 Iron deficiency anemia, unspecified: Secondary | ICD-10-CM | POA: Diagnosis not present

## 2020-01-13 DIAGNOSIS — M170 Bilateral primary osteoarthritis of knee: Secondary | ICD-10-CM | POA: Diagnosis not present

## 2020-01-13 DIAGNOSIS — I872 Venous insufficiency (chronic) (peripheral): Secondary | ICD-10-CM | POA: Diagnosis not present

## 2020-01-13 DIAGNOSIS — E7800 Pure hypercholesterolemia, unspecified: Secondary | ICD-10-CM | POA: Diagnosis not present

## 2020-01-13 DIAGNOSIS — N182 Chronic kidney disease, stage 2 (mild): Secondary | ICD-10-CM | POA: Diagnosis not present

## 2020-01-13 DIAGNOSIS — I129 Hypertensive chronic kidney disease with stage 1 through stage 4 chronic kidney disease, or unspecified chronic kidney disease: Secondary | ICD-10-CM | POA: Diagnosis not present

## 2020-01-13 NOTE — Patient Outreach (Signed)
[place] [facility]) Care Management  01/13/2020  GLADIE GRAVETTE 1931/07/04 [id number]  Outreach attempt to patient. No answer and unable to leave voicemail message due to patient's phone picks up and has a busy signal.   Plan:  [place] will call patient within the month of September and patient agrees to future outreach calls.   [name] RN, BSN [place] [phone] [email or web address]

## 2020-01-17 ENCOUNTER — Other Ambulatory Visit: Payer: Self-pay | Admitting: *Deleted

## 2020-01-17 NOTE — Patient Outreach (Signed)
[place] [facility]) Care Management  01/17/2020  Molly Neal 1932/04/28 [id number]  Outreach attempt to patient. No answer and unable to leave voicemail message due to patients's home phone number answers with a busy signal x2 attempts. Nurse tried to call patient's cell number and the patient picked up, acknowledged that it was her but then the phone went dead. Nurse called patient's cell number a second time and received a VM stating that patient's voice mailbox is full.   Plan: RN Health Coach will call patient within the month of September and will send patient an unsuccessful letter.  [name] RN, BSN [place] [phone] [email or web address]

## 2020-01-18 ENCOUNTER — Ambulatory Visit: Payer: Self-pay | Admitting: *Deleted

## 2020-01-19 DIAGNOSIS — D509 Iron deficiency anemia, unspecified: Secondary | ICD-10-CM | POA: Diagnosis not present

## 2020-01-19 DIAGNOSIS — I872 Venous insufficiency (chronic) (peripheral): Secondary | ICD-10-CM | POA: Diagnosis not present

## 2020-01-19 DIAGNOSIS — E7800 Pure hypercholesterolemia, unspecified: Secondary | ICD-10-CM | POA: Diagnosis not present

## 2020-01-19 DIAGNOSIS — N182 Chronic kidney disease, stage 2 (mild): Secondary | ICD-10-CM | POA: Diagnosis not present

## 2020-01-19 DIAGNOSIS — M170 Bilateral primary osteoarthritis of knee: Secondary | ICD-10-CM | POA: Diagnosis not present

## 2020-01-19 DIAGNOSIS — I129 Hypertensive chronic kidney disease with stage 1 through stage 4 chronic kidney disease, or unspecified chronic kidney disease: Secondary | ICD-10-CM | POA: Diagnosis not present

## 2020-01-20 DIAGNOSIS — N182 Chronic kidney disease, stage 2 (mild): Secondary | ICD-10-CM | POA: Diagnosis not present

## 2020-01-20 DIAGNOSIS — I872 Venous insufficiency (chronic) (peripheral): Secondary | ICD-10-CM | POA: Diagnosis not present

## 2020-01-20 DIAGNOSIS — D509 Iron deficiency anemia, unspecified: Secondary | ICD-10-CM | POA: Diagnosis not present

## 2020-01-20 DIAGNOSIS — E7800 Pure hypercholesterolemia, unspecified: Secondary | ICD-10-CM | POA: Diagnosis not present

## 2020-01-20 DIAGNOSIS — M170 Bilateral primary osteoarthritis of knee: Secondary | ICD-10-CM | POA: Diagnosis not present

## 2020-01-20 DIAGNOSIS — I129 Hypertensive chronic kidney disease with stage 1 through stage 4 chronic kidney disease, or unspecified chronic kidney disease: Secondary | ICD-10-CM | POA: Diagnosis not present

## 2020-01-27 DIAGNOSIS — D509 Iron deficiency anemia, unspecified: Secondary | ICD-10-CM | POA: Diagnosis not present

## 2020-01-27 DIAGNOSIS — I129 Hypertensive chronic kidney disease with stage 1 through stage 4 chronic kidney disease, or unspecified chronic kidney disease: Secondary | ICD-10-CM | POA: Diagnosis not present

## 2020-01-27 DIAGNOSIS — N182 Chronic kidney disease, stage 2 (mild): Secondary | ICD-10-CM | POA: Diagnosis not present

## 2020-01-27 DIAGNOSIS — M170 Bilateral primary osteoarthritis of knee: Secondary | ICD-10-CM | POA: Diagnosis not present

## 2020-01-27 DIAGNOSIS — E7800 Pure hypercholesterolemia, unspecified: Secondary | ICD-10-CM | POA: Diagnosis not present

## 2020-01-27 DIAGNOSIS — I872 Venous insufficiency (chronic) (peripheral): Secondary | ICD-10-CM | POA: Diagnosis not present

## 2020-02-01 DIAGNOSIS — I872 Venous insufficiency (chronic) (peripheral): Secondary | ICD-10-CM | POA: Diagnosis not present

## 2020-02-01 DIAGNOSIS — N182 Chronic kidney disease, stage 2 (mild): Secondary | ICD-10-CM | POA: Diagnosis not present

## 2020-02-01 DIAGNOSIS — I129 Hypertensive chronic kidney disease with stage 1 through stage 4 chronic kidney disease, or unspecified chronic kidney disease: Secondary | ICD-10-CM | POA: Diagnosis not present

## 2020-02-01 DIAGNOSIS — M170 Bilateral primary osteoarthritis of knee: Secondary | ICD-10-CM | POA: Diagnosis not present

## 2020-02-01 DIAGNOSIS — D509 Iron deficiency anemia, unspecified: Secondary | ICD-10-CM | POA: Diagnosis not present

## 2020-02-01 DIAGNOSIS — E7800 Pure hypercholesterolemia, unspecified: Secondary | ICD-10-CM | POA: Diagnosis not present

## 2020-02-03 ENCOUNTER — Other Ambulatory Visit: Payer: Self-pay | Admitting: *Deleted

## 2020-02-03 DIAGNOSIS — N182 Chronic kidney disease, stage 2 (mild): Secondary | ICD-10-CM | POA: Diagnosis not present

## 2020-02-03 DIAGNOSIS — D509 Iron deficiency anemia, unspecified: Secondary | ICD-10-CM | POA: Diagnosis not present

## 2020-02-03 DIAGNOSIS — I129 Hypertensive chronic kidney disease with stage 1 through stage 4 chronic kidney disease, or unspecified chronic kidney disease: Secondary | ICD-10-CM | POA: Diagnosis not present

## 2020-02-03 DIAGNOSIS — I872 Venous insufficiency (chronic) (peripheral): Secondary | ICD-10-CM | POA: Diagnosis not present

## 2020-02-03 DIAGNOSIS — M170 Bilateral primary osteoarthritis of knee: Secondary | ICD-10-CM | POA: Diagnosis not present

## 2020-02-03 DIAGNOSIS — E7800 Pure hypercholesterolemia, unspecified: Secondary | ICD-10-CM | POA: Diagnosis not present

## 2020-02-03 NOTE — Patient Outreach (Addendum)
[place] [facility]) Care Management  02/03/2020  Molly Neal 01-31-1932 [id number]  Unsuccessful outreach attempt made to patient. RN Health Coach left HIPAA compliant voicemail message along with her contact information.  Plan: RN Health Coach will call patient within the month of October.   [name] RN, BSN [place] [phone] [name]

## 2020-02-06 DIAGNOSIS — I872 Venous insufficiency (chronic) (peripheral): Secondary | ICD-10-CM | POA: Diagnosis not present

## 2020-02-06 DIAGNOSIS — G8929 Other chronic pain: Secondary | ICD-10-CM | POA: Diagnosis not present

## 2020-02-06 DIAGNOSIS — Z87891 Personal history of nicotine dependence: Secondary | ICD-10-CM | POA: Diagnosis not present

## 2020-02-06 DIAGNOSIS — I129 Hypertensive chronic kidney disease with stage 1 through stage 4 chronic kidney disease, or unspecified chronic kidney disease: Secondary | ICD-10-CM | POA: Diagnosis not present

## 2020-02-06 DIAGNOSIS — J309 Allergic rhinitis, unspecified: Secondary | ICD-10-CM | POA: Diagnosis not present

## 2020-02-06 DIAGNOSIS — M170 Bilateral primary osteoarthritis of knee: Secondary | ICD-10-CM | POA: Diagnosis not present

## 2020-02-06 DIAGNOSIS — E7800 Pure hypercholesterolemia, unspecified: Secondary | ICD-10-CM | POA: Diagnosis not present

## 2020-02-06 DIAGNOSIS — F419 Anxiety disorder, unspecified: Secondary | ICD-10-CM | POA: Diagnosis not present

## 2020-02-06 DIAGNOSIS — F325 Major depressive disorder, single episode, in full remission: Secondary | ICD-10-CM | POA: Diagnosis not present

## 2020-02-06 DIAGNOSIS — K219 Gastro-esophageal reflux disease without esophagitis: Secondary | ICD-10-CM | POA: Diagnosis not present

## 2020-02-06 DIAGNOSIS — N182 Chronic kidney disease, stage 2 (mild): Secondary | ICD-10-CM | POA: Diagnosis not present

## 2020-02-06 DIAGNOSIS — K5790 Diverticulosis of intestine, part unspecified, without perforation or abscess without bleeding: Secondary | ICD-10-CM | POA: Diagnosis not present

## 2020-02-06 DIAGNOSIS — D509 Iron deficiency anemia, unspecified: Secondary | ICD-10-CM | POA: Diagnosis not present

## 2020-02-06 DIAGNOSIS — M810 Age-related osteoporosis without current pathological fracture: Secondary | ICD-10-CM | POA: Diagnosis not present

## 2020-02-06 DIAGNOSIS — I080 Rheumatic disorders of both mitral and aortic valves: Secondary | ICD-10-CM | POA: Diagnosis not present

## 2020-02-06 DIAGNOSIS — M25511 Pain in right shoulder: Secondary | ICD-10-CM | POA: Diagnosis not present

## 2020-02-07 DIAGNOSIS — D509 Iron deficiency anemia, unspecified: Secondary | ICD-10-CM | POA: Diagnosis not present

## 2020-02-07 DIAGNOSIS — E7800 Pure hypercholesterolemia, unspecified: Secondary | ICD-10-CM | POA: Diagnosis not present

## 2020-02-07 DIAGNOSIS — N182 Chronic kidney disease, stage 2 (mild): Secondary | ICD-10-CM | POA: Diagnosis not present

## 2020-02-07 DIAGNOSIS — M170 Bilateral primary osteoarthritis of knee: Secondary | ICD-10-CM | POA: Diagnosis not present

## 2020-02-07 DIAGNOSIS — I129 Hypertensive chronic kidney disease with stage 1 through stage 4 chronic kidney disease, or unspecified chronic kidney disease: Secondary | ICD-10-CM | POA: Diagnosis not present

## 2020-02-07 DIAGNOSIS — I872 Venous insufficiency (chronic) (peripheral): Secondary | ICD-10-CM | POA: Diagnosis not present

## 2020-02-14 DIAGNOSIS — I129 Hypertensive chronic kidney disease with stage 1 through stage 4 chronic kidney disease, or unspecified chronic kidney disease: Secondary | ICD-10-CM | POA: Diagnosis not present

## 2020-02-14 DIAGNOSIS — N182 Chronic kidney disease, stage 2 (mild): Secondary | ICD-10-CM | POA: Diagnosis not present

## 2020-02-14 DIAGNOSIS — D509 Iron deficiency anemia, unspecified: Secondary | ICD-10-CM | POA: Diagnosis not present

## 2020-02-14 DIAGNOSIS — I872 Venous insufficiency (chronic) (peripheral): Secondary | ICD-10-CM | POA: Diagnosis not present

## 2020-02-14 DIAGNOSIS — M170 Bilateral primary osteoarthritis of knee: Secondary | ICD-10-CM | POA: Diagnosis not present

## 2020-02-14 DIAGNOSIS — E7800 Pure hypercholesterolemia, unspecified: Secondary | ICD-10-CM | POA: Diagnosis not present

## 2020-02-15 DIAGNOSIS — E7800 Pure hypercholesterolemia, unspecified: Secondary | ICD-10-CM | POA: Diagnosis not present

## 2020-02-15 DIAGNOSIS — M1712 Unilateral primary osteoarthritis, left knee: Secondary | ICD-10-CM | POA: Diagnosis not present

## 2020-02-15 DIAGNOSIS — M170 Bilateral primary osteoarthritis of knee: Secondary | ICD-10-CM | POA: Diagnosis not present

## 2020-02-15 DIAGNOSIS — M810 Age-related osteoporosis without current pathological fracture: Secondary | ICD-10-CM | POA: Diagnosis not present

## 2020-02-15 DIAGNOSIS — F325 Major depressive disorder, single episode, in full remission: Secondary | ICD-10-CM | POA: Diagnosis not present

## 2020-02-15 DIAGNOSIS — I10 Essential (primary) hypertension: Secondary | ICD-10-CM | POA: Diagnosis not present

## 2020-02-15 DIAGNOSIS — I480 Paroxysmal atrial fibrillation: Secondary | ICD-10-CM | POA: Diagnosis not present

## 2020-02-21 DIAGNOSIS — I129 Hypertensive chronic kidney disease with stage 1 through stage 4 chronic kidney disease, or unspecified chronic kidney disease: Secondary | ICD-10-CM | POA: Diagnosis not present

## 2020-02-21 DIAGNOSIS — M170 Bilateral primary osteoarthritis of knee: Secondary | ICD-10-CM | POA: Diagnosis not present

## 2020-02-21 DIAGNOSIS — D509 Iron deficiency anemia, unspecified: Secondary | ICD-10-CM | POA: Diagnosis not present

## 2020-02-21 DIAGNOSIS — N182 Chronic kidney disease, stage 2 (mild): Secondary | ICD-10-CM | POA: Diagnosis not present

## 2020-02-21 DIAGNOSIS — I872 Venous insufficiency (chronic) (peripheral): Secondary | ICD-10-CM | POA: Diagnosis not present

## 2020-02-21 DIAGNOSIS — E7800 Pure hypercholesterolemia, unspecified: Secondary | ICD-10-CM | POA: Diagnosis not present

## 2020-02-22 DIAGNOSIS — I480 Paroxysmal atrial fibrillation: Secondary | ICD-10-CM | POA: Diagnosis not present

## 2020-02-22 DIAGNOSIS — I10 Essential (primary) hypertension: Secondary | ICD-10-CM | POA: Diagnosis not present

## 2020-02-22 DIAGNOSIS — Z79899 Other long term (current) drug therapy: Secondary | ICD-10-CM | POA: Diagnosis not present

## 2020-02-22 DIAGNOSIS — Z23 Encounter for immunization: Secondary | ICD-10-CM | POA: Diagnosis not present

## 2020-02-22 DIAGNOSIS — D6869 Other thrombophilia: Secondary | ICD-10-CM | POA: Diagnosis not present

## 2020-02-22 DIAGNOSIS — I499 Cardiac arrhythmia, unspecified: Secondary | ICD-10-CM | POA: Diagnosis not present

## 2020-02-29 ENCOUNTER — Ambulatory Visit (HOSPITAL_COMMUNITY)
Admission: RE | Admit: 2020-02-29 | Discharge: 2020-02-29 | Disposition: A | Discharge status: Home / Self Care | Payer: Medicare Other | Source: Ambulatory Visit | Attending: Physician Assistant | Admitting: Physician Assistant

## 2020-02-29 ENCOUNTER — Encounter (HOSPITAL_COMMUNITY): Payer: Self-pay | Admitting: Physician Assistant

## 2020-02-29 ENCOUNTER — Other Ambulatory Visit: Payer: Self-pay

## 2020-02-29 VITALS — BP 138/70 | HR 89 | Ht 61.0 in | Wt 114.2 lb

## 2020-02-29 DIAGNOSIS — I4819 Other persistent atrial fibrillation: Secondary | ICD-10-CM | POA: Diagnosis not present

## 2020-02-29 DIAGNOSIS — K219 Gastro-esophageal reflux disease without esophagitis: Secondary | ICD-10-CM | POA: Insufficient documentation

## 2020-02-29 DIAGNOSIS — I129 Hypertensive chronic kidney disease with stage 1 through stage 4 chronic kidney disease, or unspecified chronic kidney disease: Secondary | ICD-10-CM | POA: Diagnosis not present

## 2020-02-29 DIAGNOSIS — D6869 Other thrombophilia: Secondary | ICD-10-CM | POA: Insufficient documentation

## 2020-02-29 DIAGNOSIS — Z79899 Other long term (current) drug therapy: Secondary | ICD-10-CM | POA: Diagnosis not present

## 2020-02-29 DIAGNOSIS — E7800 Pure hypercholesterolemia, unspecified: Secondary | ICD-10-CM | POA: Insufficient documentation

## 2020-02-29 DIAGNOSIS — Z87891 Personal history of nicotine dependence: Secondary | ICD-10-CM | POA: Insufficient documentation

## 2020-02-29 DIAGNOSIS — N189 Chronic kidney disease, unspecified: Secondary | ICD-10-CM | POA: Diagnosis not present

## 2020-02-29 DIAGNOSIS — Z7901 Long term (current) use of anticoagulants: Secondary | ICD-10-CM | POA: Insufficient documentation

## 2020-02-29 HISTORY — DX: Gastro-esophageal reflux disease without esophagitis: K21.9

## 2020-02-29 HISTORY — DX: Nonrheumatic mitral (valve) insufficiency: I34.0

## 2020-02-29 HISTORY — DX: Depression, unspecified: F32.A

## 2020-02-29 HISTORY — DX: Chronic kidney disease, unspecified: N18.9

## 2020-02-29 HISTORY — DX: Iron deficiency anemia, unspecified: D50.9

## 2020-02-29 HISTORY — DX: Diverticulosis of intestine, part unspecified, without perforation or abscess without bleeding: K57.90

## 2020-02-29 HISTORY — DX: Nonrheumatic aortic (valve) insufficiency: I35.1

## 2020-02-29 HISTORY — DX: Anxiety disorder, unspecified: F41.9

## 2020-02-29 HISTORY — DX: Pure hypercholesterolemia, unspecified: E78.00

## 2020-02-29 NOTE — Progress Notes (Signed)
Primary Care Physician: Lajean Manes, MD Primary Cardiologist: none Primary Electrophysiologist: none Referring Physician: Dr Westley Hummer Molly Neal is a 86 y.o. female with a history of HTN, iron deficiency anemia, mild AI, mod MR, HLD, CKD, diverticulosis, and atrial fibrillation who presents for consultation in the [facility]. The patient was initially diagnosed with atrial fibrillation 02/22/20 at her PCP office incidentally during a routine visit. She was started on Eliquis for a CHADS2VASC score of 4 and her metoprolol was increased. She has not yet started the higher dose of metoprolol. She lives independently but is a difficult historian. She denies any palpitations, dizziness, increased fatigue, or CP. She feels she may have increased dyspnea with exertion but she is limited by her orthopedic issues.   Today, she denies symptoms of palpitations, chest pain, orthopnea, PND, lower extremity edema, dizziness, presyncope, syncope, snoring, daytime somnolence, bleeding, or neurologic sequela. The patient is tolerating medications without difficulties and is otherwise without complaint today.    Atrial Fibrillation Risk Factors:  she does not have symptoms or diagnosis of sleep apnea. she does not have a history of rheumatic fever.   she has a BMI of Body mass index is 21.58 kg/m.Marland Kitchen Filed Weights   02/29/20 1415  Weight: 51.8 kg    No family history on file.   Atrial Fibrillation Management history:  Previous antiarrhythmic drugs: none Previous cardioversions: none Previous ablations: none CHADS2VASC score: 4 Anticoagulation history: Eliquis    Past Medical History:  Diagnosis Date  . Anxiety   . Aortic insufficiency   . Arthritis   . Chronic kidney disease (CKD)   . Depression   . Diverticulosis   . GERD (gastroesophageal reflux disease)   . Hypercholesterolemia   . Hypertension   . Iron deficiency anemia   . Mitral  regurgitation    No past surgical history on file.  Current Outpatient Medications  Medication Sig Dispense Refill  . amLODipine (NORVASC) 10 MG tablet Take 10 mg by mouth daily.  6  . Ascorbic Acid (VITAMIN C PO) Take 1,000 mg by mouth daily.    . busPIRone (BUSPAR) 5 MG tablet Take 5 mg by mouth 2 (two) times daily.    . cetirizine (ZYRTEC) 10 MG tablet Take 10 mg by mouth every morning.    . Cholecalciferol (VITAMIN D3) 25 MCG (1000 UT) CAPS Take 1 capsule by mouth every morning.    Marland Kitchen ELIQUIS 2.5 MG TABS tablet Take 2.5 mg by mouth 2 (two) times daily.    . FEROSUL 325 (65 Fe) MG tablet Take 325 mg by mouth 2 (two) times daily.    Marland Kitchen HYDROcodone-acetaminophen (NORCO/VICODIN) 5-325 MG per tablet Take by mouth every 8 (eight) hours as needed. For 30 days  0  . LORazepam (ATIVAN) 0.5 MG tablet Take 0.5 mg by mouth 2 (two) times daily as needed.  2  . metoprolol succinate (TOPROL-XL) 50 MG 24 hr tablet Take 50 mg by mouth daily.  6  . sertraline (ZOLOFT) 50 MG tablet Take 50 mg by mouth daily.   10  . SHINGRIX injection     . traMADol (ULTRAM) 50 MG tablet Take 1 tablet (50 mg total) by mouth every 6 (six) hours as needed. (Patient not taking: Reported on 02/29/2020) 15 tablet 0   No current facility-administered medications for this encounter.    Allergies  Allergen Reactions  . Aspirin Swelling    Social History   Socioeconomic History  .  Marital status: Widowed    Spouse name: Not on file  . Number of children: 2  . Years of education: 77  . Highest education level: High school graduate  Occupational History  . Occupation: Retired  Tobacco Use  . Smoking status: Former Smoker    Packs/day: 0.25    Years: 10.00    Pack years: 2.50    Types: Cigarettes  . Smokeless tobacco: Never Used  . Tobacco comment: She smoked off and on  Vaping Use  . Vaping Use: Never assessed  Substance and Sexual Activity  . Alcohol use: No  . Drug use: No  . Sexual activity: Not Currently    Other Topics Concern  . Not on file  Social History Narrative  . Not on file   Social Determinants of Health   Financial Resource Strain: Low Risk   . Difficulty of Paying Living Expenses: Not very hard  Food Insecurity: No Food Insecurity  . Worried About Charity fundraiser in the Last Year: Never true  . Ran Out of Food in the Last Year: Never true  Transportation Needs: No Transportation Needs  . Lack of Transportation (Medical): No  . Lack of Transportation (Non-Medical): No  Physical Activity: Inactive  . Days of Exercise per Week: 0 days  . Minutes of Exercise per Session: 0 min  Stress: No Stress Concern Present  . Feeling of Stress : Only a little  Social Connections: Moderately Integrated  . Frequency of Communication with Friends and Family: More than three times a week  . Frequency of Social Gatherings with Friends and Family: Once a week  . Attends Religious Services: More than 4 times per year  . Active Member of Clubs or Organizations: Yes  . Attends Archivist Meetings: More than 4 times per year  . Marital Status: Widowed  Intimate Partner Violence: Not At Risk  . Fear of Current or Ex-Partner: No  . Emotionally Abused: No  . Physically Abused: No  . Sexually Abused: No     ROS- All systems are reviewed and negative except as per the HPI above.  Physical Exam: Vitals:   02/29/20 1415  BP: 138/70  Pulse: 89  Weight: 51.8 kg  Height: 5\' 1"  (1.549 m)    GEN- The patient is well appearing elderly female, alert and oriented x 3 today.   Head- normocephalic, atraumatic Eyes-  Sclera clear, conjunctiva pink Ears- hearing intact Oropharynx- clear Neck- supple  Lungs- Clear to ausculation bilaterally, normal work of breathing Heart- irregular rate and rhythm, no murmurs, rubs or gallops  GI- soft, NT, ND, + BS Extremities- no clubbing, cyanosis. Trace bilateral edema MS- no significant deformity or atrophy Skin- no rash or lesion Psych-  euthymic mood, full affect Neuro- strength and sensation are intact  Wt Readings from Last 3 Encounters:  02/29/20 51.8 kg  08/29/15 53.9 kg  07/20/14 56.7 kg    EKG today demonstrates afib HR 89, LAFB, slow R wave prog, QRS 88, QTc 438  Echo 12/17/16 demonstrated  - Left ventricle: The cavity size was normal. Wall thickness was  increased in a pattern of mild LVH. Systolic function was normal.  The estimated ejection fraction was in the range of 55% to 60%.  Wall motion was normal; there were no regional wall motion  abnormalities. Features are consistent with a pseudonormal left  ventricular filling pattern, with concomitant abnormal relaxation  and increased filling pressure (grade 2 diastolic dysfunction).  Doppler parameters are consistent  with high ventricular filling  pressure.  - Aortic valve: There was mild regurgitation.  - Mitral valve: There was mild to moderate regurgitation. Valve  area by pressure half-time: 2.12 cm^2.  - Left atrium: The atrium was moderately dilated.  - Right atrium: The atrium was mildly dilated.  - Tricuspid valve: There was mild-moderate regurgitation.  - Pulmonary arteries: Systolic pressure was mildly increased. PA  peak pressure: 40 mm Hg (S).   Impressions:   - Normal LV systolic function; moderate diastolic dysfunction; mild  LVH; mild AI; mild to moderate MR; biatrial enlargement; mild to  moderate TR; mildly elevated pulmonary pressure.   Epic records are reviewed at length today  CHA2DS2-VASc Score = 4  The patient's score is based upon: CHF History: 0 HTN History: 1 Diabetes History: 0 Stroke History: 0 Vascular Disease History: 0 Age Score: 2 Gender Score: 1      ASSESSMENT AND PLAN: 1. Persistent Atrial Fibrillation (ICD10:  I48.19) The patient's CHA2DS2-VASc score is 4, indicating a 4.8% annual risk of stroke.   General education about afib provided and questions answered. We also discussed  her stroke risk and the risks and benefits of anticoagulation. Continue Eliquis 2.5 mg BID (age, weight) Continue Toprol 50 mg daily (starting tomorrow) Check echocardiogram We discussed therapeutic options including rate vs rhythm control. Unclear if she is having symptoms with her afib. She would need to be on anticoagulation for 3 weeks before attempting to restore SR (started 10/5). Patient will consider her options in the meantime.   2. Secondary Hypercoagulable State (ICD10:  D68.69) The patient is at significant risk for stroke/thromboembolism based upon her CHA2DS2-VASc Score of 4.  Continue Apixaban (Eliquis).   3. HTN Stable, no changes today.  4. Valvular heart disease Mild AI, mod MR on echo 2018 Repeat echo as above.   Follow up in the AF clinic in 2 weeks.    [facility] [address] [place], [place] [address] [phone] 02/29/2020 3:32 PM

## 2020-03-01 DIAGNOSIS — I10 Essential (primary) hypertension: Secondary | ICD-10-CM | POA: Diagnosis not present

## 2020-03-01 DIAGNOSIS — E7800 Pure hypercholesterolemia, unspecified: Secondary | ICD-10-CM | POA: Diagnosis not present

## 2020-03-02 DIAGNOSIS — I129 Hypertensive chronic kidney disease with stage 1 through stage 4 chronic kidney disease, or unspecified chronic kidney disease: Secondary | ICD-10-CM | POA: Diagnosis not present

## 2020-03-02 DIAGNOSIS — N182 Chronic kidney disease, stage 2 (mild): Secondary | ICD-10-CM | POA: Diagnosis not present

## 2020-03-02 DIAGNOSIS — D509 Iron deficiency anemia, unspecified: Secondary | ICD-10-CM | POA: Diagnosis not present

## 2020-03-02 DIAGNOSIS — E7800 Pure hypercholesterolemia, unspecified: Secondary | ICD-10-CM | POA: Diagnosis not present

## 2020-03-02 DIAGNOSIS — I872 Venous insufficiency (chronic) (peripheral): Secondary | ICD-10-CM | POA: Diagnosis not present

## 2020-03-02 DIAGNOSIS — M170 Bilateral primary osteoarthritis of knee: Secondary | ICD-10-CM | POA: Diagnosis not present

## 2020-03-06 ENCOUNTER — Ambulatory Visit: Payer: Self-pay | Admitting: Podiatry

## 2020-03-07 DIAGNOSIS — M170 Bilateral primary osteoarthritis of knee: Secondary | ICD-10-CM | POA: Diagnosis not present

## 2020-03-07 DIAGNOSIS — K5790 Diverticulosis of intestine, part unspecified, without perforation or abscess without bleeding: Secondary | ICD-10-CM | POA: Diagnosis not present

## 2020-03-07 DIAGNOSIS — M810 Age-related osteoporosis without current pathological fracture: Secondary | ICD-10-CM | POA: Diagnosis not present

## 2020-03-07 DIAGNOSIS — I080 Rheumatic disorders of both mitral and aortic valves: Secondary | ICD-10-CM | POA: Diagnosis not present

## 2020-03-07 DIAGNOSIS — G8929 Other chronic pain: Secondary | ICD-10-CM | POA: Diagnosis not present

## 2020-03-07 DIAGNOSIS — F325 Major depressive disorder, single episode, in full remission: Secondary | ICD-10-CM | POA: Diagnosis not present

## 2020-03-07 DIAGNOSIS — I129 Hypertensive chronic kidney disease with stage 1 through stage 4 chronic kidney disease, or unspecified chronic kidney disease: Secondary | ICD-10-CM | POA: Diagnosis not present

## 2020-03-07 DIAGNOSIS — E7800 Pure hypercholesterolemia, unspecified: Secondary | ICD-10-CM | POA: Diagnosis not present

## 2020-03-07 DIAGNOSIS — K219 Gastro-esophageal reflux disease without esophagitis: Secondary | ICD-10-CM | POA: Diagnosis not present

## 2020-03-07 DIAGNOSIS — D509 Iron deficiency anemia, unspecified: Secondary | ICD-10-CM | POA: Diagnosis not present

## 2020-03-07 DIAGNOSIS — M25511 Pain in right shoulder: Secondary | ICD-10-CM | POA: Diagnosis not present

## 2020-03-07 DIAGNOSIS — N182 Chronic kidney disease, stage 2 (mild): Secondary | ICD-10-CM | POA: Diagnosis not present

## 2020-03-07 DIAGNOSIS — F419 Anxiety disorder, unspecified: Secondary | ICD-10-CM | POA: Diagnosis not present

## 2020-03-07 DIAGNOSIS — I872 Venous insufficiency (chronic) (peripheral): Secondary | ICD-10-CM | POA: Diagnosis not present

## 2020-03-07 DIAGNOSIS — Z87891 Personal history of nicotine dependence: Secondary | ICD-10-CM | POA: Diagnosis not present

## 2020-03-07 DIAGNOSIS — J309 Allergic rhinitis, unspecified: Secondary | ICD-10-CM | POA: Diagnosis not present

## 2020-03-09 ENCOUNTER — Other Ambulatory Visit: Payer: Self-pay | Admitting: *Deleted

## 2020-03-09 NOTE — Patient Outreach (Signed)
[facility]) Care Management  03/09/2020  Molly Neal 06-13-1931 [id number]  Outreach attempt to patient. No answer and unable to leave voicemail message due to after phone rang over 10 times there was no answer.   Plan: RN Health Coach will call patient within the month of November.  [name] RN, BSN [place] [phone] [name]

## 2020-03-14 ENCOUNTER — Ambulatory Visit (HOSPITAL_COMMUNITY): Payer: Medicare Other | Admitting: Physician Assistant

## 2020-03-14 ENCOUNTER — Ambulatory Visit (HOSPITAL_COMMUNITY): Admission: RE | Admit: 2020-03-14 | Payer: Medicare Other | Source: Ambulatory Visit

## 2020-03-16 DIAGNOSIS — M170 Bilateral primary osteoarthritis of knee: Secondary | ICD-10-CM | POA: Diagnosis not present

## 2020-03-16 DIAGNOSIS — D509 Iron deficiency anemia, unspecified: Secondary | ICD-10-CM | POA: Diagnosis not present

## 2020-03-16 DIAGNOSIS — I872 Venous insufficiency (chronic) (peripheral): Secondary | ICD-10-CM | POA: Diagnosis not present

## 2020-03-16 DIAGNOSIS — I129 Hypertensive chronic kidney disease with stage 1 through stage 4 chronic kidney disease, or unspecified chronic kidney disease: Secondary | ICD-10-CM | POA: Diagnosis not present

## 2020-03-16 DIAGNOSIS — E7800 Pure hypercholesterolemia, unspecified: Secondary | ICD-10-CM | POA: Diagnosis not present

## 2020-03-16 DIAGNOSIS — N182 Chronic kidney disease, stage 2 (mild): Secondary | ICD-10-CM | POA: Diagnosis not present

## 2020-03-21 ENCOUNTER — Ambulatory Visit (HOSPITAL_COMMUNITY)
Admission: RE | Admit: 2020-03-21 | Discharge: 2020-03-21 | Disposition: A | Discharge status: Home / Self Care | Payer: Medicare Other | Source: Ambulatory Visit | Attending: Physician Assistant | Admitting: Physician Assistant

## 2020-03-21 ENCOUNTER — Other Ambulatory Visit: Payer: Self-pay

## 2020-03-21 ENCOUNTER — Ambulatory Visit (HOSPITAL_BASED_OUTPATIENT_CLINIC_OR_DEPARTMENT_OTHER)
Admission: RE | Admit: 2020-03-21 | Discharge: 2020-03-21 | Disposition: A | Discharge status: Home / Self Care | Payer: Medicare Other | Source: Ambulatory Visit | Attending: Physician Assistant | Admitting: Physician Assistant

## 2020-03-21 VITALS — BP 160/86 | HR 89 | Ht 61.0 in | Wt 114.4 lb

## 2020-03-21 DIAGNOSIS — Z79899 Other long term (current) drug therapy: Secondary | ICD-10-CM | POA: Diagnosis not present

## 2020-03-21 DIAGNOSIS — D6869 Other thrombophilia: Secondary | ICD-10-CM

## 2020-03-21 DIAGNOSIS — Z7901 Long term (current) use of anticoagulants: Secondary | ICD-10-CM | POA: Insufficient documentation

## 2020-03-21 DIAGNOSIS — I083 Combined rheumatic disorders of mitral, aortic and tricuspid valves: Secondary | ICD-10-CM | POA: Insufficient documentation

## 2020-03-21 DIAGNOSIS — Z87891 Personal history of nicotine dependence: Secondary | ICD-10-CM | POA: Diagnosis not present

## 2020-03-21 DIAGNOSIS — I4819 Other persistent atrial fibrillation: Secondary | ICD-10-CM

## 2020-03-21 DIAGNOSIS — I10 Essential (primary) hypertension: Secondary | ICD-10-CM | POA: Insufficient documentation

## 2020-03-21 DIAGNOSIS — Z886 Allergy status to analgesic agent status: Secondary | ICD-10-CM | POA: Diagnosis not present

## 2020-03-21 LAB — ECHOCARDIOGRAM COMPLETE
Area-P 1/2: 3.84 cm2
Calc EF: 44.8 %
MV M vel: 5.1 m/s
MV Peak grad: 104 mmHg
P 1/2 time: 500 msec
Radius: 0.6 cm
S' Lateral: 2.8 cm
Single Plane A2C EF: 48.5 %
Single Plane A4C EF: 40.4 %

## 2020-03-21 MED ORDER — APIXABAN 2.5 MG PO TABS: 2.5000 mg | ORAL_TABLET | Freq: Two times a day (BID) | ORAL | 6 refills | Status: DC

## 2020-03-21 NOTE — Progress Notes (Addendum)
Primary Care Physician: Lajean Manes, MD Primary Cardiologist: none Primary Electrophysiologist: none Referring Physician: Dr Westley Hummer Molly Neal is a 86 y.o. female with a history of HTN, iron deficiency anemia, mild AI, mod MR, HLD, CKD, diverticulosis, and atrial fibrillation who presents for follow up in the [facility]. The patient was initially diagnosed with atrial fibrillation 02/22/20 at her PCP office incidentally during a routine visit. She was started on Eliquis for a CHADS2VASC score of 4 and her metoprolol was increased. She lives independently but is a difficult historian. She denies any palpitations, dizziness, increased fatigue, or CP. She feels she may have increased dyspnea with exertion but she is limited by her orthopedic issues.   On follow up today, patient reports she is about the same as her last visit. She does have chronic lower extremity edema but she feels this may be a little more recently. Her weight is stable, no orthopnea or PND. She is not aware of her arrhythmia. She had an echocardiogram early today and results are pending. She did miss a dose of Eliquis on 10/31.  Today, she denies symptoms of palpitations, chest pain, orthopnea, PND, lower extremity edema, dizziness, presyncope, syncope, snoring, daytime somnolence, bleeding, or neurologic sequela. The patient is tolerating medications without difficulties and is otherwise without complaint today.    Atrial Fibrillation Risk Factors:  she does not have symptoms or diagnosis of sleep apnea. she does not have a history of rheumatic fever.   she has a BMI of Body mass index is 21.62 kg/m.Marland Kitchen Filed Weights   03/21/20 1530  Weight: 51.9 kg    No family history on file.   Atrial Fibrillation Management history:  Previous antiarrhythmic drugs: none Previous cardioversions: none Previous ablations: none CHADS2VASC score: 4 Anticoagulation history: Eliquis     Past Medical History:  Diagnosis Date  . Anxiety   . Aortic insufficiency   . Arthritis   . Chronic kidney disease (CKD)   . Depression   . Diverticulosis   . GERD (gastroesophageal reflux disease)   . Hypercholesterolemia   . Hypertension   . Iron deficiency anemia   . Mitral regurgitation    No past surgical history on file.  Current Outpatient Medications  Medication Sig Dispense Refill  . amLODipine (NORVASC) 10 MG tablet Take 10 mg by mouth daily.  6  . Ascorbic Acid (VITAMIN C PO) Take 1,000 mg by mouth daily.    . busPIRone (BUSPAR) 5 MG tablet Take 5 mg by mouth 2 (two) times daily.    . cetirizine (ZYRTEC) 10 MG tablet Take 10 mg by mouth every morning.    . Cholecalciferol (VITAMIN D3) 25 MCG (1000 UT) CAPS Take 1 capsule by mouth every morning.    Marland Kitchen ELIQUIS 2.5 MG TABS tablet Take 2.5 mg by mouth 2 (two) times daily.    . FEROSUL 325 (65 Fe) MG tablet Take 325 mg by mouth daily.     Marland Kitchen HYDROcodone-acetaminophen (NORCO/VICODIN) 5-325 MG per tablet Take by mouth every 8 (eight) hours as needed. For 30 days  0  . LORazepam (ATIVAN) 0.5 MG tablet Take 0.5 mg by mouth 2 (two) times daily as needed.  2  . metoprolol succinate (TOPROL-XL) 50 MG 24 hr tablet Take 50 mg by mouth daily.  6  . sertraline (ZOLOFT) 50 MG tablet Take 50 mg by mouth daily.   10  . SHINGRIX injection     . traMADol (ULTRAM) 50  MG tablet Take 1 tablet (50 mg total) by mouth every 6 (six) hours as needed. 15 tablet 0   No current facility-administered medications for this encounter.    Allergies  Allergen Reactions  . Aspirin Swelling    Social History   Socioeconomic History  . Marital status: Widowed    Spouse name: Not on file  . Number of children: 2  . Years of education: 48  . Highest education level: High school graduate  Occupational History  . Occupation: Retired  Tobacco Use  . Smoking status: Former Smoker    Packs/day: 0.25    Years: 10.00    Pack years: 2.50    Types:  Cigarettes  . Smokeless tobacco: Never Used  . Tobacco comment: She smoked off and on  Vaping Use  . Vaping Use: Never assessed  Substance and Sexual Activity  . Alcohol use: No  . Drug use: No  . Sexual activity: Not Currently  Other Topics Concern  . Not on file  Social History Narrative  . Not on file   Social Determinants of Health   Financial Resource Strain: Low Risk   . Difficulty of Paying Living Expenses: Not very hard  Food Insecurity: No Food Insecurity  . Worried About Charity fundraiser in the Last Year: Never true  . Ran Out of Food in the Last Year: Never true  Transportation Needs: No Transportation Needs  . Lack of Transportation (Medical): No  . Lack of Transportation (Non-Medical): No  Physical Activity: Inactive  . Days of Exercise per Week: 0 days  . Minutes of Exercise per Session: 0 min  Stress: No Stress Concern Present  . Feeling of Stress : Only a little  Social Connections: Moderately Integrated  . Frequency of Communication with Friends and Family: More than three times a week  . Frequency of Social Gatherings with Friends and Family: Once a week  . Attends Religious Services: More than 4 times per year  . Active Member of Clubs or Organizations: Yes  . Attends Archivist Meetings: More than 4 times per year  . Marital Status: Widowed  Intimate Partner Violence: Not At Risk  . Fear of Current or Ex-Partner: No  . Emotionally Abused: No  . Physically Abused: No  . Sexually Abused: No     ROS- All systems are reviewed and negative except as per the HPI above.  Physical Exam: Vitals:   03/21/20 1530  BP: (!) 160/86  Pulse: 89  Weight: 51.9 kg  Height: 5\' 1"  (1.549 m)    GEN- The patient is well appearing elderly female, alert and oriented x 3 today.   HEENT-head normocephalic, atraumatic, sclera clear, conjunctiva pink, hearing intact, trachea midline. Lungs- Clear to ausculation bilaterally, normal work of breathing Heart-  irregular rate and rhythm, no murmurs, rubs or gallops  GI- soft, NT, ND, + BS Extremities- no clubbing, cyanosis. 1+ bilateral edema MS- no significant deformity or atrophy Skin- no rash or lesion Psych- euthymic mood, full affect Neuro- strength and sensation are intact   Wt Readings from Last 3 Encounters:  03/21/20 51.9 kg  02/29/20 51.8 kg  08/29/15 53.9 kg    EKG today demonstrates afib HR 89, QRS 84, QTc 389  Echo 12/17/16 demonstrated  - Left ventricle: The cavity size was normal. Wall thickness was  increased in a pattern of mild LVH. Systolic function was normal.  The estimated ejection fraction was in the range of 55% to 60%.  Wall  motion was normal; there were no regional wall motion  abnormalities. Features are consistent with a pseudonormal left  ventricular filling pattern, with concomitant abnormal relaxation  and increased filling pressure (grade 2 diastolic dysfunction).  Doppler parameters are consistent with high ventricular filling  pressure.  - Aortic valve: There was mild regurgitation.  - Mitral valve: There was mild to moderate regurgitation. Valve  area by pressure half-time: 2.12 cm^2.  - Left atrium: The atrium was moderately dilated.  - Right atrium: The atrium was mildly dilated.  - Tricuspid valve: There was mild-moderate regurgitation.  - Pulmonary arteries: Systolic pressure was mildly increased. PA  peak pressure: 40 mm Hg (S).   Impressions:   - Normal LV systolic function; moderate diastolic dysfunction; mild  LVH; mild AI; mild to moderate MR; biatrial enlargement; mild to  moderate TR; mildly elevated pulmonary pressure.   Epic records are reviewed at length today  CHA2DS2-VASc Score = 4  The patient's score is based upon: CHF History: 0 HTN History: 1 Diabetes History: 0 Stroke History: 0 Vascular Disease History: 0 Age Score: 2 Gender Score: 1      ASSESSMENT AND PLAN: 1. Persistent Atrial  Fibrillation (ICD10:  I48.19) The patient's CHA2DS2-VASc score is 4, indicating a 4.8% annual risk of stroke.   Patient remains in rate controlled afib.  Continue Eliquis 2.5 mg BID (age, weight). Stressed importance of compliance with anticoagulation.  Continue Toprol 50 mg daily We again discussed rate vs rhythm control. She is a difficult historian and it is unclear if she is having symptoms from her afib. If her EF is preserved, she may be a good candidate for rate control. Otherwise, would consider DCCV after 3 weeks of uninterrupted anticoagulation.   2. Secondary Hypercoagulable State (ICD10:  D68.69) The patient is at significant risk for stroke/thromboembolism based upon her CHA2DS2-VASc Score of 4.  Continue Apixaban (Eliquis).   3. HTN Mildly elevated today, controlled at previous visits. Will reevaluate at follow up, no changes today.  4. Valvular heart disease Mild AI, mod MR on echo 2018 Echo results pending.   Follow up in the AF clinic in 3 weeks.    [facility] [address] [place], [place] [address] [phone] 03/21/2020 4:26 PM

## 2020-03-21 NOTE — Progress Notes (Signed)
°  Echocardiogram 2D Echocardiogram with 3D has been performed.  Darlina Sicilian M 03/21/2020, 3:30 PM

## 2020-03-23 DIAGNOSIS — I480 Paroxysmal atrial fibrillation: Secondary | ICD-10-CM | POA: Diagnosis not present

## 2020-03-23 DIAGNOSIS — I872 Venous insufficiency (chronic) (peripheral): Secondary | ICD-10-CM | POA: Diagnosis not present

## 2020-03-23 DIAGNOSIS — I129 Hypertensive chronic kidney disease with stage 1 through stage 4 chronic kidney disease, or unspecified chronic kidney disease: Secondary | ICD-10-CM | POA: Diagnosis not present

## 2020-03-23 DIAGNOSIS — M170 Bilateral primary osteoarthritis of knee: Secondary | ICD-10-CM | POA: Diagnosis not present

## 2020-03-23 DIAGNOSIS — F325 Major depressive disorder, single episode, in full remission: Secondary | ICD-10-CM | POA: Diagnosis not present

## 2020-03-23 DIAGNOSIS — D509 Iron deficiency anemia, unspecified: Secondary | ICD-10-CM | POA: Diagnosis not present

## 2020-03-23 DIAGNOSIS — K219 Gastro-esophageal reflux disease without esophagitis: Secondary | ICD-10-CM | POA: Diagnosis not present

## 2020-03-23 DIAGNOSIS — M1712 Unilateral primary osteoarthritis, left knee: Secondary | ICD-10-CM | POA: Diagnosis not present

## 2020-03-23 DIAGNOSIS — E7800 Pure hypercholesterolemia, unspecified: Secondary | ICD-10-CM | POA: Diagnosis not present

## 2020-03-23 DIAGNOSIS — I10 Essential (primary) hypertension: Secondary | ICD-10-CM | POA: Diagnosis not present

## 2020-03-23 DIAGNOSIS — N182 Chronic kidney disease, stage 2 (mild): Secondary | ICD-10-CM | POA: Diagnosis not present

## 2020-03-23 DIAGNOSIS — M810 Age-related osteoporosis without current pathological fracture: Secondary | ICD-10-CM | POA: Diagnosis not present

## 2020-03-27 ENCOUNTER — Other Ambulatory Visit: Payer: Self-pay | Admitting: *Deleted

## 2020-03-27 DIAGNOSIS — N182 Chronic kidney disease, stage 2 (mild): Secondary | ICD-10-CM | POA: Diagnosis not present

## 2020-03-27 DIAGNOSIS — D509 Iron deficiency anemia, unspecified: Secondary | ICD-10-CM | POA: Diagnosis not present

## 2020-03-27 DIAGNOSIS — E7800 Pure hypercholesterolemia, unspecified: Secondary | ICD-10-CM | POA: Diagnosis not present

## 2020-03-27 DIAGNOSIS — M170 Bilateral primary osteoarthritis of knee: Secondary | ICD-10-CM | POA: Diagnosis not present

## 2020-03-27 DIAGNOSIS — I129 Hypertensive chronic kidney disease with stage 1 through stage 4 chronic kidney disease, or unspecified chronic kidney disease: Secondary | ICD-10-CM | POA: Diagnosis not present

## 2020-03-27 DIAGNOSIS — I872 Venous insufficiency (chronic) (peripheral): Secondary | ICD-10-CM | POA: Diagnosis not present

## 2020-03-27 NOTE — Patient Outreach (Signed)
[facility]) Care Management  03/27/2020  Molly Neal 16-Nov-1931 [id number]  Unsuccessful outreach attempt made to patient after receiving a VM from patient that she has not heard from this nurse in a while. RN Health Coach left HIPAA compliant voicemail message explaining that she has called patient numerous times and the patient's home phone will pick up with a busy signal and that typically the patient's cell voice mailbox is full and this nurse has not been able to leave her a VM. Nurse also explained that she has sent the patient an unsuccessful letter stating that this nurse has not been able to reach the patient.   Plan: RN Health Coach will call patient within the month of November and will send patient an unsuccessful letter.   [name] RN, BSN [place] [phone] [email or web address]

## 2020-03-29 ENCOUNTER — Ambulatory Visit: Payer: Self-pay | Admitting: *Deleted

## 2020-04-04 DIAGNOSIS — E7800 Pure hypercholesterolemia, unspecified: Secondary | ICD-10-CM | POA: Diagnosis not present

## 2020-04-04 DIAGNOSIS — I872 Venous insufficiency (chronic) (peripheral): Secondary | ICD-10-CM | POA: Diagnosis not present

## 2020-04-04 DIAGNOSIS — I129 Hypertensive chronic kidney disease with stage 1 through stage 4 chronic kidney disease, or unspecified chronic kidney disease: Secondary | ICD-10-CM | POA: Diagnosis not present

## 2020-04-04 DIAGNOSIS — D509 Iron deficiency anemia, unspecified: Secondary | ICD-10-CM | POA: Diagnosis not present

## 2020-04-04 DIAGNOSIS — N182 Chronic kidney disease, stage 2 (mild): Secondary | ICD-10-CM | POA: Diagnosis not present

## 2020-04-04 DIAGNOSIS — M170 Bilateral primary osteoarthritis of knee: Secondary | ICD-10-CM | POA: Diagnosis not present

## 2020-04-06 ENCOUNTER — Other Ambulatory Visit: Payer: Self-pay | Admitting: *Deleted

## 2020-04-06 NOTE — Patient Outreach (Signed)
[facility]) Care Management  [place]  04/06/2020   VIANNE GRIESHOP 03-28-1932 [id number]  Subjective: Successful telephone outreach call to patient. HIPAA identifiers obtained. Patient reports that she did not receive an in home aide when she previously applied several months ago. Per patient she does not understand why. Nurse explained that the last note the [facility] CSW entered did state she was approved for [place]. Nurse will place a CSW referral to inquire about the patient's status. Patient continues to explain that she is unable to safely bathe, prepare meals, and perform many IADLs by herself. She relies on her grandson Tristin who lives in [place] to phone in food delivery. She did state that she recently stayed with a friend while and her family super cleaned her apartment and made her environment safer. Patient denies having any falls but states she does need aide assistance to bathe, cook meals, go to the grocery store, and house cleaning. Patient does contact transportation assistance associated with her Medicaid for rides to her medical appointments and reports that an individual from [employer] explained that she should be getting meals delivered to her home within about a week.    Patient reports that she has been diagnosed with atrial fibrillation. She has started Eliquis and reports not having any difficulty affording her medications and that she is taking all of her medications as prescribed. Patient states that she has not been taking her B/P or pulse daily. Nurse provided education about the importance of taking her B/P and pulse daily and recording the values. Nurse strongly encouraged the patient to start doing this today and for her to bring her values to her upcoming cardiology appointment on 04/11/20 and all of her PCP appointments. Nurse will send patient hypertension and atrial fibrillation education and discussed for the patient to  contact her providers when her B/P and pulse values are too high or too low. Patient verbalized understanding. Patient did not have any further questions or concerns today. She did confirm that she has this nurse's contact number and will call her if needed.   Encounter Medications:  Outpatient Encounter Medications as of 04/06/2020  Medication Sig Note  . amLODipine (NORVASC) 10 MG tablet Take 10 mg by mouth daily.   Marland Kitchen apixaban (ELIQUIS) 2.5 MG TABS tablet Take 1 tablet (2.5 mg total) by mouth 2 (two) times daily.   . Ascorbic Acid (VITAMIN C PO) Take 1,000 mg by mouth daily.   . busPIRone (BUSPAR) 5 MG tablet Take 5 mg by mouth 2 (two) times daily.   . cetirizine (ZYRTEC) 10 MG tablet Take 10 mg by mouth every morning.   . Cholecalciferol (VITAMIN D3) 25 MCG (1000 UT) CAPS Take 1 capsule by mouth every morning.   Marland Kitchen FEROSUL 325 (65 Fe) MG tablet Take 325 mg by mouth daily.    Marland Kitchen HYDROcodone-acetaminophen (NORCO/VICODIN) 5-325 MG per tablet Take by mouth every 8 (eight) hours as needed. For 30 days   . LORazepam (ATIVAN) 0.5 MG tablet Take 0.5 mg by mouth 2 (two) times daily as needed.   . metoprolol succinate (TOPROL-XL) 50 MG 24 hr tablet Take 50 mg by mouth daily.   . sertraline (ZOLOFT) 50 MG tablet Take 50 mg by mouth daily.    . traMADol (ULTRAM) 50 MG tablet Take 1 tablet (50 mg total) by mouth every 6 (six) hours as needed.   Marland Kitchen SHINGRIX injection  (Patient not taking: Reported on 04/06/2020) 04/06/2020: Durene Cal  to pharmacy   No facility-administered encounter medications on file as of 04/06/2020.    Functional Status:  In your present state of health, do you have any difficulty performing the following activities: 07/29/2019 07/24/2019  Hearing? N N  Vision? N N  Difficulty concentrating or making decisions? N N  Walking or climbing stairs? Y Y  Comment due to pain in knees Pain in knees.  Dressing or bathing? N N  Doing errands, shopping? Y Y  Comment due to pain in knees Pain in  knees.  Preparing Food and eating ? Y Y  Comment due to pain in knees Pain in knees.  Using the Toilet? N N  In the past six months, have you accidently leaked urine? Y Y  Comment incontintent at times Occasional urinary incontinence.  Do you have problems with loss of bowel control? N N  Managing your Medications? N N  Managing your Finances? N N  Housekeeping or managing your Housekeeping? Y Y  Comment due to pain in knees Pain in knees.  Some recent data might be hidden    Fall/Depression Screening: Fall Risk  04/06/2020 04/06/2020 11/30/2019  Falls in the past year? 0 0 0  Number falls in past yr: 0 0 0  Injury with Fall? 0 0 0  Risk Factor Category  - - -  Risk for fall due to : Impaired mobility;Impaired balance/gait Impaired balance/gait;Impaired mobility Impaired balance/gait;Impaired mobility  Follow up Education provided;Falls prevention discussed;Falls evaluation completed Falls prevention discussed;Education provided;Falls evaluation completed Falls prevention discussed;Education provided;Falls evaluation completed   PHQ 2/9 Scores 07/29/2019 07/24/2019 07/14/2019 09/21/2015 08/29/2015  PHQ - 2 Score 0 0 0 0 0    Assessment:  Goals Addressed            This Visit's Progress   . Find Help in My Community       Follow Up Date 06/18/20    - call 29 when I need some help - follow-up on any referrals for help I am given - think ahead to make sure my need does not become an emergency - make a list of family or friends that I can call    Why is this important?   Knowing how and where to find help for yourself or family in your neighborhood and community is an important skill.  You will want to take some steps to learn how.    Notes: Nurse will place a CSW referral for patient assistance with receiving a home aide. Patient did state that [employer] should start sometime next week.     . Learn More About My Health       Follow Up Date 06/18/20    - make a list of  questions - ask questions - repeat what I heard to make sure I understand - bring a list of my medicines to the visit - speak up when I don't understand    Why is this important?   The best way to learn about your health and care is by talking to the doctor and nurse.  They will answer your questions and give you information in the way that you like best.    Notes: Nurse will send patient hypertension and atrial fibrillation education.     . Make and Keep All Appointments       Follow Up Date 06/18/20    - arrange a ride through an agency 1 week before appointment - call to cancel if needed -  keep a calendar with appointment dates    Why is this important?   Part of staying healthy is seeing the doctor for follow-up care.  If you forget your appointments, there are some things you can do to stay on track.    Notes: Nurse discussed contacting her transportation agency in plenty of time to ensure she has a ride to her medical appointments.     . Patient will verbalize taking her B/P daily and recording the value within the next 90 days.   Not on track    [place] (see longtitudinal plan of care for additional care plan information)  Objective:  . Last practice recorded BP readings:  BP Readings from Last 3 Encounters:  08/29/15 (!) 172/70  07/20/14 167/67  04/28/14 156/64 .   Marland Kitchen Most recent eGFR/CrCl: No results found for: EGFR  No components found for: CRCL  Current Barriers:  Marland Kitchen Knowledge deficit related to self care management of hypertension . Limited Social Support  Case Manager Clinical Goal(s):  Marland Kitchen Over the next 90 days, patient will verbalize understanding of plan for hypertension management . Over the next 90 days, patient will not experience hospital admission. Hospital Admissions in last 6 months = 0 . Over the next 90 days, patient will demonstrate improved adherence to prescribed treatment plan for hypertension as evidenced by taking all medications as  prescribed, monitoring and recording blood pressure as directed, adhering to low sodium/DASH diet  Interventions:  . Reviewed medications with patient and discussed importance of compliance . Advised patient, providing education and rationale, to monitor blood pressure daily and record, calling PCP for findings outside established parameters.  . Provided education regarding s/s of stroke and stroke prevention . Provided education regarding s/s of DASH diet/Low salt diet . Provided education regarding complications of uncontrolled blood pressure . Provided educational material: Matter of Choice Blood Pressure Control  Patient Self Care Activities:  . Self administers medications as prescribed . Nurse provided education about the importance of taking her B/P, pulse, and recording the values daily. Patient stated she would begin to take her pulse and B/P daily and would bring those values to her providers appointments.     Please see past updates related to this goal by clicking on the "Past Updates" button in the selected goal Updated: 04/06/20         . Track and Manage Heart Rate and Rhythm       Follow Up Date 06/18/20    - begin a symptom diary - bring symptom diary to all appointments - check pulse (heart) rate before taking medicine - check pulse (heart) rate once a day - make a plan to eat healthy - keep all lab appointments - take medicine as prescribed    Why is this important?   Atrial fibrillation may have no symptoms. Sometimes the symptoms get worse or happen more often.  It is important to keep track of what your symptoms are and when they happen.  A change in symptoms is important to discuss with your doctor or nurse.  Being active and healthy eating will also help you manage your heart condition.     Notes: Encouraged patient to begin to take her B/P and pulse daily and record the values. Nurse discussed for patient to bring the values to her provider appointments  and to contact providers for high and low values. Nurse will send patient atrial fibrillation education.    . Track and Manage My Blood Pressure  Follow Up Date 06/18/20    - check blood pressure daily - write blood pressure results in a log or diary    Why is this important?   You won't feel high blood pressure, but it can still hurt your blood vessels.  High blood pressure can cause heart or kidney problems. It can also cause a stroke.  Making lifestyle changes like losing a little weight or eating less salt will help.  Checking your blood pressure at home and at different times of the day can help to control blood pressure.  If the doctor prescribes medicine remember to take it the way the doctor ordered.  Call the office if you cannot afford the medicine or if there are questions about it.     Notes: Nurse provided education and encouraged patient to take B/P and pulse daily. Will send hypertension education.       Plan: RN Health Coach will send PCP today's assessment note, will send patient hypertension and atrial fibrillation education, and Ensure coupons, will call patient within the month of December, and patient agrees to future outreach calls.   [name] RN, BSN [place] [phone] [email or web address]

## 2020-04-06 NOTE — Patient Instructions (Signed)
Goals Addressed            This Visit's Progress   . Find Help in My Community       Follow Up Date 06/18/20    - call 59 when I need some help - follow-up on any referrals for help I am given - think ahead to make sure my need does not become an emergency - make a list of family or friends that I can call    Why is this important?   Knowing how and where to find help for yourself or family in your neighborhood and community is an important skill.  You will want to take some steps to learn how.    Notes: Nurse will place a CSW referral for patient assistance with receiving a home aide. Patient did state that Meals on Wheels should start sometime next week.     . Learn More About My Health       Follow Up Date 06/18/20    - make a list of questions - ask questions - repeat what I heard to make sure I understand - bring a list of my medicines to the visit - speak up when I don't understand    Why is this important?   The best way to learn about your health and care is by talking to the doctor and nurse.  They will answer your questions and give you information in the way that you like best.    Notes: Nurse will send patient hypertension and atrial fibrillation education.     . Make and Keep All Appointments       Follow Up Date 06/18/20    - arrange a ride through an agency 1 week before appointment - call to cancel if needed - keep a calendar with appointment dates    Why is this important?   Part of staying healthy is seeing the doctor for follow-up care.  If you forget your appointments, there are some things you can do to stay on track.    Notes: Nurse discussed contacting her transportation agency in plenty of time to ensure she has a ride to her medical appointments.     . Patient will verbalize taking her B/P daily and recording the value within the next 90 days.   Not on track    [place] (see longtitudinal plan of care for additional care plan  information)  Objective:  . Last practice recorded BP readings:  BP Readings from Last 3 Encounters:  08/29/15 (!) 172/70  07/20/14 167/67  04/28/14 156/64 .   Marland Kitchen Most recent eGFR/CrCl: No results found for: EGFR  No components found for: CRCL  Current Barriers:  Marland Kitchen Knowledge deficit related to self care management of hypertension . Limited Social Support  Case Manager Clinical Goal(s):  Marland Kitchen Over the next 90 days, patient will verbalize understanding of plan for hypertension management . Over the next 90 days, patient will not experience hospital admission. Hospital Admissions in last 6 months = 0 . Over the next 90 days, patient will demonstrate improved adherence to prescribed treatment plan for hypertension as evidenced by taking all medications as prescribed, monitoring and recording blood pressure as directed, adhering to low sodium/DASH diet  Interventions:  . Reviewed medications with patient and discussed importance of compliance . Advised patient, providing education and rationale, to monitor blood pressure daily and record, calling PCP for findings outside established parameters.  . Provided education regarding s/s of stroke and  stroke prevention . Provided education regarding s/s of DASH diet/Low salt diet . Provided education regarding complications of uncontrolled blood pressure . Provided educational material: Matter of Choice Blood Pressure Control  Patient Self Care Activities:  . Self administers medications as prescribed . Nurse provided education about the importance of taking her B/P, pulse, and recording the values daily. Patient stated she would begin to take her pulse and B/P daily and would bring those values to her providers appointments.     Please see past updates related to this goal by clicking on the "Past Updates" button in the selected goal Updated: 04/06/20         . Track and Manage Heart Rate and Rhythm       Follow Up Date 06/18/20    - begin  a symptom diary - bring symptom diary to all appointments - check pulse (heart) rate before taking medicine - check pulse (heart) rate once a day - make a plan to eat healthy - keep all lab appointments - take medicine as prescribed    Why is this important?   Atrial fibrillation may have no symptoms. Sometimes the symptoms get worse or happen more often.  It is important to keep track of what your symptoms are and when they happen.  A change in symptoms is important to discuss with your doctor or nurse.  Being active and healthy eating will also help you manage your heart condition.     Notes: Encouraged patient to begin to take her B/P and pulse daily and record the values. Nurse discussed for patient to bring the values to her provider appointments and to contact providers for high and low values. Nurse will send patient atrial fibrillation education.    . Track and Manage My Blood Pressure       Follow Up Date 06/18/20    - check blood pressure daily - write blood pressure results in a log or diary    Why is this important?   You won't feel high blood pressure, but it can still hurt your blood vessels.  High blood pressure can cause heart or kidney problems. It can also cause a stroke.  Making lifestyle changes like losing a little weight or eating less salt will help.  Checking your blood pressure at home and at different times of the day can help to control blood pressure.  If the doctor prescribes medicine remember to take it the way the doctor ordered.  Call the office if you cannot afford the medicine or if there are questions about it.     Notes: Nurse provided education and encouraged patient to take B/P and pulse daily. Will send hypertension education.

## 2020-04-11 ENCOUNTER — Ambulatory Visit (HOSPITAL_COMMUNITY)
Admission: RE | Admit: 2020-04-11 | Discharge: 2020-04-11 | Disposition: A | Discharge status: Home / Self Care | Payer: Medicare Other | Source: Ambulatory Visit | Attending: Physician Assistant | Admitting: Physician Assistant

## 2020-04-11 ENCOUNTER — Other Ambulatory Visit: Payer: Self-pay

## 2020-04-11 ENCOUNTER — Ambulatory Visit: Payer: Self-pay | Admitting: *Deleted

## 2020-04-11 VITALS — BP 148/66 | HR 81 | Ht 61.0 in | Wt 115.4 lb

## 2020-04-11 DIAGNOSIS — I10 Essential (primary) hypertension: Secondary | ICD-10-CM | POA: Diagnosis not present

## 2020-04-11 DIAGNOSIS — I080 Rheumatic disorders of both mitral and aortic valves: Secondary | ICD-10-CM | POA: Insufficient documentation

## 2020-04-11 DIAGNOSIS — Z87891 Personal history of nicotine dependence: Secondary | ICD-10-CM | POA: Diagnosis not present

## 2020-04-11 DIAGNOSIS — Z7901 Long term (current) use of anticoagulants: Secondary | ICD-10-CM | POA: Insufficient documentation

## 2020-04-11 DIAGNOSIS — D6869 Other thrombophilia: Secondary | ICD-10-CM | POA: Diagnosis not present

## 2020-04-11 DIAGNOSIS — I4821 Permanent atrial fibrillation: Secondary | ICD-10-CM | POA: Insufficient documentation

## 2020-04-11 NOTE — Progress Notes (Signed)
Primary Care Physician: Lajean Manes, MD Primary Cardiologist: none Primary Electrophysiologist: none Referring Physician: Dr Westley Hummer Molly Neal is a 86 y.o. female with a history of HTN, iron deficiency anemia, mild AI, mod MR, HLD, CKD, diverticulosis, and atrial fibrillation who presents for follow up in the [facility]. The patient was initially diagnosed with atrial fibrillation 02/22/20 at her PCP office incidentally during a routine visit. She was started on Eliquis for a CHADS2VASC score of 4 and her metoprolol was increased. She lives independently but is a difficult historian. She denies any palpitations, dizziness, increased fatigue, or CP. She feels she may have increased dyspnea with exertion but she is limited by her orthopedic issues.   On follow up today, patient reports she has done well from a cardiac standpoint. Echo showed preserved EF 60-65%, mod MR, mild AI. She is unaware of her arrhythmia and rate controlled. Her primary concerns are her orthopedic knee and foot issues. She denies any bleeding issues on anticoagulation.   Today, she denies symptoms of palpitations, chest pain, orthopnea, PND, dizziness, presyncope, syncope, snoring, daytime somnolence, bleeding, or neurologic sequela. The patient is tolerating medications without difficulties and is otherwise without complaint today.    Atrial Fibrillation Risk Factors:  she does not have symptoms or diagnosis of sleep apnea. she does not have a history of rheumatic fever.   she has a BMI of Body mass index is 21.8 kg/m.Marland Kitchen Filed Weights   04/11/20 1511  Weight: 52.3 kg    No family history on file.   Atrial Fibrillation Management history:  Previous antiarrhythmic drugs: none Previous cardioversions: none Previous ablations: none CHADS2VASC score: 4 Anticoagulation history: Eliquis    Past Medical History:  Diagnosis Date  . Anxiety   . Aortic insufficiency    . Arthritis   . Chronic kidney disease (CKD)   . Depression   . Diverticulosis   . GERD (gastroesophageal reflux disease)   . Hypercholesterolemia   . Hypertension   . Iron deficiency anemia   . Mitral regurgitation    No past surgical history on file.  Current Outpatient Medications  Medication Sig Dispense Refill  . amLODipine (NORVASC) 10 MG tablet Take 10 mg by mouth daily.  6  . apixaban (ELIQUIS) 2.5 MG TABS tablet Take 1 tablet (2.5 mg total) by mouth 2 (two) times daily. 60 tablet 6  . Ascorbic Acid (VITAMIN C PO) Take 1,000 mg by mouth daily.    . busPIRone (BUSPAR) 5 MG tablet Take 5 mg by mouth 2 (two) times daily.    . cetirizine (ZYRTEC) 10 MG tablet Take 10 mg by mouth every morning.    . Cholecalciferol (VITAMIN D3) 25 MCG (1000 UT) CAPS Take 1 capsule by mouth every morning.    Marland Kitchen FEROSUL 325 (65 Fe) MG tablet Take 325 mg by mouth daily.     Marland Kitchen HYDROcodone-acetaminophen (NORCO/VICODIN) 5-325 MG per tablet Take by mouth every 8 (eight) hours as needed. For 30 days  0  . LORazepam (ATIVAN) 0.5 MG tablet Take 0.5 mg by mouth 2 (two) times daily as needed.  2  . metoprolol succinate (TOPROL-XL) 50 MG 24 hr tablet Take 50 mg by mouth daily.  6  . NON FORMULARY SMARTSIG:1 Tablet(s) By Mouth Twice Daily    . sertraline (ZOLOFT) 50 MG tablet Take 50 mg by mouth daily.   10  . SHINGRIX injection     . traMADol (ULTRAM) 50 MG tablet  Take 1 tablet (50 mg total) by mouth every 6 (six) hours as needed. 15 tablet 0   No current facility-administered medications for this encounter.    Allergies  Allergen Reactions  . Aspirin Swelling    Social History   Socioeconomic History  . Marital status: Widowed    Spouse name: Not on file  . Number of children: 2  . Years of education: 7  . Highest education level: High school graduate  Occupational History  . Occupation: Retired  Tobacco Use  . Smoking status: Former Smoker    Packs/day: 0.25    Years: 10.00    Pack  years: 2.50    Types: Cigarettes  . Smokeless tobacco: Never Used  . Tobacco comment: She smoked off and on  Vaping Use  . Vaping Use: Never assessed  Substance and Sexual Activity  . Alcohol use: No  . Drug use: No  . Sexual activity: Not Currently  Other Topics Concern  . Not on file  Social History Narrative  . Not on file   Social Determinants of Health   Financial Resource Strain: Low Risk   . Difficulty of Paying Living Expenses: Not very hard  Food Insecurity: No Food Insecurity  . Worried About Charity fundraiser in the Last Year: Never true  . Ran Out of Food in the Last Year: Never true  Transportation Needs: No Transportation Needs  . Lack of Transportation (Medical): No  . Lack of Transportation (Non-Medical): No  Physical Activity: Inactive  . Days of Exercise per Week: 0 days  . Minutes of Exercise per Session: 0 min  Stress: No Stress Concern Present  . Feeling of Stress : Only a little  Social Connections: Moderately Integrated  . Frequency of Communication with Friends and Family: More than three times a week  . Frequency of Social Gatherings with Friends and Family: Once a week  . Attends Religious Services: More than 4 times per year  . Active Member of Clubs or Organizations: Yes  . Attends Archivist Meetings: More than 4 times per year  . Marital Status: Widowed  Intimate Partner Violence: Not At Risk  . Fear of Current or Ex-Partner: No  . Emotionally Abused: No  . Physically Abused: No  . Sexually Abused: No     ROS- All systems are reviewed and negative except as per the HPI above.  Physical Exam: Vitals:   04/11/20 1511  BP: (!) 148/66  Pulse: 81  Weight: 52.3 kg  Height: 5\' 1"  (1.549 m)    GEN- The patient is well appearing elderly female, alert and oriented x 3 today.   HEENT-head normocephalic, atraumatic, sclera clear, conjunctiva pink, hearing intact, trachea midline. Lungs- Clear to ausculation bilaterally, normal  work of breathing Heart- irregular rate and rhythm, no murmurs, rubs or gallops  GI- soft, NT, ND, + BS Extremities- no clubbing, cyanosis. Nonpitting bilateral foot edema. MS- no significant deformity or atrophy Skin- no rash or lesion Psych- euthymic mood, full affect Neuro- strength and sensation are intact   Wt Readings from Last 3 Encounters:  04/11/20 52.3 kg  03/21/20 51.9 kg  02/29/20 51.8 kg    EKG today demonstrates afib HR 81, LAFB, slow R wave prog, QRS 94, QTc 404  Echo 03/21/20 demonstrated  1. Left ventricular ejection fraction, by estimation, is 60 to 65%. The  left ventricle has normal function. The left ventricle has no regional  wall motion abnormalities. Left ventricular diastolic function could not  be evaluated.  2. Right ventricular systolic function is normal. The right ventricular  size is normal. There is moderately elevated pulmonary artery systolic  pressure.  3. Left atrial size was mildly dilated.  4. The mitral valve is normal in structure. Moderate mitral valve  regurgitation.  5. Tricuspid valve regurgitation is moderate.  6. The aortic valve is normal in structure. Aortic valve regurgitation is  mild. No aortic stenosis is present.  Epic records are reviewed at length today  CHA2DS2-VASc Score = 4  The patient's score is based upon: CHF History: 0 HTN History: 1 Diabetes History: 0 Stroke History: 0 Vascular Disease History: 0 Age Score: 2 Gender Score: 1      ASSESSMENT AND PLAN: 1. Permanent Atrial Fibrillation The patient's CHA2DS2-VASc score is 4, indicating a 4.8% annual risk of stroke.   We had another discussion about rate vs rhythm control. She is not very excited about attempting cardioversion. Given this and her advanced age, paucity of symptoms, and preserved EF, will pursue a rate control strategy. Patient in agreement with plan.  Continue Eliquis 2.5 mg BID (age, weight). Continue Toprol 50 mg daily  2. Secondary  Hypercoagulable State (ICD10:  D68.69) The patient is at significant risk for stroke/thromboembolism based upon her CHA2DS2-VASc Score of 4.  Continue Apixaban (Eliquis).   3. HTN Stable, no changes today.  4. Valvular heart disease Mild AI, mod MR. Unchanged from 2018.   Will refer to cardiology to establish care. AF clinic as needed.    [facility] [address] [place], [place] [address] [phone] 04/11/2020 3:27 PM

## 2020-04-13 ENCOUNTER — Other Ambulatory Visit: Payer: Self-pay | Admitting: *Deleted

## 2020-04-13 NOTE — Patient Outreach (Signed)
[place] [facility]) Care Management  04/13/2020  Molly Neal August 31, 1931 [id number]   CSW received referral for [facility] assistance and made contact with pt on 04/12/2020.  CSW confirmed pt's identity and introduced self, role and reason for call. Per pt, she is needing personal care assistance in the home. Pt lives alone, does not drive and has Medicaid. Per chart review, pt was referred to [facility] earlier this year and was approved in July. Services were to begin.   CSW will outreach [facility] and inquire about referral status.   CSW will update pt once info is received.   Pt also shared with CSW she has been receiving [employer] and she has been approved and expecting meals on wheels to begin soon.    Eduard Clos, MSW, [place] Worker  [place] [phone]

## 2020-04-18 ENCOUNTER — Other Ambulatory Visit: Payer: Self-pay | Admitting: *Deleted

## 2020-04-18 NOTE — Patient Outreach (Signed)
[place] [facility]) Care Management  04/18/2020  Molly Neal [birth year] [id number]   CSW made contact with pt to update her on PCS. CSW spoke with [facility] [place] HC/PCS program staff who indicate pt was not approved for PCS back in July as previously documented by CSW following at that time. They also indicate receiving additional paperwork earlier this month but it was not complete. Given the uncertainty of who/where that paperwork originated (pt is unable to recall) CSW will request PCP office complete the paperwork again.  CSW will also mail pt the form she will need to complete.  CSW will plan a follow up call to pt once communication/confirmation of PCP paperwork completed and faxed.      Eduard Clos, MSW, [place] Worker  [place] [phone]

## 2020-04-25 ENCOUNTER — Other Ambulatory Visit: Payer: Self-pay | Admitting: *Deleted

## 2020-04-25 ENCOUNTER — Ambulatory Visit: Payer: Self-pay | Admitting: *Deleted

## 2020-04-25 DIAGNOSIS — I10 Essential (primary) hypertension: Secondary | ICD-10-CM | POA: Diagnosis not present

## 2020-04-25 DIAGNOSIS — M170 Bilateral primary osteoarthritis of knee: Secondary | ICD-10-CM | POA: Diagnosis not present

## 2020-04-25 DIAGNOSIS — D6869 Other thrombophilia: Secondary | ICD-10-CM | POA: Diagnosis not present

## 2020-04-25 DIAGNOSIS — I480 Paroxysmal atrial fibrillation: Secondary | ICD-10-CM | POA: Diagnosis not present

## 2020-04-25 NOTE — Patient Outreach (Signed)
[place] [facility]) Care Management  04/25/2020  Molly Neal 1932/04/09 [id number]   CSW attempted to reach pt today for updates on the [facility] application paperwork.  CSW was able to leave a HIPPA compliant voice message and will attempt outreach again in 3-4 business days if no return call is received.   Eduard Clos, MSW, [place] Worker  [place] [phone]

## 2020-04-26 ENCOUNTER — Other Ambulatory Visit: Payer: Self-pay | Admitting: *Deleted

## 2020-04-26 NOTE — Patient Outreach (Signed)
[place] [facility]) Care Management  04/26/2020  Molly Neal 03-14-1932 [id number]  Unsuccessful outreach attempt made to patient. RN Health Coach left HIPAA compliant voicemail message along with her contact information.  Plan: RN Health Coach will call patient within the month of January.  [name] RN, BSN [place] [phone] [email or web address]

## 2020-04-27 ENCOUNTER — Other Ambulatory Visit: Payer: Self-pay | Admitting: *Deleted

## 2020-04-27 NOTE — Patient Outreach (Signed)
[place] [facility]) Care Management  04/27/2020  DAMYAH GUGEL [birth year] [id number]  Successful incoming telephone call with patient's grandson Tristin. HIPAA identifiers obtained. Tristin explained that he was inquiring about the patient applying for PCS. Grandson asked this nurse questions that she did not feel qualified to answer. Nurse gave Tristin LCSW Marcie Bal Caldwell's contact number to call to ask further questions. She also informed Tristin that LCSW plans to reach out to the patient tomorrow to assist with the application process and asked that he let his grandmother know she will be called on 04/28/20. Yolanda Bonine stated that he would make his grandmother aware of the upcoming call.   Plan: RN Health Coach will call patient within the month of January.  [name] RN, BSN [place] [phone] [email or web address]

## 2020-04-28 ENCOUNTER — Other Ambulatory Visit: Payer: Self-pay | Admitting: *Deleted

## 2020-04-28 NOTE — Patient Outreach (Signed)
[place] [facility]) Care Management  04/28/2020  Molly Neal [birth year] [id number]    [place] spoke with pt who reports she is dealing with an eye issue; saw PCP and they assessed it, "I does not seem to be better".  Encouraged pt to call PCP if not better by noon.  Reminded pt of CSW attempting to assist with the [facility] paperwork and that we are awaiting communication back from PCP office about paperwork.    CSW advised pt of call request from pt's grandson. Pt gives CSW permission to call Dewaine Oats, her grandson.  CSW made contact with Helyn App who reports he will be visiting pt this weekend.  CSW offered to email PCS application to him. He will then assist pt with completing the patient portion and then take to PCP office for their portion and for faxing in.  CSW reminded them to call CSW if needs arise during this process.   CSW will plan a follow up call to pt next week.     Eduard Clos, MSW, [place] Worker  [place] [phone]

## 2020-05-01 NOTE — Progress Notes (Deleted)
Cardiology Office Note:    Date:  05/01/2020   ID:  Molly Neal, DOB 12-23-1931, MRN [id number]  PCP:  Lajean Manes, MD  [facility] Cardiologist:  No primary care provider on file.  [facility] Electrophysiologist:  None   Referring MD: Oliver Barre, PA   History of Present Illness:    Molly Neal is a 86 y.o. female with a hx of permanent Afib, HTN, mild AI, moderate MR, HLD, and CKD who was referred by Malka So, PA to establish care for ongoing management of her Afib, valvular disease and HTN.  The patient was initially diagnosed with Afib at her visit with her PCP on 02/22/20 during a routine visit. She was started on apixaban for anticoagulation and metoprolol for rate control. May have some mild increase in DOE but denied any palpitations, chest pain, dizziness or fatigue. TTE with normal EF, mod MR, mild AI.   Past Medical History:  Diagnosis Date  . Anxiety   . Aortic insufficiency   . Arthritis   . Chronic kidney disease (CKD)   . Depression   . Diverticulosis   . GERD (gastroesophageal reflux disease)   . Hypercholesterolemia   . Hypertension   . Iron deficiency anemia   . Mitral regurgitation     No past surgical history on file.  Current Medications: No outpatient medications have been marked as taking for the 05/04/20 encounter (Appointment) with Freada Bergeron, MD.     Allergies:   Aspirin   Social History   Socioeconomic History  . Marital status: Widowed    Spouse name: Not on file  . Number of children: 2  . Years of education: 71  . Highest education level: High school graduate  Occupational History  . Occupation: Retired  Tobacco Use  . Smoking status: Former Smoker    Packs/day: 0.25    Years: 10.00    Pack years: 2.50    Types: Cigarettes  . Smokeless tobacco: Never Used  . Tobacco comment: She smoked off and on  Vaping Use  . Vaping Use: Not on file  Substance and Sexual Activity  . Alcohol use: No   . Drug use: No  . Sexual activity: Not Currently  Other Topics Concern  . Not on file  Social History Narrative  . Not on file   Social Determinants of Health   Financial Resource Strain: Low Risk   . Difficulty of Paying Living Expenses: Not very hard  Food Insecurity: No Food Insecurity  . Worried About Charity fundraiser in the Last Year: Never true  . Ran Out of Food in the Last Year: Never true  Transportation Needs: No Transportation Needs  . Lack of Transportation (Medical): No  . Lack of Transportation (Non-Medical): No  Physical Activity: Inactive  . Days of Exercise per Week: 0 days  . Minutes of Exercise per Session: 0 min  Stress: No Stress Concern Present  . Feeling of Stress : Only a little  Social Connections: Moderately Integrated  . Frequency of Communication with Friends and Family: More than three times a week  . Frequency of Social Gatherings with Friends and Family: Once a week  . Attends Religious Services: More than 4 times per year  . Active Member of Clubs or Organizations: Yes  . Attends Archivist Meetings: More than 4 times per year  . Marital Status: Widowed     Family History: The patient's ***family history is not on file.  ROS:   Please see the history of present illness.    *** All other systems reviewed and are negative.  EKGs/Labs/Other Studies Reviewed:    The following studies were reviewed today: Echo 03/21/20 demonstrated  1. Left ventricular ejection fraction, by estimation, is 60 to 65%. The  left ventricle has normal function. The left ventricle has no regional  wall motion abnormalities. Left ventricular diastolic function could not  be evaluated.  2. Right ventricular systolic function is normal. The right ventricular  size is normal. There is moderately elevated pulmonary artery systolic  pressure.  3. Left atrial size was mildly dilated.  4. The mitral valve is normal in structure. Moderate mitral valve   regurgitation.  5. Tricuspid valve regurgitation is moderate.  6. The aortic valve is normal in structure. Aortic valve regurgitation is  mild. No aortic stenosis is present.   EKG:  EKG is *** ordered today.  The ekg ordered today demonstrates ***  Recent Labs: No results found for requested labs within last 8760 hours.  Recent Lipid Panel No results found for: CHOL, TRIG, HDL, CHOLHDL, VLDL, LDLCALC, LDLDIRECT   Risk Assessment/Calculations:   {Does this patient have ATRIAL FIBRILLATION?:[phone]}   Physical Exam:    VS:  There were no vitals taken for this visit.    Wt Readings from Last 3 Encounters:  04/11/20 115 lb 6.4 oz (52.3 kg)  03/21/20 114 lb 6.4 oz (51.9 kg)  02/29/20 114 lb 3.2 oz (51.8 kg)     GEN: *** Well nourished, well developed in no acute distress HEENT: Normal NECK: No JVD; No carotid bruits LYMPHATICS: No lymphadenopathy CARDIAC: ***RRR, no murmurs, rubs, gallops RESPIRATORY:  Clear to auscultation without rales, wheezing or rhonchi  ABDOMEN: Soft, non-tender, non-distended MUSCULOSKELETAL:  No edema; No deformity  SKIN: Warm and dry NEUROLOGIC:  Alert and oriented x 3 PSYCHIATRIC:  Normal affect   ASSESSMENT:    No diagnosis found. PLAN:    In order of problems listed above:  #Permanent Afib: CHADs-vasc 4. Declined DCCV and opted for rate control at this time. TTE with preserved LV function.  -Continue eliquis 2.5mg  BID -Continue metoprolol 50mg  daily  #HTN  #Moderate MR #Mild AI: -Serial monitoring with TTEs   {Are you ordering a CV Procedure (e.g. stress test, cath, DCCV, TEE, etc)?   Press F2        :[id number]}    Medication Adjustments/Labs and Tests Ordered: Current medicines are reviewed at length with the patient today.  Concerns regarding medicines are outlined above.  No orders of the defined types were placed in this encounter.  No orders of the defined types were placed in this encounter.   There are no  Patient Instructions on file for this visit.   Signed, Freada Bergeron, MD  05/01/2020 1:41 PM    [place]

## 2020-05-03 ENCOUNTER — Other Ambulatory Visit: Payer: Self-pay | Admitting: *Deleted

## 2020-05-03 NOTE — Patient Outreach (Signed)
[name]) Care Management  05/03/2020  ENDIYA KLAHR 03/20/1932 [id number]   CSW spoke with pt who had not updates. CSW also contacted pt's grandson per her permission. Per grandson, Helyn App, he will received my email with the [facility] paperwork and is planning to print it, complete the pt's part and drop off to PCP for their part. CSW instructed grandson on the final steps of faxing and he will advise CSW once this has been done.  He is hoping to come in the next few days.    CSW will plan a follow up call next week for updates.  Eduard Clos, MSW, LCSW Clinical Social Worker  [place], MSW, [place] Worker  [place] [phone]

## 2020-05-04 ENCOUNTER — Ambulatory Visit: Payer: Medicare Other | Admitting: Cardiology

## 2020-05-08 ENCOUNTER — Other Ambulatory Visit: Payer: Self-pay | Admitting: *Deleted

## 2020-05-08 NOTE — Patient Outreach (Signed)
[place] [facility] [place] [facility]) Care Management  05/08/2020  Molly Neal Aug 19, 1931 [id number]   [place] spoke with Helyn App, pt's grandson, who is en route to [place] now. He plans to drop off PCS paperwork at PCP office for completion while here.  CSW reminded him to call if needs arise prior to follow up call the first week of January.   Eduard Clos, MSW, [place] Worker  [place] [phone]

## 2020-05-16 DIAGNOSIS — I480 Paroxysmal atrial fibrillation: Secondary | ICD-10-CM | POA: Diagnosis not present

## 2020-05-16 DIAGNOSIS — D509 Iron deficiency anemia, unspecified: Secondary | ICD-10-CM | POA: Diagnosis not present

## 2020-05-16 DIAGNOSIS — I10 Essential (primary) hypertension: Secondary | ICD-10-CM | POA: Diagnosis not present

## 2020-05-16 DIAGNOSIS — E7800 Pure hypercholesterolemia, unspecified: Secondary | ICD-10-CM | POA: Diagnosis not present

## 2020-05-16 DIAGNOSIS — I129 Hypertensive chronic kidney disease with stage 1 through stage 4 chronic kidney disease, or unspecified chronic kidney disease: Secondary | ICD-10-CM | POA: Diagnosis not present

## 2020-05-16 DIAGNOSIS — M170 Bilateral primary osteoarthritis of knee: Secondary | ICD-10-CM | POA: Diagnosis not present

## 2020-05-16 DIAGNOSIS — M810 Age-related osteoporosis without current pathological fracture: Secondary | ICD-10-CM | POA: Diagnosis not present

## 2020-05-16 DIAGNOSIS — K219 Gastro-esophageal reflux disease without esophagitis: Secondary | ICD-10-CM | POA: Diagnosis not present

## 2020-05-16 DIAGNOSIS — F325 Major depressive disorder, single episode, in full remission: Secondary | ICD-10-CM | POA: Diagnosis not present

## 2020-05-16 DIAGNOSIS — N182 Chronic kidney disease, stage 2 (mild): Secondary | ICD-10-CM | POA: Diagnosis not present

## 2020-05-22 ENCOUNTER — Ambulatory Visit: Payer: Self-pay | Admitting: *Deleted

## 2020-05-22 ENCOUNTER — Other Ambulatory Visit: Payer: Self-pay | Admitting: *Deleted

## 2020-05-22 NOTE — Patient Outreach (Signed)
[facility]) Care Management  05/22/2020  KEMISHA BONNETTE September 07, 1931 [id number]  CSW attempted to reach pt/family today and was unable to reach.  CSW was able to leave a voicemail message and will await callback.  CSW will outreach again in 3-4 days if no return call is received.   Reece Levy, MSW, LCSW Clinical Social Worker  [facility] [employer] [phone]

## 2020-05-23 ENCOUNTER — Ambulatory Visit: Payer: Medicare Other | Admitting: Internal Medicine

## 2020-05-23 NOTE — Progress Notes (Deleted)
Cardiology Office Note:    Date:  05/23/2020   ID:  Molly Neal, DOB [birth year], MRN [id number]  PCP:  Merlene LaughterStoneking, Hal, MD  [facility] Cardiologist:  No primary care provider on file.  [facility] Electrophysiologist:  None   Referring MD: Danice GoltzFenton, Clint R, PA   No chief complaint on file. ***  History of Present Illness:    Molly Harpsvelyn M Kirst is a 87 y.o. female with a hx of HTN, Mild Aortic regurgitation, Moderate Mitral regurgitation, HLD, permanent AF CHADSVASC of 4 and IDA who presents for evaluation.  Patient notes that (s)he is feeling ***.  Has had no chest pain, chest pressure, chest tightness, chest stinging ***.  Discomfort occurs with ***, worsens with ***, and improves with ***.  Patient exertion notable for *** with *** and feels no symptoms.  No shortness of breath, DOE ***.  No PND or orthopnea***.  No bendopnea***, weight gain***, leg swelling ***, or abdominal swelling***.  No syncope or near syncope ***.  Notes *** no palpitations or funny heart beats.     Patient reports prior cardiac testing including *** echo, *** stress test, *** heart catheterizations, *** cardioversion, *** ablations.  No history of ***pre-eclampsia or prematurity.  No Fen-Phen or drug use***.  Ambulatory BP ***.   Past Medical History:  Diagnosis Date  . Anxiety   . Aortic insufficiency   . Arthritis   . Chronic kidney disease (CKD)   . Depression   . Diverticulosis   . GERD (gastroesophageal reflux disease)   . Hypercholesterolemia   . Hypertension   . Iron deficiency anemia   . Mitral regurgitation     No past surgical history on file.  Current Medications: No outpatient medications have been marked as taking for the 05/23/20 encounter (Appointment) with Christell Constanthandrasekhar, Mahesh A, MD.     Allergies:   Aspirin   Social History   Socioeconomic History  . Marital status: Widowed    Spouse name: Not on file  . Number of children: 2  . Years of education: 3412  .  Highest education level: High school graduate  Occupational History  . Occupation: Retired  Tobacco Use  . Smoking status: Former Smoker    Packs/day: 0.25    Years: 10.00    Pack years: 2.50    Types: Cigarettes  . Smokeless tobacco: Never Used  . Tobacco comment: She smoked off and on  Vaping Use  . Vaping Use: Not on file  Substance and Sexual Activity  . Alcohol use: No  . Drug use: No  . Sexual activity: Not Currently  Other Topics Concern  . Not on file  Social History Narrative  . Not on file   Social Determinants of Health   Financial Resource Strain: Low Risk   . Difficulty of Paying Living Expenses: Not very hard  Food Insecurity: No Food Insecurity  . Worried About Programme researcher, broadcasting/film/videounning Out of Food in the Last Year: Never true  . Ran Out of Food in the Last Year: Never true  Transportation Needs: No Transportation Needs  . Lack of Transportation (Medical): No  . Lack of Transportation (Non-Medical): No  Physical Activity: Inactive  . Days of Exercise per Week: 0 days  . Minutes of Exercise per Session: 0 min  Stress: No Stress Concern Present  . Feeling of Stress : Only a little  Social Connections: Moderately Integrated  . Frequency of Communication with Friends and Family: More than three times a week  . Frequency  of Social Gatherings with Friends and Family: Once a week  . Attends Religious Services: More than 4 times per year  . Active Member of Clubs or Organizations: Yes  . Attends Banker Meetings: More than 4 times per year  . Marital Status: Widowed     Family History: The patient's ***family history is not on file.  ROS:   Please see the history of present illness.    *** All other systems reviewed and are negative.  EKGs/Labs/Other Studies Reviewed:    The following studies were reviewed today: ***  EKG:   04/11/20:   Recent Labs: No results found for requested labs within last 8760 hours.  Recent Lipid Panel No results found for:  CHOL, TRIG, HDL, CHOLHDL, VLDL, LDLCALC, LDLDIRECT   Risk Assessment/Calculations:   {Does this patient have ATRIAL FIBRILLATION?[phone]}   Physical Exam:    VS:  There were no vitals taken for this visit.    Wt Readings from Last 3 Encounters:  04/11/20 115 lb 6.4 oz (52.3 kg)  03/21/20 114 lb 6.4 oz (51.9 kg)  02/29/20 114 lb 3.2 oz (51.8 kg)     GEN: *** Well nourished, well developed in no acute distress HEENT: Normal NECK: No JVD; No carotid bruits LYMPHATICS: No lymphadenopathy CARDIAC: ***RRR, no murmurs, rubs, gallops RESPIRATORY:  Clear to auscultation without rales, wheezing or rhonchi  ABDOMEN: Soft, non-tender, non-distended MUSCULOSKELETAL:  No edema; No deformity  SKIN: Warm and dry NEUROLOGIC:  Alert and oriented x 3 PSYCHIATRIC:  Normal affect   ASSESSMENT:    No diagnosis found. PLAN:    In order of problems listed above:  Persistent Atrial Fibrillation, Vs Permanent *** - Risk factors include *** - CHADSVASC=***. - TSH ***, imaging notable for *** left atrial dilation - discussed alcohol and exercise as preventive factors - OSA Risk ***, Epworth Sleepiness Scale great than *** - Will get obtain TTE.   - Continue anticoagulation with ***.  - Continue rate control with *** - Rhythm control options *** - Considerations for DCCV *** - Consideration for ablation ***  Moderate Mitral Regurgitation, Mild Aortic Insuffieincy - NYHA class ***, Stage ***, ***volemic, etiology from *** - Diuretic regimen: Lasix/torsemide/Bumex +/- metolazone *** - Strict I/Os, daily weights, and fluid restriction of < 2 L  - Replace electrolytes PRN and keep K>4 and Mg>2. - *** BMP/BNP/Mg  - Coreg/bisoprolol/metoprolol***  - ARNI/ARB/ACEi*** - aldactone*** - isordil/hydralazine for afterload reduction***  - TTE ordered *** - SGLT2i*** - Device Indications: *** - Clinical Trials: *** - Indication for AHF: ***   *** Essential Hypertension, Diabetes with  hypertension, Obesity/DM/HTN - ambulatory blood pressure ***, will start/continue ambulatory BP monitoring; gave education on how to perform ambulatory blood pressure monitoring including the frequency and technique; goal ambulatory blood pressure < 135/85 on average - continue home medications with the exception of *** - will get labs in 7-10 days (BMET, Mg***) - OSA Risk ***, Epworth Sleepiness Scale great than *** - concern for secondary HTN, will order renal artery duplex, and Aldo/renin***  - Arm/Leg BP Differential < 20 mm Hg; not suggestive of coaracation; Arm BP differential < 15 mm Hg not suggestive of subclavian stenosis - discussed diet (DASH/low sodium), and exercise/weight loss interventions ***    {Are you ordering a CV Procedure (e.g. stress test, cath, DCCV, TEE, etc)?   Press F2        :[id number]}    Medication Adjustments/Labs and Tests Ordered: Current medicines are reviewed at length  with the patient today.  Concerns regarding medicines are outlined above.  No orders of the defined types were placed in this encounter.  No orders of the defined types were placed in this encounter.   There are no Patient Instructions on file for this visit.   Signed, Werner Lean, MD  05/23/2020 9:33 AM    [place]

## 2020-05-25 ENCOUNTER — Ambulatory Visit: Payer: Self-pay | Admitting: *Deleted

## 2020-05-25 ENCOUNTER — Other Ambulatory Visit: Payer: Self-pay | Admitting: *Deleted

## 2020-05-25 NOTE — Patient Outreach (Signed)
[facility]) Care Management  05/25/2020  Molly Neal February 23, 1932 [id number]  Unsuccessful outreach attempt made to patient. RN Health Coach left HIPAA compliant voicemail message along with her contact information.  Plan: RN Health Coach will call patient within the month of February.  [name] RN, BSN [facility] Care Management  RN Health Coach [phone] [name]

## 2020-05-26 ENCOUNTER — Other Ambulatory Visit: Payer: Self-pay | Admitting: *Deleted

## 2020-05-26 NOTE — Patient Outreach (Signed)
[facility]) Care Management  05/26/2020  ASHTON SABINE Aug 21, 1931 [id number]   CSW attempted a 2nd outreach call to pt's family member assisting with PCS paperwork and was unable to reach.  CSW will mail pt an Unsuccessful Outreach Letter and attempt again per policy.   Eduard Clos, MSW, [place] Worker  [place] [phone]

## 2020-05-30 DIAGNOSIS — R21 Rash and other nonspecific skin eruption: Secondary | ICD-10-CM | POA: Diagnosis not present

## 2020-06-01 ENCOUNTER — Ambulatory Visit: Payer: Medicare Other | Admitting: Cardiology

## 2020-06-02 ENCOUNTER — Other Ambulatory Visit: Payer: Self-pay | Admitting: *Deleted

## 2020-06-05 NOTE — Patient Outreach (Signed)
[facility] [name]) Care Management  06/05/2020  Molly Neal 24-Sep-1931 [id number]   CSW spoke briefly with pt's grandson who was in a meeting and requested to call back.  CSW awaits callback.  CSW ill outreach again if no word is received by 06/07/2020.   Eduard Clos, MSW, [place] Worker  [place] [phone]

## 2020-06-07 ENCOUNTER — Other Ambulatory Visit: Payer: Self-pay | Admitting: *Deleted

## 2020-06-07 NOTE — Patient Outreach (Addendum)
[facility]) Care Management  06/07/2020  TESSI EUSTACHE 1931-11-06 [id number]   CSW spoke with pt's grandson, Dewaine Oats, who reports he is awaiting word from PCP office about the [place] paperwork status.   CSW will also inquire for update and reconnect with [place].   Eduard Clos, MSW, [place] Worker  [place] [phone]

## 2020-06-14 ENCOUNTER — Encounter: Payer: Self-pay | Admitting: General Practice

## 2020-06-14 ENCOUNTER — Other Ambulatory Visit: Payer: Self-pay | Admitting: *Deleted

## 2020-06-14 NOTE — Patient Outreach (Deleted)
[facility] Care Management   

## 2020-06-14 NOTE — Patient Outreach (Signed)
[place] [facility]) Care Management  06/14/2020  SHERALEE QAZI [birth year] [id number]   CSW spoke to pt's family/[place] today who confirms they have not been successful with completion of the paperwork as of yet.  CSW has messaged PCP and will call the PCP office as well.   CSW advised Helyn App of plans to update him once updates are received.  Eduard Clos, MSW, [place] Worker  [place] [phone]

## 2020-06-15 DIAGNOSIS — D509 Iron deficiency anemia, unspecified: Secondary | ICD-10-CM | POA: Diagnosis not present

## 2020-06-15 DIAGNOSIS — F325 Major depressive disorder, single episode, in full remission: Secondary | ICD-10-CM | POA: Diagnosis not present

## 2020-06-15 DIAGNOSIS — M810 Age-related osteoporosis without current pathological fracture: Secondary | ICD-10-CM | POA: Diagnosis not present

## 2020-06-15 DIAGNOSIS — I10 Essential (primary) hypertension: Secondary | ICD-10-CM | POA: Diagnosis not present

## 2020-06-15 DIAGNOSIS — K219 Gastro-esophageal reflux disease without esophagitis: Secondary | ICD-10-CM | POA: Diagnosis not present

## 2020-06-15 DIAGNOSIS — E7800 Pure hypercholesterolemia, unspecified: Secondary | ICD-10-CM | POA: Diagnosis not present

## 2020-06-15 DIAGNOSIS — I480 Paroxysmal atrial fibrillation: Secondary | ICD-10-CM | POA: Diagnosis not present

## 2020-06-15 DIAGNOSIS — I129 Hypertensive chronic kidney disease with stage 1 through stage 4 chronic kidney disease, or unspecified chronic kidney disease: Secondary | ICD-10-CM | POA: Diagnosis not present

## 2020-06-16 ENCOUNTER — Ambulatory Visit: Payer: Self-pay | Admitting: *Deleted

## 2020-06-19 ENCOUNTER — Other Ambulatory Visit: Payer: Self-pay | Admitting: *Deleted

## 2020-06-19 ENCOUNTER — Ambulatory Visit: Payer: Self-pay | Admitting: *Deleted

## 2020-06-19 NOTE — Patient Outreach (Signed)
[facility]) Care Management  06/19/2020  Molly Neal 10-08-1931 [id number]   CSW spoke with [name] at [place] [facility] Lip[facility] office who indicates they do not use the Epic communication options as we do thus the messages were not received.  CSW explained to [name] what we needed done to aide in pt getting PCS services and CSW will email paperwork to her for PCP completion/signature and faxing to [place].  CSW updated pt's grandson, Molly Neal, who is also assisting.   CSW will sign off at this time.  CSW has assured PCP office and pt/family of support and assistance available if needs arise going forward.   Eduard Clos, MSW, [place] Worker  [place] [phone]

## 2020-06-21 DIAGNOSIS — E7800 Pure hypercholesterolemia, unspecified: Secondary | ICD-10-CM | POA: Diagnosis not present

## 2020-06-21 DIAGNOSIS — I129 Hypertensive chronic kidney disease with stage 1 through stage 4 chronic kidney disease, or unspecified chronic kidney disease: Secondary | ICD-10-CM | POA: Diagnosis not present

## 2020-06-21 DIAGNOSIS — F325 Major depressive disorder, single episode, in full remission: Secondary | ICD-10-CM | POA: Diagnosis not present

## 2020-06-21 DIAGNOSIS — I480 Paroxysmal atrial fibrillation: Secondary | ICD-10-CM | POA: Diagnosis not present

## 2020-06-21 DIAGNOSIS — N182 Chronic kidney disease, stage 2 (mild): Secondary | ICD-10-CM | POA: Diagnosis not present

## 2020-06-21 DIAGNOSIS — K219 Gastro-esophageal reflux disease without esophagitis: Secondary | ICD-10-CM | POA: Diagnosis not present

## 2020-06-21 DIAGNOSIS — I10 Essential (primary) hypertension: Secondary | ICD-10-CM | POA: Diagnosis not present

## 2020-06-21 DIAGNOSIS — M810 Age-related osteoporosis without current pathological fracture: Secondary | ICD-10-CM | POA: Diagnosis not present

## 2020-06-21 DIAGNOSIS — D509 Iron deficiency anemia, unspecified: Secondary | ICD-10-CM | POA: Diagnosis not present

## 2020-07-06 ENCOUNTER — Other Ambulatory Visit: Payer: Self-pay | Admitting: *Deleted

## 2020-07-06 NOTE — Patient Outreach (Signed)
[facility]) Care Management  [place]  07/06/2020   Molly Neal 1931/12/22 [id number]  Subjective: Successful telephone outreach call to patient. HIPAA identifiers obtained. Patient states she has been suffering with itching from a rash over her back. She explained that her PCP prescribed a course of prednisone which improved the rash but not fully. Patient adds that she called her PCP's several days ago to inform them that she was still had itching and was told they would send a referral to Dermatology. Patient states she would like to have something else prescribed to help with the itch and rash and would like a referral to podiatry to have her toenails trimmed. Nurse placed a call to PCP's office and left a VM with [name] the provider's assistant regarding the above issues. CSW continues to work with the patient's grandson on [place] application completion. Patient states that she has not had any recent falls and she is receiving Meals on Wheels which has made a huge difference. She continues to supplement with Ensure and Boost. Nurse will send patient Ensure coupons. Patient explains that she is not taking her B/P and Pulse and recording the values due to her B/P monitor is broken. Nurse provided education and will send patient a B/P monitor and a calendar booklet to record the values. Patient did not have any further questions or concerns today and did confirm that the patient has this nurse's contact number to call her if needed.   Encounter Medications:  Outpatient Encounter Medications as of 07/06/2020  Medication Sig Note  . amLODipine (NORVASC) 10 MG tablet Take 10 mg by mouth daily.   Marland Kitchen apixaban (ELIQUIS) 2.5 MG TABS tablet Take 1 tablet (2.5 mg total) by mouth 2 (two) times daily.   . Ascorbic Acid (VITAMIN C PO) Take 1,000 mg by mouth daily.   . busPIRone (BUSPAR) 5 MG tablet Take 5 mg by mouth 2 (two) times daily.   . cetirizine (ZYRTEC) 10 MG  tablet Take 10 mg by mouth every morning.   . Cholecalciferol (VITAMIN D3) 25 MCG (1000 UT) CAPS Take 1 capsule by mouth every morning.   Marland Kitchen FEROSUL 325 (65 Fe) MG tablet Take 325 mg by mouth daily.    Marland Kitchen HYDROcodone-acetaminophen (NORCO/VICODIN) 5-325 MG per tablet Take by mouth every 8 (eight) hours as needed. For 30 days   . LORazepam (ATIVAN) 0.5 MG tablet Take 0.5 mg by mouth 2 (two) times daily as needed.   . metoprolol succinate (TOPROL-XL) 50 MG 24 hr tablet Take 50 mg by mouth daily.   . NON FORMULARY SMARTSIG:1 Tablet(s) By Mouth Twice Daily   . sertraline (ZOLOFT) 50 MG tablet Take 50 mg by mouth daily.    . traMADol (ULTRAM) 50 MG tablet Take 1 tablet (50 mg total) by mouth every 6 (six) hours as needed.   Marland Kitchen [place] injection  (Patient not taking: Reported on 07/06/2020) 07/06/2020: Sent to pharmacy   No facility-administered encounter medications on file as of 07/06/2020.    Functional Status:  In your present state of health, do you have any difficulty performing the following activities: 07/29/2019 07/24/2019  Hearing? N N  Vision? N N  Difficulty concentrating or making decisions? N N  Walking or climbing stairs? Y Y  Comment due to pain in knees Pain in knees.  Dressing or bathing? N N  Doing errands, shopping? Y Y  Comment due to pain in knees Pain in knees.  Preparing Food and eating ?  Y Y  Comment due to pain in knees Pain in knees.  Using the Toilet? N N  In the past six months, have you accidently leaked urine? Y Y  Comment incontintent at times Occasional urinary incontinence.  Do you have problems with loss of bowel control? N N  Managing your Medications? N N  Managing your Finances? N N  Housekeeping or managing your Housekeeping? Y Y  Comment due to pain in knees Pain in knees.  Some recent data might be hidden    Fall/Depression Screening: Fall Risk  04/06/2020 04/06/2020 11/30/2019  Falls in the past year? 0 0 0  Number falls in past yr: 0 0 0  Injury  with Fall? 0 0 0  Risk Factor Category  - - -  Risk for fall due to : Impaired mobility;Impaired balance/gait Impaired balance/gait;Impaired mobility Impaired balance/gait;Impaired mobility  Follow up Education provided;Falls prevention discussed;Falls evaluation completed Falls prevention discussed;Education provided;Falls evaluation completed Falls prevention discussed;Education provided;Falls evaluation completed   PHQ 2/9 Scores 07/29/2019 07/24/2019 07/14/2019 09/21/2015 08/29/2015  PHQ - 2 Score 0 0 0 0 0    Assessment:  Goals Addressed            This Visit's Progress   . COMPLETED: Find Help in My Community        Follow Up Date 07/06/20   - call 44 when I need some help - follow-up on any referrals for help I am given - think ahead to make sure my need does not become an emergency - make a list of family or friends that I can call    Why is this important?   Knowing how and where to find help for yourself or family in your neighborhood and community is an important skill.  You will want to take some steps to learn how.    Notes: Nurse will place a CSW referral for patient assistance with receiving a home aide. Patient did state that Meals on Wheels should start sometime next week.  Updated 07/06/20: Patient reports she is receiving Meals on Wheels and the she is receiving assistance with applying for Medicaid to hopefully be able to qualify to receive home aide services.     . COMPLETED: Patient will verbalize taking her B/P daily and recording the value within the next 90 days.       CARE PLAN ENTRY (see longtitudinal plan of care for additional care plan information)  Objective:  . Last practice recorded BP readings:  BP Readings from Last 3 Encounters:  08/29/15 (!) 172/70  07/20/14 167/67  04/28/14 156/64 .   Marland Kitchen Most recent eGFR/CrCl: No results found for: EGFR  No components found for: CRCL  Current Barriers:  Marland Kitchen Knowledge deficit related to self care management of  hypertension . Limited Social Support  Case Manager Clinical Goal(s):  Marland Kitchen Over the next 90 days, patient will verbalize understanding of plan for hypertension management . Over the next 90 days, patient will not experience hospital admission. Hospital Admissions in last 6 months = 0 . Over the next 90 days, patient will demonstrate improved adherence to prescribed treatment plan for hypertension as evidenced by taking all medications as prescribed, monitoring and recording blood pressure as directed, adhering to low sodium/DASH diet  Interventions:  . Reviewed medications with patient and discussed importance of compliance . Advised patient, providing education and rationale, to monitor blood pressure daily and record, calling PCP for findings outside established parameters.  . Provided education regarding s/s  of stroke and stroke prevention . Provided education regarding s/s of DASH diet/Low salt diet . Provided education regarding complications of uncontrolled blood pressure . Provided educational material: Matter of Choice Blood Pressure Control  Patient Self Care Activities:  . Self administers medications as prescribed . Nurse provided education about the importance of taking her B/P, pulse, and recording the values daily. Patient stated she would begin to take her pulse and B/P daily and would bring those values to her providers appointments.     Please see past updates related to this goal by clicking on the "Past Updates" button in the selected goal Updated: 04/06/20  Resolved due to duplicate goals 11/29/43       . THN Patient will begin to take her B/P and pulse daily and record the values within 90 days.       Timeframe:  Long-Range Goal Priority:  High Start Date: 04/06/20                            Expected End Date: 04/18/21                      Follow Up Date 10/16/20    - check blood pressure daily - write blood pressure results in a log or diary   -Encouraged  patient to take pulse daily and record  Why is this important?   You won't feel high blood pressure, but it can still hurt your blood vessels.  High blood pressure can cause heart or kidney problems. It can also cause a stroke.  Making lifestyle changes like losing a little weight or eating less salt will help.  Checking your blood pressure at home and at different times of the day can help to control blood pressure.  If the doctor prescribes medicine remember to take it the way the doctor ordered.  Call the office if you cannot afford the medicine or if there are questions about it.     Notes: Nurse provided education and encouraged patient to take B/P and pulse daily. Will send hypertension education.  Updated 07/06/20: Patient states her B/P monitor is broken. Nurse continued education with patient and will send patient a calendar booklet and B/P monitor.    Molly Neal Patient will begin to take her pulse daily and record the values into a calendar booklet within 90 days       Timeframe:  Long-Range Goal Priority:  High Start Date:  04/06/20                           Expected End Date: 04/18/21                      Follow Up Date 10/16/20   - begin a symptom diary - bring symptom diary to all appointments - check pulse (heart) rate once a day - make a plan to eat healthy - keep all lab appointments - take medicine as prescribed    Why is this important?   Atrial fibrillation may have no symptoms. Sometimes the symptoms get worse or happen more often.  It is important to keep track of what your symptoms are and when they happen.  A change in symptoms is important to discuss with your doctor or nurse.  Being active and healthy eating will also help you manage your heart condition.     Notes:  Encouraged patient to begin to take her B/P and pulse daily and record the values. Nurse discussed for patient to bring the values to her provider appointments and to contact providers for high and  low values. Nurse will send patient atrial fibrillation education. Updated 07/06/20: Nurse continued education with patient. Nurse will send patient a calendar booklet to record values and a B/P monitor to replace hers that is broken.     Molly Neal Patient will verbalize arranging transportation through her agency 1 week before her provider appointments       Timeframe:  Long-Range Goal Priority:  High Start Date:  04/06/20                           Expected End Date: 04/18/21                      Follow Up Date 10/16/20   - arrange a ride through an agency 1 week before appointment - call to cancel if needed - keep a calendar with appointment dates    Why is this important?   Part of staying healthy is seeing the doctor for follow-up care.  If you forget your appointments, there are some things you can do to stay on track.    Notes: Nurse discussed contacting her transportation agency in plenty of time to ensure she has a ride to her medical appointments.     Molly Neal Patient will verbalize that she is asking her providers questions if she does not understand       Timeframe:  Long-Range Goal Priority:  High Start Date:  04/06/20                           Expected End Date: 04/18/21                      Follow Up Date 10/16/20   - make a list of questions - ask questions - repeat what I heard to make sure I understand - bring a list of my medicines to the visit - speak up when I don't understand    Why is this important?   The best way to learn about your health and care is by talking to the doctor and nurse.  They will answer your questions and give you information in the way that you like best.    Notes: Nurse will send patient hypertension and atrial fibrillation education.  Updated 07/06/20: Nurse encourage patient to contact her when she has questions regarding her health and wellness.      Plan: RN Health Coach will send PCP a quarterly update, will send patient Ensure  coupons, B/P monitor, calendar booklet, will call patient within the month of April. Follow-up:  Patient agrees to Care Plan and Follow-up.  [name] RN, BSN [place] [phone] [email or web address]

## 2020-07-06 NOTE — Patient Instructions (Signed)
Goals Addressed            This Visit's Progress   . COMPLETED: Find Help in My Community        Follow Up Date 07/06/20   - call 75 when I need some help - follow-up on any referrals for help I am given - think ahead to make sure my need does not become an emergency - make a list of family or friends that I can call    Why is this important?   Knowing how and where to find help for yourself or family in your neighborhood and community is an important skill.  You will want to take some steps to learn how.    Notes: Nurse will place a CSW referral for patient assistance with receiving a home aide. Patient did state that Meals on Wheels should start sometime next week.  Updated 07/06/20: Patient reports she is receiving Meals on Wheels and the she is receiving assistance with applying for Medicaid to hopefully be able to qualify to receive home aide services.     . COMPLETED: Patient will verbalize taking her B/P daily and recording the value within the next 90 days.       CARE PLAN ENTRY (see longtitudinal plan of care for additional care plan information)  Objective:  . Last practice recorded BP readings:  BP Readings from Last 3 Encounters:  08/29/15 (!) 172/70  07/20/14 167/67  04/28/14 156/64 .   Marland Kitchen Most recent eGFR/CrCl: No results found for: EGFR  No components found for: CRCL  Current Barriers:  Marland Kitchen Knowledge deficit related to self care management of hypertension . Limited Social Support  Case Manager Clinical Goal(s):  Marland Kitchen Over the next 90 days, patient will verbalize understanding of plan for hypertension management . Over the next 90 days, patient will not experience hospital admission. Hospital Admissions in last 6 months = 0 . Over the next 90 days, patient will demonstrate improved adherence to prescribed treatment plan for hypertension as evidenced by taking all medications as prescribed, monitoring and recording blood pressure as directed, adhering to low  sodium/DASH diet  Interventions:  . Reviewed medications with patient and discussed importance of compliance . Advised patient, providing education and rationale, to monitor blood pressure daily and record, calling PCP for findings outside established parameters.  . Provided education regarding s/s of stroke and stroke prevention . Provided education regarding s/s of DASH diet/Low salt diet . Provided education regarding complications of uncontrolled blood pressure . Provided educational material: Matter of Choice Blood Pressure Control  Patient Self Care Activities:  . Self administers medications as prescribed . Nurse provided education about the importance of taking her B/P, pulse, and recording the values daily. Patient stated she would begin to take her pulse and B/P daily and would bring those values to her providers appointments.     Please see past updates related to this goal by clicking on the "Past Updates" button in the selected goal Updated: 04/06/20  Resolved due to duplicate goals 11/12/92       . THN Patient will begin to take her B/P and pulse daily and record the values within 90 days.       Timeframe:  Long-Range Goal Priority:  High Start Date: 04/06/20                            Expected End Date: 04/18/21  Follow Up Date 10/16/20    - check blood pressure daily - write blood pressure results in a log or diary   -Encouraged patient to take pulse daily and record  Why is this important?   You won't feel high blood pressure, but it can still hurt your blood vessels.  High blood pressure can cause heart or kidney problems. It can also cause a stroke.  Making lifestyle changes like losing a little weight or eating less salt will help.  Checking your blood pressure at home and at different times of the day can help to control blood pressure.  If the doctor prescribes medicine remember to take it the way the doctor ordered.  Call the office if  you cannot afford the medicine or if there are questions about it.     Notes: Nurse provided education and encouraged patient to take B/P and pulse daily. Will send hypertension education.  Updated 07/06/20: Patient states her B/P monitor is broken. Nurse continued education with patient and will send patient a calendar booklet and B/P monitor.    Molly Neal Patient will begin to take her pulse daily and record the values into a calendar booklet within 90 days       Timeframe:  Long-Range Goal Priority:  High Start Date:  04/06/20                           Expected End Date: 04/18/21                      Follow Up Date 10/16/20   - begin a symptom diary - bring symptom diary to all appointments - check pulse (heart) rate once a day - make a plan to eat healthy - keep all lab appointments - take medicine as prescribed    Why is this important?   Atrial fibrillation may have no symptoms. Sometimes the symptoms get worse or happen more often.  It is important to keep track of what your symptoms are and when they happen.  A change in symptoms is important to discuss with your doctor or nurse.  Being active and healthy eating will also help you manage your heart condition.     Notes: Encouraged patient to begin to take her B/P and pulse daily and record the values. Nurse discussed for patient to bring the values to her provider appointments and to contact providers for high and low values. Nurse will send patient atrial fibrillation education. Updated 07/06/20: Nurse continued education with patient. Nurse will send patient a calendar booklet to record values and a B/P monitor to replace hers that is broken.     Molly Neal Patient will verbalize arranging transportation through her agency 1 week before her provider appointments       Timeframe:  Long-Range Goal Priority:  High Start Date:  04/06/20                           Expected End Date: 04/18/21                      Follow Up Date 10/16/20    - arrange a ride through an agency 1 week before appointment - call to cancel if needed - keep a calendar with appointment dates    Why is this important?   Part of staying healthy is seeing the doctor for follow-up care.  If you forget your appointments, there are some things you can do to stay on track.    Notes: Nurse discussed contacting her transportation agency in plenty of time to ensure she has a ride to her medical appointments.     Molly Neal Patient will verbalize that she is asking her providers questions if she does not understand       Timeframe:  Long-Range Goal Priority:  High Start Date:  04/06/20                           Expected End Date: 04/18/21                      Follow Up Date 10/16/20   - make a list of questions - ask questions - repeat what I heard to make sure I understand - bring a list of my medicines to the visit - speak up when I don't understand    Why is this important?   The best way to learn about your health and care is by talking to the doctor and nurse.  They will answer your questions and give you information in the way that you like best.    Notes: Nurse will send patient hypertension and atrial fibrillation education.  Updated 07/06/20: Nurse encourage patient to contact her when she has questions regarding her health and wellness.

## 2020-07-07 DIAGNOSIS — L309 Dermatitis, unspecified: Secondary | ICD-10-CM | POA: Diagnosis not present

## 2020-07-13 DIAGNOSIS — M170 Bilateral primary osteoarthritis of knee: Secondary | ICD-10-CM | POA: Diagnosis not present

## 2020-07-13 DIAGNOSIS — M25511 Pain in right shoulder: Secondary | ICD-10-CM | POA: Diagnosis not present

## 2020-07-19 NOTE — Progress Notes (Signed)
Cardiology Office Note:    Date:  07/21/2020   ID:  Molly Neal, DOB 1931/09/25, MRN [id number]  PCP:  Lajean Manes, [place]  Cardiologist:  No primary care provider on file.  Advanced Practice Provider:  No care team member to display Electrophysiologist:  None   Referring MD: Lajean Manes, MD     History of Present Illness:    Molly Neal is a 87 y.o. female with a hx of HTN, mild AI, moderate MR, HLD, CKD, depression, and atrial fibrillation who was referred by Dr. Felipa Eth for further management of her atrial fibrillation.  The patient was initially diagnosed with atrial fibrillation 02/22/20 at her PCP office incidentally during a routine visit. She was started on Eliquis for a CHADS2VASC score of 4 and her metoprolol was increased. Echo showed preserved EF 60-65%, mod MR, mild AI. Has been followed by Malka So in Afib clinic.  The patient states she overall feels well. Has a lot of orthopedic issues which is her main issue. No chest pain, SOB, lightheadedness, dizziness, or SOB. Occasional palpitation but not bothersome. No bleeding issues on the apixaban. Has not been monitoring blood pressures at home but is going to get help from [facility] care management to do so. Blood pressure elevated today.  Past Medical History:  Diagnosis Date  . Anxiety   . Aortic insufficiency   . Arthritis   . Chronic kidney disease (CKD)   . Depression   . Diverticulosis   . GERD (gastroesophageal reflux disease)   . Hypercholesterolemia   . Hypertension   . Iron deficiency anemia   . Mitral regurgitation     History reviewed. No pertinent surgical history.  Current Medications: Current Meds  Medication Sig  . amLODipine (NORVASC) 5 MG tablet Take 5 mg by mouth at bedtime.  Marland Kitchen apixaban (ELIQUIS) 2.5 MG TABS tablet Take 1 tablet (2.5 mg total) by mouth 2 (two) times daily.  . Ascorbic Acid (VITAMIN C PO) Take 1,000 mg by mouth daily.  .  busPIRone (BUSPAR) 5 MG tablet Take 5 mg by mouth 2 (two) times daily.  . cetirizine (ZYRTEC) 10 MG tablet Take 10 mg by mouth every morning.  . Cholecalciferol (VITAMIN D3) 25 MCG (1000 UT) CAPS Take 1 capsule by mouth every morning.  Marland Kitchen FEROSUL 325 (65 Fe) MG tablet Take 325 mg by mouth daily.   Marland Kitchen HYDROcodone-acetaminophen (NORCO/VICODIN) 5-325 MG per tablet Take by mouth every 8 (eight) hours as needed. For 30 days  . LORazepam (ATIVAN) 0.5 MG tablet Take 0.5 mg by mouth 2 (two) times daily as needed.  . metoprolol succinate (TOPROL-XL) 50 MG 24 hr tablet Take 50 mg by mouth daily.  . NON FORMULARY SMARTSIG:1 Tablet(s) By Mouth Twice Daily  . sertraline (ZOLOFT) 50 MG tablet Take 50 mg by mouth daily.   Marland Kitchen SHINGRIX injection   . traMADol (ULTRAM) 50 MG tablet Take 1 tablet (50 mg total) by mouth every 6 (six) hours as needed.  . triamcinolone (KENALOG) 0.1 % SMARTSIG:Sparingly Topical Twice Daily  . [DISCONTINUED] amLODipine (NORVASC) 10 MG tablet Take 10 mg by mouth daily.     Allergies:   Aspirin and Pravastatin   Social History   Socioeconomic History  . Marital status: Widowed    Spouse name: Not on file  . Number of children: 2  . Years of education: 60  . Highest education level: High school graduate  Occupational History  . Occupation: Retired  Tobacco Use  . Smoking status: Former Smoker    Packs/day: 0.25    Years: 10.00    Pack years: 2.50    Types: Cigarettes  . Smokeless tobacco: Never Used  . Tobacco comment: She smoked off and on  Vaping Use  . Vaping Use: Not on file  Substance and Sexual Activity  . Alcohol use: No  . Drug use: No  . Sexual activity: Not Currently  Other Topics Concern  . Not on file  Social History Narrative  . Not on file   Social Determinants of Health   Financial Resource Strain: Low Risk   . Difficulty of Paying Living Expenses: Not very hard  Food Insecurity: No Food Insecurity  . Worried About Charity fundraiser in the Last  Year: Never true  . Ran Out of Food in the Last Year: Never true  Transportation Needs: No Transportation Needs  . Lack of Transportation (Medical): No  . Lack of Transportation (Non-Medical): No  Physical Activity: Inactive  . Days of Exercise per Week: 0 days  . Minutes of Exercise per Session: 0 min  Stress: No Stress Concern Present  . Feeling of Stress : Only a little  Social Connections: Moderately Integrated  . Frequency of Communication with Friends and Family: More than three times a week  . Frequency of Social Gatherings with Friends and Family: Once a week  . Attends Religious Services: More than 4 times per year  . Active Member of Clubs or Organizations: Yes  . Attends Archivist Meetings: More than 4 times per year  . Marital Status: Widowed     Family History: The patient's family history is not on file.  ROS:   Please see the history of present illness.    Review of Systems  Constitutional: Negative for chills and fever.  HENT: Negative for sore throat.   Eyes: Negative for blurred vision and redness.  Respiratory: Negative for shortness of breath.   Cardiovascular: Positive for palpitations. Negative for chest pain, orthopnea, claudication, leg swelling and PND.  Gastrointestinal: Negative for blood in stool, melena, nausea and vomiting.  Genitourinary: Negative for hematuria.  Musculoskeletal: Positive for joint pain and myalgias.  Neurological: Negative for dizziness and loss of consciousness.  Endo/Heme/Allergies: Negative for polydipsia.  Psychiatric/Behavioral: Negative for substance abuse.    EKGs/Labs/Other Studies Reviewed:    The following studies were reviewed today: TTE Apr 04, 2020: 1. Left ventricular ejection fraction, by estimation, is 60 to 65%. The  left ventricle has normal function. The left ventricle has no regional  wall motion abnormalities. Left ventricular diastolic function could not  be evaluated.  2. Right ventricular  systolic function is normal. The right ventricular  size is normal. There is moderately elevated pulmonary artery systolic  pressure.  3. Left atrial size was mildly dilated.  4. The mitral valve is normal in structure. Moderate mitral valve  regurgitation.  5. Tricuspid valve regurgitation is moderate.  6. The aortic valve is normal in structure. Aortic valve regurgitation is  mild. No aortic stenosis is present.  EKG:  EKG is  ordered today.  The ekg ordered today demonstrates atrial fibrillation with HR 87  Recent Labs: No results found for requested labs within last 8760 hours.  Recent Lipid Panel No results found for: CHOL, TRIG, HDL, CHOLHDL, VLDL, LDLCALC, LDLDIRECT   Risk Assessment/Calculations:    CHA2DS2-VASc Score = 4  This indicates a 4.8% annual risk of stroke. The patient's score is based upon: CHF  History: No HTN History: Yes Diabetes History: No Stroke History: No Vascular Disease History: No Age Score: 2 Gender Score: 1    Physical Exam:    VS:  BP (!) 150/90   Pulse 87   Ht 5\' 2"  (1.575 m)   Wt 112 lb 9.6 oz (51.1 kg)   SpO2 98%   BMI 20.59 kg/m     Wt Readings from Last 3 Encounters:  07/21/20 112 lb 9.6 oz (51.1 kg)  04/11/20 115 lb 6.4 oz (52.3 kg)  03/21/20 114 lb 6.4 oz (51.9 kg)     GEN:  Well nourished, well developed in no acute distress HEENT: Normal NECK: No JVD; No carotid bruits CARDIAC: Irregularly irregular, 2/6 systolic murmur best heard at RUSB. No rubs, gallops RESPIRATORY:  Clear to auscultation without rales, wheezing or rhonchi  ABDOMEN: Soft, non-tender, non-distended MUSCULOSKELETAL:  No edema; No deformity  SKIN: Warm and dry NEUROLOGIC:  Alert and oriented x 3 PSYCHIATRIC:  Normal affect   ASSESSMENT:    1. Permanent atrial fibrillation ([place])   2. Secondary hypercoagulable state ([place])   3. Primary hypertension   4. Moderate mitral regurgitation    PLAN:    In order of problems listed  above:  #Permanent Afib: CHADs-vasc 4. Declined further interventions to try to restore sinus rhythm. Minimal symptoms and rate controlled. TTE with normal EF, no WMA. Tolerating apixaban without issues -Continue apixaban 2.5mg  BID -Continue metop 50mg  daily  #Mild AI #Moderate MR: Stable from TTE in 2018. -Serial TTEs for monitoring--next in 2024  #HTN: Elevated in the office. Not monitoring at home. -Will send new blood pressure cuff -Continue amlodipine 5mg  daily -Continue metop 50mg  XL -Patient will keep log and call us with the results    Medication Adjustments/Labs and Tests Ordered: Current medicines are reviewed at length with the patient today.  Concerns regarding medicines are outlined above.  Orders Placed This Encounter  Procedures  . EKG 12-Lead   No orders of the defined types were placed in this encounter.   Patient Instructions  Medication Instructions:  Your physician recommends that you continue on your current medications as directed. Please refer to the Current Medication list given to you today.  *If you need a refill on your cardiac medications before your next appointment, please call your pharmacy*   Lab Work: None ordered  If you have labs (blood work) drawn today and your tests are completely normal, you will receive your results only by: Marland Kitchen MyChart Message (if you have MyChart) OR . A paper copy in the mail If you have any lab test that is abnormal or we need to change your treatment, we will call you to review the results.   Testing/Procedures: None ordered   Follow-Up: At [facility], you and your health needs are our priority.  As part of our continuing mission to provide you with exceptional heart care, we have created designated Provider Care Teams.  These Care Teams include your primary Cardiologist (physician) and Advanced Practice Providers (APPs -  Physician Assistants and Nurse Practitioners) who all work together to provide  you with the care you need, when you need it.  We recommend signing up for the patient portal called "MyChart".  Sign up information is provided on this After Visit Summary.  MyChart is used to connect with patients for Virtual Visits (Telemedicine).  Patients are able to view lab/test results, encounter notes, upcoming appointments, etc.  Non-urgent messages can be sent to your provider as well.  To learn more about what you can do with MyChart, go to [email or web address].    Your next appointment:   6 month(s)  The format for your next appointment:   In Person  Provider:   Gwyndolyn Kaufman, MD   Other Instructions Monitor your blood pressure at home. Call the office if your pressures are still running so we can make some medication changes.      Signed, Freada Bergeron, MD  07/21/2020 2:20 PM    [facility]

## 2020-07-21 ENCOUNTER — Encounter: Payer: Self-pay | Admitting: Cardiology

## 2020-07-21 ENCOUNTER — Ambulatory Visit (INDEPENDENT_AMBULATORY_CARE_PROVIDER_SITE_OTHER): Payer: Medicare Other | Admitting: Cardiology

## 2020-07-21 ENCOUNTER — Other Ambulatory Visit: Payer: Self-pay

## 2020-07-21 VITALS — BP 150/90 | HR 87 | Ht 62.0 in | Wt 112.6 lb

## 2020-07-21 DIAGNOSIS — I340 Nonrheumatic mitral (valve) insufficiency: Secondary | ICD-10-CM

## 2020-07-21 DIAGNOSIS — I10 Essential (primary) hypertension: Secondary | ICD-10-CM | POA: Diagnosis not present

## 2020-07-21 DIAGNOSIS — I4821 Permanent atrial fibrillation: Secondary | ICD-10-CM | POA: Diagnosis not present

## 2020-07-21 DIAGNOSIS — D6869 Other thrombophilia: Secondary | ICD-10-CM | POA: Diagnosis not present

## 2020-07-21 NOTE — Patient Instructions (Addendum)
Medication Instructions:  Your physician recommends that you continue on your current medications as directed. Please refer to the Current Medication list given to you today.  *If you need a refill on your cardiac medications before your next appointment, please call your pharmacy*   Lab Work: None ordered  If you have labs (blood work) drawn today and your tests are completely normal, you will receive your results only by: Marland Kitchen MyChart Message (if you have MyChart) OR . A paper copy in the mail If you have any lab test that is abnormal or we need to change your treatment, we will call you to review the results.   Testing/Procedures: None ordered   Follow-Up: At [facility], you and your health needs are our priority.  As part of our continuing mission to provide you with exceptional heart care, we have created designated Provider Care Teams.  These Care Teams include your primary Cardiologist (physician) and Advanced Practice Providers (APPs -  Physician Assistants and Nurse Practitioners) who all work together to provide you with the care you need, when you need it.  We recommend signing up for the patient portal called "MyChart".  Sign up information is provided on this After Visit Summary.  MyChart is used to connect with patients for Virtual Visits (Telemedicine).  Patients are able to view lab/test results, encounter notes, upcoming appointments, etc.  Non-urgent messages can be sent to your provider as well.   To learn more about what you can do with MyChart, go to [email or web address].    Your next appointment:   6 month(s)  The format for your next appointment:   In Person  Provider:   Gwyndolyn Kaufman, MD   Other Instructions Monitor your blood pressure at home. Call the office if your pressures are still running so we can make some medication changes.

## 2020-07-26 DIAGNOSIS — R269 Unspecified abnormalities of gait and mobility: Secondary | ICD-10-CM | POA: Diagnosis not present

## 2020-07-26 DIAGNOSIS — R21 Rash and other nonspecific skin eruption: Secondary | ICD-10-CM | POA: Diagnosis not present

## 2020-07-26 DIAGNOSIS — D6869 Other thrombophilia: Secondary | ICD-10-CM | POA: Diagnosis not present

## 2020-07-26 DIAGNOSIS — M170 Bilateral primary osteoarthritis of knee: Secondary | ICD-10-CM | POA: Diagnosis not present

## 2020-07-26 DIAGNOSIS — I480 Paroxysmal atrial fibrillation: Secondary | ICD-10-CM | POA: Diagnosis not present

## 2020-07-26 DIAGNOSIS — I10 Essential (primary) hypertension: Secondary | ICD-10-CM | POA: Diagnosis not present

## 2020-07-26 DIAGNOSIS — I351 Nonrheumatic aortic (valve) insufficiency: Secondary | ICD-10-CM | POA: Diagnosis not present

## 2020-07-28 ENCOUNTER — Ambulatory Visit (INDEPENDENT_AMBULATORY_CARE_PROVIDER_SITE_OTHER): Payer: Medicare Other | Admitting: Podiatry

## 2020-07-28 ENCOUNTER — Other Ambulatory Visit: Payer: Self-pay

## 2020-07-28 ENCOUNTER — Encounter: Payer: Self-pay | Admitting: Podiatry

## 2020-07-28 DIAGNOSIS — B351 Tinea unguium: Secondary | ICD-10-CM | POA: Diagnosis not present

## 2020-07-28 DIAGNOSIS — M79674 Pain in right toe(s): Secondary | ICD-10-CM

## 2020-07-28 DIAGNOSIS — M79675 Pain in left toe(s): Secondary | ICD-10-CM

## 2020-08-01 ENCOUNTER — Emergency Department (HOSPITAL_COMMUNITY)
Admission: EM | Admit: 2020-08-01 | Discharge: 2020-08-01 | Disposition: A | Discharge status: Home / Self Care | Payer: Medicare Other | Attending: Emergency Medicine | Admitting: Emergency Medicine

## 2020-08-01 ENCOUNTER — Emergency Department (HOSPITAL_COMMUNITY): Payer: Medicare Other

## 2020-08-01 ENCOUNTER — Encounter: Payer: Self-pay | Admitting: Podiatry

## 2020-08-01 DIAGNOSIS — Z7901 Long term (current) use of anticoagulants: Secondary | ICD-10-CM | POA: Diagnosis not present

## 2020-08-01 DIAGNOSIS — I4821 Permanent atrial fibrillation: Secondary | ICD-10-CM | POA: Insufficient documentation

## 2020-08-01 DIAGNOSIS — S0990XA Unspecified injury of head, initial encounter: Secondary | ICD-10-CM | POA: Diagnosis present

## 2020-08-01 DIAGNOSIS — S46911A Strain of unspecified muscle, fascia and tendon at shoulder and upper arm level, right arm, initial encounter: Secondary | ICD-10-CM

## 2020-08-01 DIAGNOSIS — Z87891 Personal history of nicotine dependence: Secondary | ICD-10-CM | POA: Diagnosis not present

## 2020-08-01 DIAGNOSIS — W19XXXA Unspecified fall, initial encounter: Secondary | ICD-10-CM

## 2020-08-01 DIAGNOSIS — S0003XA Contusion of scalp, initial encounter: Secondary | ICD-10-CM

## 2020-08-01 DIAGNOSIS — I129 Hypertensive chronic kidney disease with stage 1 through stage 4 chronic kidney disease, or unspecified chronic kidney disease: Secondary | ICD-10-CM | POA: Insufficient documentation

## 2020-08-01 DIAGNOSIS — S4991XA Unspecified injury of right shoulder and upper arm, initial encounter: Secondary | ICD-10-CM | POA: Diagnosis not present

## 2020-08-01 DIAGNOSIS — Z79899 Other long term (current) drug therapy: Secondary | ICD-10-CM | POA: Diagnosis not present

## 2020-08-01 DIAGNOSIS — Z20822 Contact with and (suspected) exposure to covid-19: Secondary | ICD-10-CM | POA: Insufficient documentation

## 2020-08-01 DIAGNOSIS — M25511 Pain in right shoulder: Secondary | ICD-10-CM | POA: Insufficient documentation

## 2020-08-01 DIAGNOSIS — W01198A Fall on same level from slipping, tripping and stumbling with subsequent striking against other object, initial encounter: Secondary | ICD-10-CM | POA: Diagnosis not present

## 2020-08-01 DIAGNOSIS — I517 Cardiomegaly: Secondary | ICD-10-CM | POA: Diagnosis not present

## 2020-08-01 DIAGNOSIS — I4891 Unspecified atrial fibrillation: Secondary | ICD-10-CM | POA: Diagnosis not present

## 2020-08-01 DIAGNOSIS — S3993XA Unspecified injury of pelvis, initial encounter: Secondary | ICD-10-CM | POA: Diagnosis not present

## 2020-08-01 DIAGNOSIS — Z043 Encounter for examination and observation following other accident: Secondary | ICD-10-CM | POA: Diagnosis not present

## 2020-08-01 DIAGNOSIS — N189 Chronic kidney disease, unspecified: Secondary | ICD-10-CM | POA: Diagnosis not present

## 2020-08-01 LAB — COMPREHENSIVE METABOLIC PANEL
ALT: 18 U/L (ref 0–44)
AST: 41 U/L (ref 15–41)
Albumin: 3.6 g/dL (ref 3.5–5.0)
Alkaline Phosphatase: 83 U/L (ref 38–126)
Anion gap: 7 (ref 5–15)
BUN: 33 mg/dL — ABNORMAL HIGH (ref 8–23)
CO2: 24 mmol/L (ref 22–32)
Calcium: 9.5 mg/dL (ref 8.9–10.3)
Chloride: 105 mmol/L (ref 98–111)
Creatinine, Ser: 1.07 mg/dL — ABNORMAL HIGH (ref 0.44–1.00)
GFR, Estimated: 51 mL/min — ABNORMAL LOW (ref >60)
Glucose, Bld: 114 mg/dL — ABNORMAL HIGH (ref 70–99)
Potassium: 4.3 mmol/L (ref 3.5–5.1)
Sodium: 136 mmol/L (ref 135–145)
Total Bilirubin: 0.9 mg/dL (ref 0.3–1.2)
Total Protein: 7 g/dL (ref 6.5–8.1)

## 2020-08-01 LAB — CBC
HCT: 36.7 % (ref 36.0–46.0)
Hemoglobin: 11.5 g/dL — ABNORMAL LOW (ref 12.0–15.0)
MCH: 27.2 pg (ref 26.0–34.0)
MCHC: 31.3 g/dL (ref 30.0–36.0)
MCV: 86.8 fL (ref 80.0–100.0)
Platelets: 190 10*3/uL (ref 150–400)
RBC: 4.23 MIL/uL (ref 3.87–5.11)
RDW: 14 % (ref 11.5–15.5)
WBC: 5.9 10*3/uL (ref 4.0–10.5)
nRBC: 0 % (ref 0.0–0.2)

## 2020-08-01 LAB — I-STAT CHEM 8, ED
BUN: 35 mg/dL — ABNORMAL HIGH (ref 8–23)
Calcium, Ion: 1.22 mmol/L (ref 1.15–1.40)
Chloride: 104 mmol/L (ref 98–111)
Creatinine, Ser: 1.1 mg/dL — ABNORMAL HIGH (ref 0.44–1.00)
Glucose, Bld: 114 mg/dL — ABNORMAL HIGH (ref 70–99)
HCT: 36 % (ref 36.0–46.0)
Hemoglobin: 12.2 g/dL (ref 12.0–15.0)
Potassium: 4.3 mmol/L (ref 3.5–5.1)
Sodium: 139 mmol/L (ref 135–145)
TCO2: 25 mmol/L (ref 22–32)

## 2020-08-01 LAB — SAMPLE TO BLOOD BANK

## 2020-08-01 LAB — PROTIME-INR
INR: 1.4 — ABNORMAL HIGH (ref 0.8–1.2)
Prothrombin Time: 16.5 seconds — ABNORMAL HIGH (ref 11.4–15.2)

## 2020-08-01 LAB — LACTIC ACID, PLASMA: Lactic Acid, Venous: 1.2 mmol/L (ref 0.5–1.9)

## 2020-08-01 LAB — RESP PANEL BY RT-PCR (FLU A&B, COVID) ARPGX2
Influenza A by PCR: NEGATIVE
Influenza B by PCR: NEGATIVE
SARS Coronavirus 2 by RT PCR: NEGATIVE

## 2020-08-01 LAB — ETHANOL: Alcohol, Ethyl (B): <10 mg/dL (ref <10)

## 2020-08-01 MED ORDER — MORPHINE SULFATE (PF) 4 MG/ML IV SOLN
4.0000 mg | Freq: Once | INTRAVENOUS | Status: AC
  Administered 2020-08-01: 4 mg via INTRAVENOUS
  Filled 2020-08-01: qty 1

## 2020-08-01 MED ORDER — SODIUM CHLORIDE 0.9 % IV BOLUS
1000.0000 mL | Freq: Once | INTRAVENOUS | Status: AC
  Administered 2020-08-01: 1000 mL via INTRAVENOUS

## 2020-08-01 MED ORDER — ONDANSETRON HCL 4 MG/2ML IJ SOLN
4.0000 mg | Freq: Once | INTRAMUSCULAR | Status: AC
  Administered 2020-08-01: 4 mg via INTRAVENOUS
  Filled 2020-08-01: qty 2

## 2020-08-01 NOTE — Discharge Instructions (Signed)
You do not have any fractures or dislocations today  Please wear the sling for comfort  You are already prescribed pain medicine by your doctor and if you need additional pain medicine, you can talk to your doctor about this  See your orthopedic doctor and primary care doctor for follow-up  Return to ER if you have another fall, headache, vomiting.

## 2020-08-01 NOTE — ED Provider Notes (Signed)
[facility] EMERGENCY DEPARTMENT Provider Note   CSN: [id number] Arrival date & time: 08/01/20  2049     History No chief complaint on file.   TOMIE SPIZZIRRI is a 87 y.o. female history of CKD, reflux, high cholesterol, A. Fib Eliquis, hypertension here presenting with fall.  Patient states that her legs were weak and she had a mechanical fall and fell backwards and hit her head.  Denies any loss of consciousness.  Patient has some headaches but no vomiting.  Patient is on blood thinners level 2 trauma was activated.  She also states that she may have bumped her shoulder and not sure if she dislocated her right shoulder  The history is provided by the patient.       Past Medical History:  Diagnosis Date  . Anxiety   . Aortic insufficiency   . Arthritis   . Chronic kidney disease (CKD)   . Depression   . Diverticulosis   . GERD (gastroesophageal reflux disease)   . Hypercholesterolemia   . Hypertension   . Iron deficiency anemia   . Mitral regurgitation     Patient Active Problem List   Diagnosis Date Noted  . Permanent atrial fibrillation ([place]) 04/11/2020  . Persistent atrial fibrillation ([place]) 02/29/2020  . Secondary hypercoagulable state ([place]) 02/29/2020    No past surgical history on file.   OB History   No obstetric history on file.     No family history on file.  Social History   Tobacco Use  . Smoking status: Former Smoker    Packs/day: 0.25    Years: 10.00    Pack years: 2.50    Types: Cigarettes  . Smokeless tobacco: Never Used  . Tobacco comment: She smoked off and on  Substance Use Topics  . Alcohol use: No  . Drug use: No    Home Medications Prior to Admission medications   Medication Sig Start Date End Date Taking? Authorizing Provider  amLODipine (NORVASC) 5 MG tablet Take 5 mg by mouth at bedtime. 06/21/20   [provider]  apixaban (ELIQUIS) 2.5 MG TABS tablet Take 1 tablet (2.5 mg total) by mouth 2  (two) times daily. 03/21/20   Fenton, Clint R, PA  Ascorbic Acid (VITAMIN C PO) Take 1,000 mg by mouth daily.    [provider]  busPIRone (BUSPAR) 5 MG tablet Take 5 mg by mouth 2 (two) times daily. 02/24/20   [provider]  cetirizine (ZYRTEC) 10 MG tablet Take 10 mg by mouth every morning. 01/26/20   [provider]  Cholecalciferol (VITAMIN D3) 25 MCG (1000 UT) CAPS Take 1 capsule by mouth every morning. 11/27/19   [provider]  FEROSUL 325 (65 Fe) MG tablet Take 325 mg by mouth daily.  01/26/20   [provider]  HYDROcodone-acetaminophen (NORCO/VICODIN) 5-325 MG per tablet Take by mouth every 8 (eight) hours as needed. For 30 days 07/09/14   [provider]  LORazepam (ATIVAN) 0.5 MG tablet Take 0.5 mg by mouth 2 (two) times daily as needed. 07/09/14   [provider]  metoprolol succinate (TOPROL-XL) 50 MG 24 hr tablet Take 50 mg by mouth daily. 06/29/14   [provider]  NON FORMULARY SMARTSIG:1 Tablet(s) By Mouth Twice Daily 03/23/20   [provider]  sertraline (ZOLOFT) 50 MG tablet Take 50 mg by mouth daily.  06/29/14   [provider]  [facility] injection  09/14/19   [provider]  traMADol Veatrice Bourbon)  50 MG tablet Take 1 tablet (50 mg total) by mouth every 6 (six) hours as needed. 04/28/14   Montine Circle, PA-C  triamcinolone (KENALOG) 0.1 % SMARTSIG:Sparingly Topical Twice Daily 07/10/20   [provider]    Allergies    Aspirin and Pravastatin  Review of Systems   Review of Systems  Neurological: Positive for headaches.  All other systems reviewed and are negative.   Physical Exam Updated Vital Signs BP (!) 156/80 (BP Location: Left Arm)   Temp 98.2 F (36.8 C) (Oral)   Physical Exam Vitals and nursing note reviewed.  Constitutional:      Comments: Slightly uncomfortable   HENT:     Head: Normocephalic.     Comments: Small posterior scalp hematoma    Nose: Nose  normal.     Mouth/Throat:     Mouth: Mucous membranes are moist.  Eyes:     Extraocular Movements: Extraocular movements intact.     Pupils: Pupils are equal, round, and reactive to light.  Cardiovascular:     Rate and Rhythm: Normal rate and regular rhythm.     Pulses: Normal pulses.     Heart sounds: Normal heart sounds.  Pulmonary:     Effort: Pulmonary effort is normal.     Breath sounds: Normal breath sounds.  Abdominal:     General: Abdomen is flat.     Palpations: Abdomen is soft.  Musculoskeletal:     Cervical back: Normal range of motion.     Comments: Decreased range of motion of the right shoulder but no obvious deformity.  No spinal tenderness  Skin:    General: Skin is warm.     Capillary Refill: Capillary refill takes less than 2 seconds.  Neurological:     General: No focal deficit present.     Mental Status: She is oriented to person, place, and time.     Cranial Nerves: No cranial nerve deficit.     Sensory: No sensory deficit.     Motor: No weakness.  Psychiatric:        Mood and Affect: Mood normal.        Behavior: Behavior normal.     ED Results / Procedures / Treatments   Labs (all labs ordered are listed, but only abnormal results are displayed) Labs Reviewed  COMPREHENSIVE METABOLIC PANEL - Abnormal; Notable for the following components:      Result Value   Glucose, Bld 114 (*)    BUN 33 (*)    Creatinine, Ser 1.07 (*)    GFR, Estimated 51 (*)    All other components within normal limits  CBC - Abnormal; Notable for the following components:   Hemoglobin 11.5 (*)    All other components within normal limits  PROTIME-INR - Abnormal; Notable for the following components:   Prothrombin Time 16.5 (*)    INR 1.4 (*)    All other components within normal limits  I-STAT CHEM 8, ED - Abnormal; Notable for the following components:   BUN 35 (*)    Creatinine, Ser 1.10 (*)    Glucose, Bld 114 (*)    All other components within normal limits  RESP  PANEL BY RT-PCR (FLU A&B, COVID) ARPGX2  ETHANOL  LACTIC ACID, PLASMA  URINALYSIS, ROUTINE W REFLEX MICROSCOPIC  SAMPLE TO BLOOD BANK    EKG EKG Interpretation  Date/Time:  Tuesday August 01 2020 21:01:50 EDT Ventricular Rate:  113 PR Interval:    QRS Duration: 95 QT Interval:  293 QTC Calculation: 402 R Axis:   -71 Text Interpretation: Atrial fibrillation Left anterior fascicular block Abnormal R-wave progression, early transition Borderline T abnormalities, anterior leads No significant change since last tracing Confirmed by Wandra Arthurs [phone]) on 08/01/2020 9:11:58 PM   Radiology CT HEAD WO CONTRAST  Result Date: 08/01/2020 CLINICAL DATA:  Status post fall EXAM: CT HEAD WITHOUT CONTRAST CT CERVICAL SPINE WITHOUT CONTRAST TECHNIQUE: Multidetector CT imaging of the head and cervical spine was performed following the standard protocol without intravenous contrast. Multiplanar CT image reconstructions of the cervical spine were also generated. COMPARISON:  X-ray cervical spine 02/07/2011. FINDINGS: CT HEAD FINDINGS Brain: Cerebral ventricle sizes are concordant with the degree of cerebral volume loss. Patchy and confluent areas of decreased attenuation are noted throughout the deep and periventricular white matter of the cerebral hemispheres bilaterally, compatible with chronic microvascular ischemic disease. No evidence of large-territorial acute infarction. No parenchymal hemorrhage. No mass lesion. No extra-axial collection. No mass effect or midline shift. No hydrocephalus. Basilar cisterns are patent. Vascular: No hyperdense vessel. Skull: No acute fracture or focal lesion. Sinuses/Orbits: Paranasal sinuses and mastoid air cells are clear. Left lens replacement. Otherwise the orbits are unremarkable. Other: None. CT CERVICAL SPINE FINDINGS Alignment: Normal. Skull base and vertebrae: No acute fracture. No aggressive appearing focal osseous lesion or focal pathologic process. Soft tissues  and spinal canal: No prevertebral fluid or swelling. No visible canal hematoma. Upper chest: Unremarkable. Other: None. IMPRESSION: 1. No acute intracranial abnormality. 2. No acute displaced fracture or traumatic listhesis of the cervical spine. Electronically Signed   By: Iven Finn M.D.   On: 08/01/2020 22:06   CT CERVICAL SPINE WO CONTRAST  Result Date: 08/01/2020 CLINICAL DATA:  Status post fall EXAM: CT HEAD WITHOUT CONTRAST CT CERVICAL SPINE WITHOUT CONTRAST TECHNIQUE: Multidetector CT imaging of the head and cervical spine was performed following the standard protocol without intravenous contrast. Multiplanar CT image reconstructions of the cervical spine were also generated. COMPARISON:  X-ray cervical spine 02/07/2011. FINDINGS: CT HEAD FINDINGS Brain: Cerebral ventricle sizes are concordant with the degree of cerebral volume loss. Patchy and confluent areas of decreased attenuation are noted throughout the deep and periventricular white matter of the cerebral hemispheres bilaterally, compatible with chronic microvascular ischemic disease. No evidence of large-territorial acute infarction. No parenchymal hemorrhage. No mass lesion. No extra-axial collection. No mass effect or midline shift. No hydrocephalus. Basilar cisterns are patent. Vascular: No hyperdense vessel. Skull: No acute fracture or focal lesion. Sinuses/Orbits: Paranasal sinuses and mastoid air cells are clear. Left lens replacement. Otherwise the orbits are unremarkable. Other: None. CT CERVICAL SPINE FINDINGS Alignment: Normal. Skull base and vertebrae: No acute fracture. No aggressive appearing focal osseous lesion or focal pathologic process. Soft tissues and spinal canal: No prevertebral fluid or swelling. No visible canal hematoma. Upper chest: Unremarkable. Other: None. IMPRESSION: 1. No acute intracranial abnormality. 2. No acute displaced fracture or traumatic listhesis of the cervical spine. Electronically Signed   By:  Iven Finn M.D.   On: 08/01/2020 22:06   DG Pelvis Portable  Result Date: 08/01/2020 CLINICAL DATA:  Trauma fall on blood thinners EXAM: PORTABLE PELVIS 1-2 VIEWS COMPARISON:  CT 10/24/2006 FINDINGS: SI joints are non widened. Pubic symphysis and rami appear intact. No fracture or dislocation. IMPRESSION: No acute osseous abnormality Electronically Signed   By: Donavan Foil M.D.   On: 08/01/2020 21:32   DG Chest Port 1 View  Result Date: 08/01/2020 CLINICAL DATA:  87 year old post fall on  blood thinners. Level 2 trauma. EXAM: PORTABLE CHEST 1 VIEW COMPARISON:  07/20/2014 FINDINGS: Low lung volumes. The patient is rotated. Cardiomegaly is not significantly changed from prior exam. Aortic atherosclerosis and tortuosity. No pneumothorax or large pleural effusion. Mild interstitial coarsening of unknown chronicity. Degenerative change of the right shoulder. No acute osseous abnormalities are seen. IMPRESSION: 1. Low lung volumes with cardiomegaly. Mild interstitial coarsening of unknown chronicity, likely accentuated by low lung volumes. 2. No evidence of traumatic injury. Electronically Signed   By: Keith Rake M.D.   On: 08/01/2020 21:30   DG Shoulder Right Port  Result Date: 08/01/2020 CLINICAL DATA:  Trauma, fall EXAM: PORTABLE RIGHT SHOULDER COMPARISON:  08/02/2003 FINDINGS: No fracture or malalignment. Moderate to marked degenerative change at the glenohumeral interval. Slightly high-riding humeral head suggests rotator cuff disease. IMPRESSION: No acute osseous abnormality. Moderate to marked degenerative change of the glenohumeral interval. Suspected rotator cuff disease. Electronically Signed   By: Donavan Foil M.D.   On: 08/01/2020 21:33    Procedures Procedures   Medications Ordered in ED Medications  morphine 4 MG/ML injection 4 mg (4 mg Intravenous Given 08/01/20 2129)  ondansetron (ZOFRAN) injection 4 mg (4 mg Intravenous Given 08/01/20 2128)  sodium chloride 0.9 % bolus  1,000 mL (1,000 mLs Intravenous New Bag/Given 08/01/20 2134)    ED Course  I have reviewed the triage vital signs and the nursing notes.  Pertinent labs & imaging results that were available during my care of the patient were reviewed by me and considered in my medical decision making (see chart for details).    MDM Rules/Calculators/A&P                         PORSCHIA WILLBANKS is a 87 y.o. female here with fall, R shoulder pain. Patient is on eliquis and hit her head. Will get CT head/neck. Will get shoulder xray.   11:08 PM CT head and neck unremarkable.  X-ray did not show shoulder dislocation.  Given sling for comfort. stable for discharge.    Final Clinical Impression(s) / ED Diagnoses Final diagnoses:  Fall  Fall  Fall    Rx / DC Orders ED Discharge Orders    None       Drenda Freeze, MD 08/01/20 2309

## 2020-08-01 NOTE — Progress Notes (Signed)
  Subjective:  Patient ID: Molly Neal, female    DOB: [birth year],  MRN: [id number]  Chief Complaint  Patient presents with  . Nail Problem    Foot exam and nail care    87 y.o. female returns for the above complaint. Patient presents with complaint of thickened elongated dystrophic toenails x10.  Pain on palpation.  Patient would like to have them debrided down.  She states that she is not able to do it herself.  She would like to have it professionally done.  Is painful to walk on it.  She denies any other acute complaints.  She is not a diabetic.  Objective:  There were no vitals filed for this visit. Podiatric Exam: Vascular: dorsalis pedis and posterior tibial pulses are palpable bilateral. Capillary return is immediate. Temperature gradient is WNL. Skin turgor WNL  Sensorium: Normal Semmes Weinstein monofilament test. Normal tactile sensation bilaterally. Nail Exam: Pt has thick disfigured discolored nails with subungual debris noted bilateral entire nail hallux through fifth toenails.  Pain on palpation to the nails. Ulcer Exam: There is no evidence of ulcer or pre-ulcerative changes or infection. Orthopedic Exam: Muscle tone and strength are WNL. No limitations in general ROM. No crepitus or effusions noted. HAV  B/L.  Hammer toes 2-5  B/L. Skin: No Porokeratosis. No infection or ulcers    Assessment & Plan:   1. Pain due to onychomycosis of toenails of both feet     Patient was evaluated and treated and all questions answered.  Onychomycosis with pain  -Nails palliatively debrided as below. -Educated on self-care  Procedure: Nail Debridement Rationale: pain  Type of Debridement: manual, sharp debridement. Instrumentation: Nail nipper, rotary burr. Number of Nails: 10  Procedures and Treatment: Consent by patient was obtained for treatment procedures. The patient understood the discussion of treatment and procedures well. All questions were answered thoroughly  reviewed. Debridement of mycotic and hypertrophic toenails, 1 through 5 bilateral and clearing of subungual debris. No ulceration, no infection noted.  Return Visit-Office Procedure: Patient instructed to return to the office for a follow up visit 3 months for continued evaluation and treatment.  Boneta Lucks, DPM    No follow-ups on file.

## 2020-08-01 NOTE — Progress Notes (Signed)
   08/01/20 2101  Clinical Encounter Type  Visited With Patient not available  Visit Type Trauma  Referral From Nurse  Consult/Referral To Chaplain   Chaplain responded to Level 2 trauma. No current need for chaplain. Chaplain remains available.   This note was prepared by Chaplain Resident, Dante Gang, MDiv. Chaplain remains available as needed through the on-call pager: [phone].

## 2020-08-02 ENCOUNTER — Other Ambulatory Visit: Payer: Self-pay | Admitting: *Deleted

## 2020-08-02 DIAGNOSIS — S0003XA Contusion of scalp, initial encounter: Secondary | ICD-10-CM | POA: Diagnosis not present

## 2020-08-02 NOTE — ED Triage Notes (Addendum)
Pt came in by POV from home for a fall on thinners.  Pt stated that her knees gave out in her bedroom and she fell and hit her head on a table. Pt is on Eliquis.  Pt did not have LOC and remembers everything. Hematoma noted to back of head.

## 2020-08-02 NOTE — Patient Outreach (Signed)
[facility]) Care Management  08/02/2020  Molly Neal Jun 29, 1931 [id number]  Unsuccessful outreach attempt made to patient to follow up after ED discharge on 08/01/20 . RN Health Coach left HIPAA compliant voicemail message along with her contact information and requested a callback from the patient if any health or wellness issues needed to be addressed.   Plan: RN Health Coach will call patient within the next several business days to follow up.    [name] RN, BSN [place] [phone] [email or web address]

## 2020-08-02 NOTE — Progress Notes (Signed)
Orthopedic Tech Progress Note Patient Details:  JENELLE DRENNON 1931/10/05 [id number]  Ortho Devices Type of Ortho Device: Shoulder immobilizer Ortho Device/Splint Location: R UE Ortho Device/Splint Interventions: Ordered,Application,Adjustment   Post Interventions Patient Tolerated: Well Instructions Provided: Adjustment of device,Care of device  Pt tolerated application well with no reports of pain.  Friend in room observing education and application.   Thanks,  Verdene Lennert, PT, DPT  Acute Rehabilitation [phone] pager #[phone] office     Wells Guiles B Lua Feng 08/02/2020, 12:02 AM

## 2020-08-02 NOTE — ED Notes (Signed)
DC instructions reviewed with the pt.  PT verbalized understanding.  PT Dc.   

## 2020-08-03 ENCOUNTER — Other Ambulatory Visit: Payer: Self-pay | Admitting: *Deleted

## 2020-08-03 NOTE — Patient Outreach (Signed)
[facility]) Care Management  08/03/2020  ELLAMAE LYBECK [birth year] [id number]  Unsuccessful outreach attempt made to patient to follow up after ED discharge on 08/01/20 . RN Health Coach left HIPAA compliant voicemail message along with her contact information and requested a callback from the patient if any health or wellness issues needed to be addressed.   Plan: RN Health Coach will call patient within the Month of April.  [name] RN, BSN [place] [phone] [email or web address]

## 2020-08-17 DIAGNOSIS — M1712 Unilateral primary osteoarthritis, left knee: Secondary | ICD-10-CM | POA: Diagnosis not present

## 2020-08-17 DIAGNOSIS — M170 Bilateral primary osteoarthritis of knee: Secondary | ICD-10-CM | POA: Diagnosis not present

## 2020-08-17 DIAGNOSIS — F325 Major depressive disorder, single episode, in full remission: Secondary | ICD-10-CM | POA: Diagnosis not present

## 2020-08-17 DIAGNOSIS — I10 Essential (primary) hypertension: Secondary | ICD-10-CM | POA: Diagnosis not present

## 2020-08-17 DIAGNOSIS — I480 Paroxysmal atrial fibrillation: Secondary | ICD-10-CM | POA: Diagnosis not present

## 2020-08-17 DIAGNOSIS — I4891 Unspecified atrial fibrillation: Secondary | ICD-10-CM | POA: Diagnosis not present

## 2020-08-21 DIAGNOSIS — I480 Paroxysmal atrial fibrillation: Secondary | ICD-10-CM | POA: Diagnosis not present

## 2020-08-21 DIAGNOSIS — M1712 Unilateral primary osteoarthritis, left knee: Secondary | ICD-10-CM | POA: Diagnosis not present

## 2020-08-21 DIAGNOSIS — M170 Bilateral primary osteoarthritis of knee: Secondary | ICD-10-CM | POA: Diagnosis not present

## 2020-08-21 DIAGNOSIS — I10 Essential (primary) hypertension: Secondary | ICD-10-CM | POA: Diagnosis not present

## 2020-08-21 DIAGNOSIS — F325 Major depressive disorder, single episode, in full remission: Secondary | ICD-10-CM | POA: Diagnosis not present

## 2020-08-24 DIAGNOSIS — M170 Bilateral primary osteoarthritis of knee: Secondary | ICD-10-CM | POA: Diagnosis not present

## 2020-08-24 DIAGNOSIS — M48061 Spinal stenosis, lumbar region without neurogenic claudication: Secondary | ICD-10-CM | POA: Diagnosis not present

## 2020-08-24 DIAGNOSIS — M1712 Unilateral primary osteoarthritis, left knee: Secondary | ICD-10-CM | POA: Diagnosis not present

## 2020-08-24 DIAGNOSIS — R269 Unspecified abnormalities of gait and mobility: Secondary | ICD-10-CM | POA: Diagnosis not present

## 2020-08-28 ENCOUNTER — Other Ambulatory Visit: Payer: Self-pay | Admitting: *Deleted

## 2020-08-28 NOTE — Patient Outreach (Addendum)
[facility] Care Management  08/28/2020  RAEA MAGALLON March 20, 1932 [id number]  Unsuccessful outreach attempt made to patient. RN Health Coach left HIPAA compliant voicemail message along with her contact information.  Plan: RN Health Coach will call patient within the month of May.  [name] RN, BSN [place] [phone] [email or web address]

## 2020-09-27 ENCOUNTER — Ambulatory Visit: Payer: Self-pay | Admitting: *Deleted

## 2020-10-02 ENCOUNTER — Other Ambulatory Visit: Payer: Self-pay | Admitting: *Deleted

## 2020-10-02 NOTE — Patient Outreach (Signed)
[facility]) Care Management  [place]  10/02/2020   Molly Neal 1932/04/20 [id number]  Subjective: Successful telephone outreach call to patient. HIPAA identifiers obtained. Patient reports that she continues to receive Meals on Wheels and that she now has an aide that comes to help her several times weekly. Patient states that she did receive the B/P monitor and calendar booklet this nurse mailed her previously. She has not started to take her B/P, pulse and record her values. Nurse provided education about the importance of this given her atrial fibrillation and hypertension and suggested that she have her aide take her B/P, pulse and record the values; patient stated this is a good idea and she will ask them. Patient reported that she just hung up from Dr. Carlyle Lipa office. She called to inform them that her feet were more swollen than normal. Nurse discussed for the patient to keep her legs and feet elevated as much as possible and she verbalized understanding. At this time the patient's phone begun to have static and this nurse was disconnected. Nurse called the patient back several times and left a VM on the patient's cell phone requesting that she call back. Nurse did inform the patient that she often is unable to reach her by phone and informed the patient that her home phone does not work properly.   Encounter Medications:  Outpatient Encounter Medications as of 10/02/2020  Medication Sig Note  . amLODipine (NORVASC) 5 MG tablet Take 5 mg by mouth at bedtime.   Marland Kitchen apixaban (ELIQUIS) 2.5 MG TABS tablet Take 1 tablet (2.5 mg total) by mouth 2 (two) times daily.   . Ascorbic Acid (VITAMIN C PO) Take 1,000 mg by mouth daily.   . busPIRone (BUSPAR) 5 MG tablet Take 5 mg by mouth 2 (two) times daily.   . cetirizine (ZYRTEC) 10 MG tablet Take 10 mg by mouth every morning.   . Cholecalciferol (VITAMIN D3) 25 MCG (1000 UT) CAPS Take 1 capsule by mouth every  morning.   Marland Kitchen FEROSUL 325 (65 Fe) MG tablet Take 325 mg by mouth daily.    Marland Kitchen HYDROcodone-acetaminophen (NORCO/VICODIN) 5-325 MG per tablet Take by mouth every 8 (eight) hours as needed. For 30 days   . LORazepam (ATIVAN) 0.5 MG tablet Take 0.5 mg by mouth 2 (two) times daily as needed.   . metoprolol succinate (TOPROL-XL) 50 MG 24 hr tablet Take 50 mg by mouth daily.   . NON FORMULARY SMARTSIG:1 Tablet(s) By Mouth Twice Daily   . sertraline (ZOLOFT) 50 MG tablet Take 50 mg by mouth daily.    . traMADol (ULTRAM) 50 MG tablet Take 1 tablet (50 mg total) by mouth every 6 (six) hours as needed.   . triamcinolone (KENALOG) 0.1 % SMARTSIG:Sparingly Topical Twice Daily   . SHINGRIX injection  (Patient not taking: Reported on 10/02/2020) 10/02/2020: completed   No facility-administered encounter medications on file as of 10/02/2020.    Functional Status:  No flowsheet data found.  Fall/Depression Screening: Fall Risk  04/06/2020 04/06/2020 11/30/2019  Falls in the past year? 0 0 0  Number falls in past yr: 0 0 0  Injury with Fall? 0 0 0  Risk Factor Category  - - -  Risk for fall due to : Impaired mobility;Impaired balance/gait Impaired balance/gait;Impaired mobility Impaired balance/gait;Impaired mobility  Follow up Education provided;Falls prevention discussed;Falls evaluation completed Falls prevention discussed;Education provided;Falls evaluation completed Falls prevention discussed;Education provided;Falls evaluation completed   PHQ 2/9 Scores 07/29/2019  07/24/2019 07/14/2019 09/21/2015 08/29/2015  PHQ - 2 Score 0 0 0 0 0    Assessment:  Goals Addressed            This Visit's Progress   . [facility] Patient will begin to take her B/P and pulse daily and record the values within 90 days.       Timeframe:  Long-Range Goal Priority:  High Start Date: 04/06/20                            Expected End Date: 04/18/21                      Follow Up Date 01/16/21    - check blood pressure daily -  write blood pressure results in a log or diary   -Encouraged patient to take pulse daily and record -Discussed having the patient's aide take her B/P, pulse, and record the values Why is this important?   You won't feel high blood pressure, but it can still hurt your blood vessels.  High blood pressure can cause heart or kidney problems. It can also cause a stroke.  Making lifestyle changes like losing a little weight or eating less salt will help.  Checking your blood pressure at home and at different times of the day can help to control blood pressure.  If the doctor prescribes medicine remember to take it the way the doctor ordered.  Call the office if you cannot afford the medicine or if there are questions about it.     Notes: Nurse provided education and encouraged patient to take B/P and pulse daily. Will send hypertension education.  Updated 07/06/20: Patient states her B/P monitor is broken. Nurse continued education with patient and will send patient a calendar booklet and B/P monitor.  Updated 10/02/20: Patient states she did receive the B/P monitor and calendar booklet but she has not begun to check her B/P and pulse routinely. Nurse discussed having her aide take her B/P, pulse, and record the values in her calendar booklet.      Molly Neal Patient will begin to take her pulse daily and record the values into a calendar booklet within 90 days       Timeframe:  Long-Range Goal Priority:  High Start Date:  04/06/20                           Expected End Date: 04/18/21                      Follow Up Date 10/16/20   - begin a symptom diary - bring symptom diary to all appointments - check pulse (heart) rate once a day - make a plan to eat healthy - keep all lab appointments - take medicine as prescribed  -Discussed having her aide take her  B/P, pulse and record values in the calendar booklet.   Why is this important?   Atrial fibrillation may have no symptoms. Sometimes the  symptoms get worse or happen more often.  It is important to keep track of what your symptoms are and when they happen.  A change in symptoms is important to discuss with your doctor or nurse.  Being active and healthy eating will also help you manage your heart condition.     Notes: Encouraged patient to begin to take her B/P and pulse daily  and record the values. Nurse discussed for patient to bring the values to her provider appointments and to contact providers for high and low values. Nurse will send patient atrial fibrillation education. Updated 07/06/20: Nurse continued education with patient. Nurse will send patient a calendar booklet to record values and a B/P monitor to replace hers that is broken.   Updated 10/02/20: Patient states she did receive the B/P monitor and calendar booklet but she has not begun to check her B/P and pulse routinely. Nurse discussed having her aide take her B/P, pulse, and record the values in her calendar booklet.     Molly Neal Patient will verbalize arranging transportation through her agency 1 week before her provider appointments       Timeframe:  Long-Range Goal Priority:  High Start Date:  04/06/20                           Expected End Date: 04/18/21                      Follow Up Date 01/16/21   - arrange a ride through an agency 1 week before appointment - call to cancel if needed - keep a calendar with appointment dates  -Discussed calling her transportation agency in plenty of time to ensure she has a ride to her medical appointments.   Why is this important?   Part of staying healthy is seeing the doctor for follow-up care.  If you forget your appointments, there are some things you can do to stay on track.    Notes: Nurse discussed contacting her transportation agency in plenty of time to ensure she has a ride to her medical appointments.   Updated 10/02/20: Patient states she continue to contact her transportation agency as needed to arrange  rides to her medical appointments.      Molly Neal Patient will verbalize that she is asking her providers questions if she does not understand       Timeframe:  Long-Range Goal Priority:  High Start Date:  04/06/20                           Expected End Date: 04/18/21                      Follow Up Date 01/16/21   - make a list of questions - ask questions - repeat what I heard to make sure I understand - bring a list of my medicines to the visit - speak up when I don't understand    Why is this important?   The best way to learn about your health and care is by talking to the doctor and nurse.  They will answer your questions and give you information in the way that you like best.    Notes: Nurse will send patient hypertension and atrial fibrillation education.  Updated 07/06/20: Nurse encourage patient to contact her when she has questions regarding her health and wellness.  Updated 10/02/20: Patient states she is calling her providers when needed and she is asking questions. Nurse will continue to educate patient as needed during assessment calls.       Plan: RN Health Coach will send PCP a quarterly update, will send patient education regarding atrial fibrillation and hypertension, and will call patient within the month of July. Follow-up:  Patient agrees to Care Plan  and Follow-up.   [name] RN, BSN [facility] [phone] [email or web address]

## 2020-10-02 NOTE — Patient Instructions (Addendum)
Goals Addressed            This Visit's Progress   . [facility] Patient will begin to take her B/P and pulse daily and record the values within 90 days.       Timeframe:  Long-Range Goal Priority:  High Start Date: 04/06/20                            Expected End Date: 04/18/21                      Follow Up Date 01/16/21    - check blood pressure daily - write blood pressure results in a log or diary   -Encouraged patient to take pulse daily and record -Discussed having the patient's aide take her B/P, pulse, and record the values Why is this important?   You won't feel high blood pressure, but it can still hurt your blood vessels.  High blood pressure can cause heart or kidney problems. It can also cause a stroke.  Making lifestyle changes like losing a little weight or eating less salt will help.  Checking your blood pressure at home and at different times of the day can help to control blood pressure.  If the doctor prescribes medicine remember to take it the way the doctor ordered.  Call the office if you cannot afford the medicine or if there are questions about it.     Notes: Nurse provided education and encouraged patient to take B/P and pulse daily. Will send hypertension education.  Updated 07/06/20: Patient states her B/P monitor is broken. Nurse continued education with patient and will send patient a calendar booklet and B/P monitor.  Updated 10/02/20: Patient states she did receive the B/P monitor and calendar booklet but she has not begun to check her B/P and pulse routinely. Nurse discussed having her aide take her B/P, pulse, and record the values in her calendar booklet.      Molly Neal. [facility]N Patient will begin to take her pulse daily and record the values into a calendar booklet within 90 days       Timeframe:  Long-Range Goal Priority:  High Start Date:  04/06/20                           Expected End Date: 04/18/21                      Follow Up Date 10/16/20   - begin a  symptom diary - bring symptom diary to all appointments - check pulse (heart) rate once a day - make a plan to eat healthy - keep all lab appointments - take medicine as prescribed  -Discussed having her aide take her  B/P, pulse and record values in the calendar booklet.   Why is this important?   Atrial fibrillation may have no symptoms. Sometimes the symptoms get worse or happen more often.  It is important to keep track of what your symptoms are and when they happen.  A change in symptoms is important to discuss with your doctor or nurse.  Being active and healthy eating will also help you manage your heart condition.     Notes: Encouraged patient to begin to take her B/P and pulse daily and record the values. Nurse discussed for patient to bring the values to her provider appointments and to contact providers  for high and low values. Nurse will send patient atrial fibrillation education. Updated 07/06/20: Nurse continued education with patient. Nurse will send patient a calendar booklet to record values and a B/P monitor to replace hers that is broken.   Updated 10/02/20: Patient states she did receive the B/P monitor and calendar booklet but she has not begun to check her B/P and pulse routinely. Nurse discussed having her aide take her B/P, pulse, and record the values in her calendar booklet.     Molly Neal Patient will verbalize arranging transportation through her agency 1 week before her provider appointments       Timeframe:  Long-Range Goal Priority:  High Start Date:  04/06/20                           Expected End Date: 04/18/21                      Follow Up Date 01/16/21   - arrange a ride through an agency 1 week before appointment - call to cancel if needed - keep a calendar with appointment dates  -Discussed calling her transportation agency in plenty of time to ensure she has a ride to her medical appointments.   Why is this important?   Part of staying healthy is seeing  the doctor for follow-up care.  If you forget your appointments, there are some things you can do to stay on track.    Notes: Nurse discussed contacting her transportation agency in plenty of time to ensure she has a ride to her medical appointments.   Updated 10/02/20: Patient states she continue to contact her transportation agency as needed to arrange rides to her medical appointments.      Molly Neal Patient will verbalize that she is asking her providers questions if she does not understand       Timeframe:  Long-Range Goal Priority:  High Start Date:  04/06/20                           Expected End Date: 04/18/21                      Follow Up Date 01/16/21   - make a list of questions - ask questions - repeat what I heard to make sure I understand - bring a list of my medicines to the visit - speak up when I don't understand    Why is this important?   The best way to learn about your health and care is by talking to the doctor and nurse.  They will answer your questions and give you information in the way that you like best.    Notes: Nurse will send patient hypertension and atrial fibrillation education.  Updated 07/06/20: Nurse encourage patient to contact her when she has questions regarding her health and wellness.  Updated 10/02/20: Patient states she is calling her providers when needed and she is asking questions. Nurse will continue to educate patient as needed during assessment calls.        Atrial Fibrillation  Atrial fibrillation is a type of heartbeat that is irregular or fast. If you have this condition, your heart beats without any order. This makes it hard for your heart to pump blood in a normal way. Atrial fibrillation may come and go, or it may become a long-lasting problem. If  this condition is not treated, it can put you at higher risk for stroke, heart failure, and other heart problems. What are the causes? This condition may be caused by diseases that  damage the heart. They include:  High blood pressure.  Heart failure.  Heart valve disease.  Heart surgery. Other causes include:  Diabetes.  Thyroid disease.  Being overweight.  Kidney disease. Sometimes the cause is not known. What increases the risk? You are more likely to develop this condition if:  You are older.  You smoke.  You exercise often and very hard.  You have a family history of this condition.  You are a man.  You use drugs.  You drink a lot of alcohol.  You have lung conditions, such as emphysema, pneumonia, or COPD.  You have sleep apnea. What are the signs or symptoms? Common symptoms of this condition include:  A feeling that your heart is beating very fast.  Chest pain or discomfort.  Feeling short of breath.  Suddenly feeling light-headed or weak.  Getting tired easily during activity.  Fainting.  Sweating. In some cases, there are no symptoms. How is this treated? Treatment for this condition depends on underlying conditions and how you feel when you have atrial fibrillation. They include:  Medicines to: ? Prevent blood clots. ? Treat heart rate or heart rhythm problems.  Using devices, such as a pacemaker, to correct heart rhythm problems.  Doing surgery to remove the part of the heart that sends bad signals.  Closing an area where clots can form in the heart (left atrial appendage). In some cases, your doctor will treat other underlying conditions. Follow these instructions at home: Medicines  Take over-the-counter and prescription medicines only as told by your doctor.  Do not take any new medicines without first talking to your doctor.  If you are taking blood thinners: ? Talk with your doctor before you take any medicines that have aspirin or NSAIDs, such as ibuprofen, in them. ? Take your medicine exactly as told by your doctor. Take it at the same time each day. ? Avoid activities that could hurt or bruise  you. Follow instructions about how to prevent falls. ? Wear a bracelet that says you are taking blood thinners. Or, carry a card that lists what medicines you take. Lifestyle  Do not use any products that have nicotine or tobacco in them. These include cigarettes, e-cigarettes, and chewing tobacco. If you need help quitting, ask your doctor.  Eat heart-healthy foods. Talk with your doctor about the right eating plan for you.  Exercise regularly as told by your doctor.  Do not drink alcohol.  Lose weight if you are overweight.  Do not use drugs, including cannabis.      General instructions  If you have a condition that causes breathing to stop for a short period of time (apnea), treat it as told by your doctor.  Keep a healthy weight. Do not use diet pills unless your doctor says they are safe for you. Diet pills may make heart problems worse.  Keep all follow-up visits as told by your doctor. This is important. Contact a doctor if:  You notice a change in the speed, rhythm, or strength of your heartbeat.  You are taking a blood-thinning medicine and you get more bruising.  You get tired more easily when you move or exercise.  You have a sudden change in weight. Get help right away if:  You have pain in your chest  or your belly (abdomen).  You have trouble breathing.  You have side effects of blood thinners, such as blood in your vomit, poop (stool), or pee (urine), or bleeding that cannot stop.  You have any signs of a stroke. "BE FAST" is an easy way to remember the main warning signs: ? B - Balance. Signs are dizziness, sudden trouble walking, or loss of balance. ? E - Eyes. Signs are trouble seeing or a change in how you see. ? F - Face. Signs are sudden weakness or loss of feeling in the face, or the face or eyelid drooping on one side. ? A - Arms. Signs are weakness or loss of feeling in an arm. This happens suddenly and usually on one side of the body. ? S -  Speech. Signs are sudden trouble speaking, slurred speech, or trouble understanding what people say. ? T - Time. Time to call emergency services. Write down what time symptoms started.  You have other signs of a stroke, such as: ? A sudden, very bad headache with no known cause. ? Feeling like you may vomit (nausea). ? Vomiting. ? A seizure. These symptoms may be an emergency. Do not wait to see if the symptoms will go away. Get medical help right away. Call your local emergency services (911 in the [place] Do not drive yourself to the hospital.   Summary  Atrial fibrillation is a type of heartbeat that is irregular or fast.  You are at higher risk of this condition if you smoke, are older, have diabetes, or are overweight.  Follow your doctor's instructions about medicines, diet, exercise, and follow-up visits.  Get help right away if you have signs or symptoms of a stroke.  Get help right away if you cannot catch your breath, or you have chest pain or discomfort. This information is not intended to replace advice given to you by your health care provider. Make sure you discuss any questions you have with your health care provider. Document Revised: 10/28/2018 Document Reviewed: 10/28/2018 Elsevier Patient Education  2021 [place].   Hypertension, Adult Hypertension is another name for high blood pressure. High blood pressure forces your heart to work harder to pump blood. This can cause problems over time. There are two numbers in a blood pressure reading. There is a top number (systolic) over a bottom number (diastolic). It is best to have a blood pressure that is below 120/80. Healthy choices can help lower your blood pressure, or you may need medicine to help lower it. What are the causes? The cause of this condition is not known. Some conditions may be related to high blood pressure. What increases the risk? Smoking. Having type 2 diabetes mellitus, high cholesterol, or  both. Not getting enough exercise or physical activity. Being overweight. Having too much fat, sugar, calories, or salt (sodium) in your diet. Drinking too much alcohol. Having long-term (chronic) kidney disease. Having a family history of high blood pressure. Age. Risk increases with age. Race. You may be at higher risk if you are African American. Gender. Men are at higher risk than women before age 49. After age 55, women are at higher risk than men. Having obstructive sleep apnea. Stress. What are the signs or symptoms? High blood pressure may not cause symptoms. Very high blood pressure (hypertensive crisis) may cause: Headache. Feelings of worry or nervousness (anxiety). Shortness of breath. Nosebleed. A feeling of being sick to your stomach (nausea). Throwing up (vomiting). Changes in how you see.  Very bad chest pain. Seizures. How is this treated? This condition is treated by making healthy lifestyle changes, such as: Eating healthy foods. Exercising more. Drinking less alcohol. Your health care provider may prescribe medicine if lifestyle changes are not enough to get your blood pressure under control, and if: Your top number is above 130. Your bottom number is above 80. Your personal target blood pressure may vary. Follow these instructions at home: Eating and drinking If told, follow the DASH eating plan. To follow this plan: Fill one half of your plate at each meal with fruits and vegetables. Fill one fourth of your plate at each meal with whole grains. Whole grains include whole-wheat pasta, brown rice, and whole-grain bread. Eat or drink low-fat dairy products, such as skim milk or low-fat yogurt. Fill one fourth of your plate at each meal with low-fat (lean) proteins. Low-fat proteins include fish, chicken without skin, eggs, beans, and tofu. Avoid fatty meat, cured and processed meat, or chicken with skin. Avoid pre-made or processed food. Eat less than 1,500  mg of salt each day. Do not drink alcohol if: Your doctor tells you not to drink. You are pregnant, may be pregnant, or are planning to become pregnant. If you drink alcohol: Limit how much you use to: 0-1 drink a day for women. 0-2 drinks a day for men. Be aware of how much alcohol is in your drink. In the [place] one drink equals one 12 oz bottle of beer (355 mL), one 5 oz glass of Lesleigh Hughson (148 mL), or one 1 oz glass of hard liquor (44 mL).   Lifestyle Work with your doctor to stay at a healthy weight or to lose weight. Ask your doctor what the best weight is for you. Get at least 30 minutes of exercise most days of the week. This may include walking, swimming, or biking. Get at least 30 minutes of exercise that strengthens your muscles (resistance exercise) at least 3 days a week. This may include lifting weights or doing Pilates. Do not use any products that contain nicotine or tobacco, such as cigarettes, e-cigarettes, and chewing tobacco. If you need help quitting, ask your doctor. Check your blood pressure at home as told by your doctor. Keep all follow-up visits as told by your doctor. This is important.   Medicines Take over-the-counter and prescription medicines only as told by your doctor. Follow directions carefully. Do not skip doses of blood pressure medicine. The medicine does not work as well if you skip doses. Skipping doses also puts you at risk for problems. Ask your doctor about side effects or reactions to medicines that you should watch for. Contact a doctor if you: Think you are having a reaction to the medicine you are taking. Have headaches that keep coming back (recurring). Feel dizzy. Have swelling in your ankles. Have trouble with your vision. Get help right away if you: Get a very bad headache. Start to feel mixed up (confused). Feel weak or numb. Feel faint. Have very bad pain in your: Chest. Belly (abdomen). Throw up more than once. Have trouble  breathing. Summary Hypertension is another name for high blood pressure. High blood pressure forces your heart to work harder to pump blood. For most people, a normal blood pressure is less than 120/80. Making healthy choices can help lower blood pressure. If your blood pressure does not get lower with healthy choices, you may need to take medicine. This information is not intended to replace advice given to you  by your health care provider. Make sure you discuss any questions you have with your health care provider. Document Revised: 01/14/2018 Document Reviewed: 01/14/2018 Elsevier Patient Education  2021 [employer].

## 2020-10-09 ENCOUNTER — Other Ambulatory Visit (HOSPITAL_COMMUNITY): Payer: Self-pay | Admitting: Physician Assistant

## 2020-10-09 NOTE — Telephone Encounter (Signed)
Prescription refill request for Eliquis received. Indication: afib  Last office visit: [place] 07/21/2020 Scr: 1.10, 08/01/2020  Age: 87 yo  Weight: 51.1 kg   Pt is on the correct dose of Eliquis per dosing criteria, prescription refill sent for Eliquis 2.5mg  BID.

## 2020-10-17 DIAGNOSIS — I4891 Unspecified atrial fibrillation: Secondary | ICD-10-CM | POA: Diagnosis not present

## 2020-10-17 DIAGNOSIS — I480 Paroxysmal atrial fibrillation: Secondary | ICD-10-CM | POA: Diagnosis not present

## 2020-10-17 DIAGNOSIS — M1712 Unilateral primary osteoarthritis, left knee: Secondary | ICD-10-CM | POA: Diagnosis not present

## 2020-10-17 DIAGNOSIS — I10 Essential (primary) hypertension: Secondary | ICD-10-CM | POA: Diagnosis not present

## 2020-10-17 DIAGNOSIS — M170 Bilateral primary osteoarthritis of knee: Secondary | ICD-10-CM | POA: Diagnosis not present

## 2020-10-17 DIAGNOSIS — F325 Major depressive disorder, single episode, in full remission: Secondary | ICD-10-CM | POA: Diagnosis not present

## 2020-11-02 DIAGNOSIS — R059 Cough, unspecified: Secondary | ICD-10-CM | POA: Diagnosis not present

## 2020-11-02 DIAGNOSIS — U071 COVID-19: Secondary | ICD-10-CM | POA: Diagnosis not present

## 2020-11-02 DIAGNOSIS — M170 Bilateral primary osteoarthritis of knee: Secondary | ICD-10-CM | POA: Diagnosis not present

## 2020-11-02 DIAGNOSIS — J069 Acute upper respiratory infection, unspecified: Secondary | ICD-10-CM | POA: Diagnosis not present

## 2020-11-14 DIAGNOSIS — M170 Bilateral primary osteoarthritis of knee: Secondary | ICD-10-CM | POA: Diagnosis not present

## 2020-11-14 DIAGNOSIS — I10 Essential (primary) hypertension: Secondary | ICD-10-CM | POA: Diagnosis not present

## 2020-11-14 DIAGNOSIS — M1712 Unilateral primary osteoarthritis, left knee: Secondary | ICD-10-CM | POA: Diagnosis not present

## 2020-11-14 DIAGNOSIS — I480 Paroxysmal atrial fibrillation: Secondary | ICD-10-CM | POA: Diagnosis not present

## 2020-11-14 DIAGNOSIS — F325 Major depressive disorder, single episode, in full remission: Secondary | ICD-10-CM | POA: Diagnosis not present

## 2020-12-12 ENCOUNTER — Other Ambulatory Visit: Payer: Self-pay | Admitting: *Deleted

## 2020-12-12 NOTE — Patient Outreach (Signed)
[facility]) Care Management  12/12/2020  Molly Neal 09/01/1931 [id number]  Unsuccessful outreach attempt made to patient. RN Health Coach left HIPAA compliant voicemail message along with her contact information.  Plan: RN Health Coach will call patient within the month of August.  [name] RN, BSN [place] [phone] [email or web address]

## 2020-12-14 DIAGNOSIS — M1712 Unilateral primary osteoarthritis, left knee: Secondary | ICD-10-CM | POA: Diagnosis not present

## 2020-12-14 DIAGNOSIS — I480 Paroxysmal atrial fibrillation: Secondary | ICD-10-CM | POA: Diagnosis not present

## 2020-12-14 DIAGNOSIS — I10 Essential (primary) hypertension: Secondary | ICD-10-CM | POA: Diagnosis not present

## 2020-12-14 DIAGNOSIS — M170 Bilateral primary osteoarthritis of knee: Secondary | ICD-10-CM | POA: Diagnosis not present

## 2020-12-14 DIAGNOSIS — F325 Major depressive disorder, single episode, in full remission: Secondary | ICD-10-CM | POA: Diagnosis not present

## 2020-12-25 ENCOUNTER — Other Ambulatory Visit: Payer: Self-pay | Admitting: *Deleted

## 2020-12-25 NOTE — Patient Instructions (Addendum)
Goals Addressed             This Visit's Progress    [facility]N Patient will begin to take her B/P and pulse daily and record the values within 90 days.       Timeframe:  Long-Range Goal Priority:  High Start Date: 04/06/20                            Expected End Date: 04/18/21                      Follow Up Date 01/16/21    - check blood pressure daily - write blood pressure results in a log or diary   -Encouraged patient to take pulse daily and record -Discussed having the patient's aide take her B/P, pulse, and record the values  Why is this important?   You won't feel high blood pressure, but it can still hurt your blood vessels.  High blood pressure can cause heart or kidney problems. It can also cause a stroke.  Making lifestyle changes like losing a little weight or eating less salt will help.  Checking your blood pressure at home and at different times of the day can help to control blood pressure.  If the doctor prescribes medicine remember to take it the way the doctor ordered.  Call the office if you cannot afford the medicine or if there are questions about it.     Notes: Nurse provided education and encouraged patient to take B/P and pulse daily. Will send hypertension education.  Updated 07/06/20: Patient states her B/P monitor is broken. Nurse continued education with patient and will send patient a calendar booklet and B/P monitor.  Updated 10/02/20: Patient states she did receive the B/P monitor and calendar booklet but she has not begun to check her B/P and pulse routinely. Nurse discussed having her aide take her B/P, pulse, and record the values in her calendar booklet.   Updated 12/25/20: Updated 12/25/20: Patient states that her pulse and B/P gets taking periodically. Nurse provided education and the patient states she will inform her aide on the days she is there to take her B/P, pulse and record the values in her calendar booklet. Nurse will send patient eduction about how  to take your blood pressure.     THN Patient will begin to take her pulse daily and record the values into a calendar booklet within 90 days       Timeframe:  Long-Range Goal Priority:  High Start Date:  04/06/20                           Expected End Date: 04/18/21                      Follow Up Date 04/18/21   - begin a symptom diary - bring symptom diary to all appointments - check pulse (heart) rate once a day - make a plan to eat healthy - keep all lab appointments - take medicine as prescribed  -Discussed having her aide take her  B/P, pulse and record values in the calendar booklet.   Why is this important?   Atrial fibrillation may have no symptoms. Sometimes the symptoms get worse or happen more often.  It is important to keep track of what your symptoms are and when they happen.  A change in symptoms  is important to discuss with your doctor or nurse.  Being active and healthy eating will also help you manage your heart condition.     Notes: Encouraged patient to begin to take her B/P and pulse daily and record the values. Nurse discussed for patient to bring the values to her provider appointments and to contact providers for high and low values. Nurse will send patient atrial fibrillation education. Updated 07/06/20: Nurse continued education with patient. Nurse will send patient a calendar booklet to record values and a B/P monitor to replace hers that is broken.   Updated 10/02/20: Patient states she did receive the B/P monitor and calendar booklet but she has not begun to check her B/P and pulse routinely. Nurse discussed having her aide take her B/P, pulse, and record the values in her calendar booklet.   Updated 12/25/20: Patient states that her pulse and B/P gets taking periodically. Nurse provided education and the patient states she will inform her aide on the days she is there to take her B/P, pulse and record the values in her calendar booklet. Nurse will send atrial  fibrillation eduction and discussed the importance of monitoring her pulse and recording the value.     THN Patient will verbalize arranging transportation through her agency 1 week before her provider appointments       Timeframe:  Long-Range Goal Priority:  High Start Date:  04/06/20                           Expected End Date: 04/18/21                      Follow Up Date 04/18/21   - arrange a ride through an agency 1 week before appointment - call to cancel if needed - keep a calendar with appointment dates  -Discussed calling her transportation agency in plenty of time to ensure she has a ride to her medical appointments.   Why is this important?   Part of staying healthy is seeing the doctor for follow-up care.  If you forget your appointments, there are some things you can do to stay on track.    Notes: Nurse discussed contacting her transportation agency in plenty of time to ensure she has a ride to her medical appointments.   Updated 10/02/20: Patient states she continues to contact her transportation agency as needed to arrange rides to her medical appointments.   Updated 12/25/20: Patient states that her transportation needs are being met presently.      THN Patient will verbalize that she is asking her providers questions if she does not understand       Timeframe:  Long-Range Goal Priority:  High Start Date:  04/06/20                           Expected End Date: 04/18/21                      Follow Up Date 04/18/21   - make a list of questions - ask questions - repeat what I heard to make sure I understand - bring a list of my medicines to the visit - speak up when I don't understand    Why is this important?   The best way to learn about your health and care is by talking to the doctor and nurse.  They will  answer your questions and give you information in the way that you like best.    Notes: Nurse will send patient hypertension and atrial fibrillation  education.  Updated 07/06/20: Nurse encourage patient to contact her when she has questions regarding her health and wellness.  Updated 10/02/20: Patient states she is calling her providers when needed and she is asking questions. Nurse will continue to educate patient as needed during assessment calls.   12/25/20: Patient continues to contact her providers as needed. Patient reports that she does ask questions if she does not understand and states she will call this nurse for additional education and assistance as needed.

## 2020-12-25 NOTE — Patient Outreach (Signed)
[facility]) Care Management  [place]  12/25/2020   Molly Neal Jul 24, 1931 [id number]  Subjective: Patient states he/she is dong well at this time. Nurse discussed with patient her health goals and her health and wellness needs which were documented in the Epic system. Patient did not have any further questions or concerns today and did confirm that she has this nurse's contact number to call her if needed.   Encounter Medications:  Outpatient Encounter Medications as of 12/25/2020  Medication Sig Note   amLODipine (NORVASC) 5 MG tablet Take 5 mg by mouth at bedtime.    apixaban (ELIQUIS) 2.5 MG TABS tablet Take 1 tablet (2.5 mg total) by mouth 2 (two) times daily.    Ascorbic Acid (VITAMIN C PO) Take 1,000 mg by mouth daily.    busPIRone (BUSPAR) 5 MG tablet Take 5 mg by mouth 2 (two) times daily.    cetirizine (ZYRTEC) 10 MG tablet Take 10 mg by mouth every morning.    Cholecalciferol (VITAMIN D3) 25 MCG (1000 UT) CAPS Take 1 capsule by mouth every morning.    FEROSUL 325 (65 Fe) MG tablet Take 325 mg by mouth daily.     HYDROcodone-acetaminophen (NORCO/VICODIN) 5-325 MG per tablet Take by mouth every 8 (eight) hours as needed. For 30 days    LORazepam (ATIVAN) 0.5 MG tablet Take 0.5 mg by mouth 2 (two) times daily as needed.    metoprolol succinate (TOPROL-XL) 50 MG 24 hr tablet Take 50 mg by mouth daily.    NON FORMULARY SMARTSIG:1 Tablet(s) By Mouth Twice Daily    sertraline (ZOLOFT) 50 MG tablet Take 50 mg by mouth daily.     traMADol (ULTRAM) 50 MG tablet Take 1 tablet (50 mg total) by mouth every 6 (six) hours as needed.    triamcinolone (KENALOG) 0.1 % SMARTSIG:Sparingly Topical Twice Daily    SHINGRIX injection  (Patient not taking: No sig reported) 10/02/2020: completed   No facility-administered encounter medications on file as of 12/25/2020.    Functional Status:  No flowsheet data found.  Fall/Depression Screening: Fall Risk  12/25/2020  04/06/2020 04/06/2020  Falls in the past year? 1 0 0  Number falls in past yr: 0 0 0  Injury with Fall? 0 0 0  Risk Factor Category  - - -  Risk for fall due to : Impaired mobility;Impaired balance/gait;History of fall(s) Impaired mobility;Impaired balance/gait Impaired balance/gait;Impaired mobility  Follow up Falls prevention discussed;Education provided;Falls evaluation completed Education provided;Falls prevention discussed;Falls evaluation completed Falls prevention discussed;Education provided;Falls evaluation completed   PHQ 2/9 Scores 07/29/2019 07/24/2019 07/14/2019 09/21/2015 08/29/2015  PHQ - 2 Score 0 0 0 0 0    Assessment:   Care Plan There are no care plans that you recently modified to display for this patient.    Goals Addressed             This Visit's Progress    THN Patient will begin to take her B/P and pulse daily and record the values within 90 days.       Timeframe:  Long-Range Goal Priority:  High Start Date: 04/06/20                            Expected End Date: 04/18/21                      Follow Up Date 01/16/21    - check blood pressure  daily - write blood pressure results in a log or diary   -Encouraged patient to take pulse daily and record -Discussed having the patient's aide take her B/P, pulse, and record the values  Why is this important?   You won't feel high blood pressure, but it can still hurt your blood vessels.  High blood pressure can cause heart or kidney problems. It can also cause a stroke.  Making lifestyle changes like losing a little weight or eating less salt will help.  Checking your blood pressure at home and at different times of the day can help to control blood pressure.  If the doctor prescribes medicine remember to take it the way the doctor ordered.  Call the office if you cannot afford the medicine or if there are questions about it.     Notes: Nurse provided education and encouraged patient to take B/P and pulse daily.  Will send hypertension education.  Updated 07/06/20: Patient states her B/P monitor is broken. Nurse continued education with patient and will send patient a calendar booklet and B/P monitor.  Updated 10/02/20: Patient states she did receive the B/P monitor and calendar booklet but she has not begun to check her B/P and pulse routinely. Nurse discussed having her aide take her B/P, pulse, and record the values in her calendar booklet.   Updated 12/25/20: Updated 12/25/20: Patient states that her pulse and B/P gets taking periodically. Nurse provided education and the patient states she will inform her aide on the days she is there to take her B/P, pulse and record the values in her calendar booklet. Nurse will send patient eduction about how to take your blood pressure.     THN Patient will begin to take her pulse daily and record the values into a calendar booklet within 90 days       Timeframe:  Long-Range Goal Priority:  High Start Date:  04/06/20                           Expected End Date: 04/18/21                      Follow Up Date 04/18/21   - begin a symptom diary - bring symptom diary to all appointments - check pulse (heart) rate once a day - make a plan to eat healthy - keep all lab appointments - take medicine as prescribed  -Discussed having her aide take her  B/P, pulse and record values in the calendar booklet.   Why is this important?   Atrial fibrillation may have no symptoms. Sometimes the symptoms get worse or happen more often.  It is important to keep track of what your symptoms are and when they happen.  A change in symptoms is important to discuss with your doctor or nurse.  Being active and healthy eating will also help you manage your heart condition.     Notes: Encouraged patient to begin to take her B/P and pulse daily and record the values. Nurse discussed for patient to bring the values to her provider appointments and to contact providers for high and low values.  Nurse will send patient atrial fibrillation education. Updated 07/06/20: Nurse continued education with patient. Nurse will send patient a calendar booklet to record values and a B/P monitor to replace hers that is broken.   Updated 10/02/20: Patient states she did receive the B/P monitor and calendar booklet but she has  not begun to check her B/P and pulse routinely. Nurse discussed having her aide take her B/P, pulse, and record the values in her calendar booklet.   Updated 12/25/20: Patient states that her pulse and B/P gets taking periodically. Nurse provided education and the patient states she will inform her aide on the days she is there to take her B/P, pulse and record the values in her calendar booklet. Nurse will send atrial fibrillation eduction and discussed the importance of monitoring her pulse and recording the value.     THN Patient will verbalize arranging transportation through her agency 1 week before her provider appointments       Timeframe:  Long-Range Goal Priority:  High Start Date:  04/06/20                           Expected End Date: 04/18/21                      Follow Up Date 04/18/21   - arrange a ride through an agency 1 week before appointment - call to cancel if needed - keep a calendar with appointment dates  -Discussed calling her transportation agency in plenty of time to ensure she has a ride to her medical appointments.   Why is this important?   Part of staying healthy is seeing the doctor for follow-up care.  If you forget your appointments, there are some things you can do to stay on track.    Notes: Nurse discussed contacting her transportation agency in plenty of time to ensure she has a ride to her medical appointments.   Updated 10/02/20: Patient states she continues to contact her transportation agency as needed to arrange rides to her medical appointments.   Updated 12/25/20: Patient states that her transportation needs are being met presently.       THN Patient will verbalize that she is asking her providers questions if she does not understand       Timeframe:  Long-Range Goal Priority:  High Start Date:  04/06/20                           Expected End Date: 04/18/21                      Follow Up Date 04/18/21   - make a list of questions - ask questions - repeat what I heard to make sure I understand - bring a list of my medicines to the visit - speak up when I don't understand    Why is this important?   The best way to learn about your health and care is by talking to the doctor and nurse.  They will answer your questions and give you information in the way that you like best.    Notes: Nurse will send patient hypertension and atrial fibrillation education.  Updated 07/06/20: Nurse encourage patient to contact her when she has questions regarding her health and wellness.  Updated 10/02/20: Patient states she is calling her providers when needed and she is asking questions. Nurse will continue to educate patient as needed during assessment calls.   12/25/20: Patient continues to contact her providers as needed. Patient reports that she does ask questions if she does not understand and states she will call this nurse for additional education and assistance as needed.  Plan: RN Health Coach will send PCP a quarterly report, will send patient hypertension and atrial fibrillation education, and will call patient within the month of November. Follow-up: Patient agrees to Care Plan and Follow-up.  Molly Loron RN, BSN [place] [phone] [email or web address]

## 2021-01-12 DIAGNOSIS — I10 Essential (primary) hypertension: Secondary | ICD-10-CM | POA: Diagnosis not present

## 2021-01-12 DIAGNOSIS — F325 Major depressive disorder, single episode, in full remission: Secondary | ICD-10-CM | POA: Diagnosis not present

## 2021-01-12 DIAGNOSIS — I480 Paroxysmal atrial fibrillation: Secondary | ICD-10-CM | POA: Diagnosis not present

## 2021-01-12 DIAGNOSIS — M1712 Unilateral primary osteoarthritis, left knee: Secondary | ICD-10-CM | POA: Diagnosis not present

## 2021-01-12 DIAGNOSIS — M170 Bilateral primary osteoarthritis of knee: Secondary | ICD-10-CM | POA: Diagnosis not present

## 2021-01-12 DIAGNOSIS — I4891 Unspecified atrial fibrillation: Secondary | ICD-10-CM | POA: Diagnosis not present

## 2021-01-23 DIAGNOSIS — M170 Bilateral primary osteoarthritis of knee: Secondary | ICD-10-CM | POA: Diagnosis not present

## 2021-02-13 DIAGNOSIS — F325 Major depressive disorder, single episode, in full remission: Secondary | ICD-10-CM | POA: Diagnosis not present

## 2021-02-13 DIAGNOSIS — I480 Paroxysmal atrial fibrillation: Secondary | ICD-10-CM | POA: Diagnosis not present

## 2021-02-13 DIAGNOSIS — I10 Essential (primary) hypertension: Secondary | ICD-10-CM | POA: Diagnosis not present

## 2021-02-13 DIAGNOSIS — M170 Bilateral primary osteoarthritis of knee: Secondary | ICD-10-CM | POA: Diagnosis not present

## 2021-02-13 DIAGNOSIS — I4891 Unspecified atrial fibrillation: Secondary | ICD-10-CM | POA: Diagnosis not present

## 2021-03-13 DIAGNOSIS — I10 Essential (primary) hypertension: Secondary | ICD-10-CM | POA: Diagnosis not present

## 2021-03-13 DIAGNOSIS — M170 Bilateral primary osteoarthritis of knee: Secondary | ICD-10-CM | POA: Diagnosis not present

## 2021-03-13 DIAGNOSIS — I480 Paroxysmal atrial fibrillation: Secondary | ICD-10-CM | POA: Diagnosis not present

## 2021-03-13 DIAGNOSIS — F325 Major depressive disorder, single episode, in full remission: Secondary | ICD-10-CM | POA: Diagnosis not present

## 2021-03-13 DIAGNOSIS — I4891 Unspecified atrial fibrillation: Secondary | ICD-10-CM | POA: Diagnosis not present

## 2021-03-13 DIAGNOSIS — M1712 Unilateral primary osteoarthritis, left knee: Secondary | ICD-10-CM | POA: Diagnosis not present

## 2021-03-27 ENCOUNTER — Other Ambulatory Visit: Payer: Self-pay | Admitting: *Deleted

## 2021-03-27 NOTE — Patient Outreach (Addendum)
[place] [facility]) Care Management  03/27/2021  LOWELL MCGURK 1931/10/22 [id number]  Unsuccessful outreach attempt made to patient. RN Health Coach left HIPAA compliant voicemail message along with her contact information.  Plan: RN Health Coach will call patient within the month of December.  [name] RN, BSN [place] [phone] [email or web address]

## 2021-04-05 DIAGNOSIS — Z23 Encounter for immunization: Secondary | ICD-10-CM | POA: Diagnosis not present

## 2021-04-05 DIAGNOSIS — I480 Paroxysmal atrial fibrillation: Secondary | ICD-10-CM | POA: Diagnosis not present

## 2021-04-05 DIAGNOSIS — D6869 Other thrombophilia: Secondary | ICD-10-CM | POA: Diagnosis not present

## 2021-04-05 DIAGNOSIS — Z79899 Other long term (current) drug therapy: Secondary | ICD-10-CM | POA: Diagnosis not present

## 2021-04-05 DIAGNOSIS — E46 Unspecified protein-calorie malnutrition: Secondary | ICD-10-CM | POA: Diagnosis not present

## 2021-04-05 DIAGNOSIS — M48061 Spinal stenosis, lumbar region without neurogenic claudication: Secondary | ICD-10-CM | POA: Diagnosis not present

## 2021-04-05 DIAGNOSIS — I10 Essential (primary) hypertension: Secondary | ICD-10-CM | POA: Diagnosis not present

## 2021-04-09 ENCOUNTER — Other Ambulatory Visit: Payer: Self-pay

## 2021-04-09 MED ORDER — APIXABAN 2.5 MG PO TABS: 2.5000 mg | ORAL_TABLET | Freq: Two times a day (BID) | ORAL | 5 refills | Status: DC

## 2021-04-09 NOTE — Telephone Encounter (Signed)
Pt last saw Dr Johney Frame 07/21/20, last labs 08/01/20 Creat 1.10, age 87, weight 51.1kg, based on specified criteria pt is on appropriate dosage of Eliquis 2.5mg  BID for afib.  Will refill rx.

## 2021-04-13 DIAGNOSIS — M1712 Unilateral primary osteoarthritis, left knee: Secondary | ICD-10-CM | POA: Diagnosis not present

## 2021-04-13 DIAGNOSIS — M170 Bilateral primary osteoarthritis of knee: Secondary | ICD-10-CM | POA: Diagnosis not present

## 2021-04-13 DIAGNOSIS — I480 Paroxysmal atrial fibrillation: Secondary | ICD-10-CM | POA: Diagnosis not present

## 2021-04-13 DIAGNOSIS — I10 Essential (primary) hypertension: Secondary | ICD-10-CM | POA: Diagnosis not present

## 2021-04-13 DIAGNOSIS — F325 Major depressive disorder, single episode, in full remission: Secondary | ICD-10-CM | POA: Diagnosis not present

## 2021-04-13 DIAGNOSIS — I4891 Unspecified atrial fibrillation: Secondary | ICD-10-CM | POA: Diagnosis not present

## 2021-04-26 ENCOUNTER — Other Ambulatory Visit: Payer: Self-pay | Admitting: *Deleted

## 2021-04-26 NOTE — Patient Outreach (Signed)
[facility] [facility]) Care Management  04/26/2021  Molly Neal January 12, 1932 [id number]  Outreach attempt to patient. No answer and unable to leave voicemail message due to phone rang 12 times without an answer; unable to leave VM.  Plan: RN Health Coach will call patient within the month of January.  [name] RN, BSN [place] [phone] [email or web address]

## 2021-05-04 DIAGNOSIS — I480 Paroxysmal atrial fibrillation: Secondary | ICD-10-CM | POA: Diagnosis not present

## 2021-05-04 DIAGNOSIS — F325 Major depressive disorder, single episode, in full remission: Secondary | ICD-10-CM | POA: Diagnosis not present

## 2021-05-04 DIAGNOSIS — I10 Essential (primary) hypertension: Secondary | ICD-10-CM | POA: Diagnosis not present

## 2021-05-04 DIAGNOSIS — M1712 Unilateral primary osteoarthritis, left knee: Secondary | ICD-10-CM | POA: Diagnosis not present

## 2021-05-04 DIAGNOSIS — M170 Bilateral primary osteoarthritis of knee: Secondary | ICD-10-CM | POA: Diagnosis not present

## 2021-05-30 IMAGING — DX DG PORTABLE PELVIS
1 series · 1 of 1 positions shown · non-contrast
Comparison: CT 10/24/2006

CLINICAL DATA: Trauma fall on blood thinners

EXAM:
PORTABLE PELVIS 1-2 VIEWS

[pelvis ap]
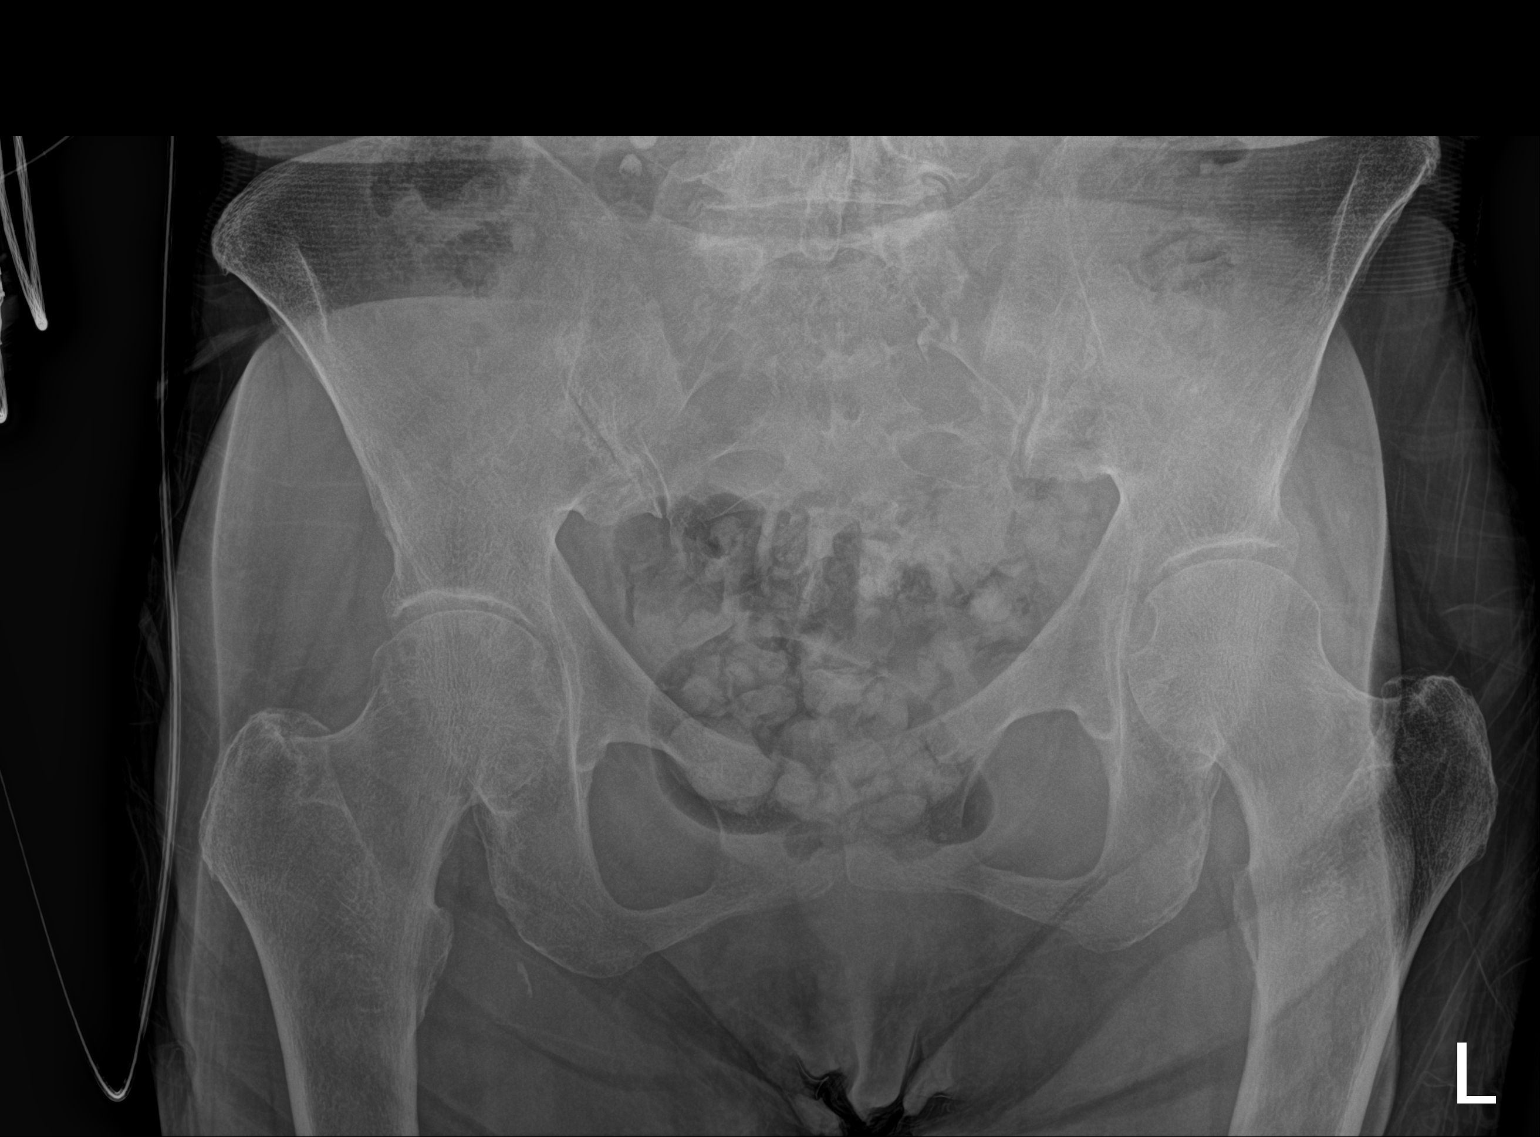

[1 of 1 positions shown; findings below may reference images not displayed]

FINDINGS: SI joints are non widened. Pubic symphysis and rami appear intact.
No fracture or dislocation.
IMPRESSION: No acute osseous abnormality

## 2021-06-06 DIAGNOSIS — I4891 Unspecified atrial fibrillation: Secondary | ICD-10-CM | POA: Diagnosis not present

## 2021-06-06 DIAGNOSIS — F325 Major depressive disorder, single episode, in full remission: Secondary | ICD-10-CM | POA: Diagnosis not present

## 2021-06-06 DIAGNOSIS — I480 Paroxysmal atrial fibrillation: Secondary | ICD-10-CM | POA: Diagnosis not present

## 2021-06-06 DIAGNOSIS — I10 Essential (primary) hypertension: Secondary | ICD-10-CM | POA: Diagnosis not present

## 2021-06-06 DIAGNOSIS — M170 Bilateral primary osteoarthritis of knee: Secondary | ICD-10-CM | POA: Diagnosis not present

## 2021-06-06 DIAGNOSIS — M1712 Unilateral primary osteoarthritis, left knee: Secondary | ICD-10-CM | POA: Diagnosis not present

## 2021-06-14 ENCOUNTER — Other Ambulatory Visit: Payer: Self-pay | Admitting: *Deleted

## 2021-06-14 NOTE — Patient Outreach (Signed)
[place] [facility]) Care Management  06/14/2021  Molly Neal [birth year] [id number]  Unsuccessful outreach attempt made to patient. Patient answered the phone and stated that she would not be able to speak today. Patient did request that this nurse call back at a later date.   Plan: RN Health Coach will call patient within the month of February.  [name] RN, BSN [place] [phone] [email or web address]

## 2021-06-22 ENCOUNTER — Other Ambulatory Visit: Payer: Self-pay | Admitting: *Deleted

## 2021-06-22 NOTE — Patient Outreach (Signed)
[place] [facility]) Care Management  06/22/2021  OVA GILLENTINE [birth year] [id number]  Unsuccessful outreach attempt made to patient. RN Health Coach left HIPAA compliant voicemail message along with her contact information.  Plan: RN Health Coach will call patient within the month of March.  [name] RN, BSN [place] [phone] [email or web address]

## 2021-07-05 DIAGNOSIS — L309 Dermatitis, unspecified: Secondary | ICD-10-CM | POA: Diagnosis not present

## 2021-07-05 DIAGNOSIS — M170 Bilateral primary osteoarthritis of knee: Secondary | ICD-10-CM | POA: Diagnosis not present

## 2021-07-05 DIAGNOSIS — M48061 Spinal stenosis, lumbar region without neurogenic claudication: Secondary | ICD-10-CM | POA: Diagnosis not present

## 2021-07-05 DIAGNOSIS — R269 Unspecified abnormalities of gait and mobility: Secondary | ICD-10-CM | POA: Diagnosis not present

## 2021-07-05 DIAGNOSIS — M1712 Unilateral primary osteoarthritis, left knee: Secondary | ICD-10-CM | POA: Diagnosis not present

## 2021-07-12 DIAGNOSIS — I10 Essential (primary) hypertension: Secondary | ICD-10-CM | POA: Diagnosis not present

## 2021-07-12 DIAGNOSIS — I480 Paroxysmal atrial fibrillation: Secondary | ICD-10-CM | POA: Diagnosis not present

## 2021-07-18 ENCOUNTER — Other Ambulatory Visit: Payer: Self-pay | Admitting: *Deleted

## 2021-07-18 NOTE — Patient Outreach (Signed)
[name] Care Management ? ?07/18/2021 ? ?Molly Neal ?26-Mar-1932 ?[id number] ? ?Unsuccessful outreach attempt made to patient. RN Health Coach left HIPAA compliant voicemail message along with her contact information. ? ?Plan: ?RN Health Coach will call patient within the month of April. ? ?[name] RN, BSN ?[facility] [address] Care Management  ?RN Health Coach ?[phone] ?[email or web address] ? ? ?

## 2021-08-06 DIAGNOSIS — G8929 Other chronic pain: Secondary | ICD-10-CM | POA: Diagnosis not present

## 2021-08-06 DIAGNOSIS — I10 Essential (primary) hypertension: Secondary | ICD-10-CM | POA: Diagnosis not present

## 2021-08-06 DIAGNOSIS — F419 Anxiety disorder, unspecified: Secondary | ICD-10-CM | POA: Diagnosis not present

## 2021-08-06 DIAGNOSIS — I480 Paroxysmal atrial fibrillation: Secondary | ICD-10-CM | POA: Diagnosis not present

## 2021-08-07 DIAGNOSIS — I10 Essential (primary) hypertension: Secondary | ICD-10-CM | POA: Diagnosis not present

## 2021-08-07 DIAGNOSIS — D6869 Other thrombophilia: Secondary | ICD-10-CM | POA: Diagnosis not present

## 2021-08-07 DIAGNOSIS — M170 Bilateral primary osteoarthritis of knee: Secondary | ICD-10-CM | POA: Diagnosis not present

## 2021-08-07 DIAGNOSIS — N1831 Chronic kidney disease, stage 3a: Secondary | ICD-10-CM | POA: Diagnosis not present

## 2021-08-07 DIAGNOSIS — E46 Unspecified protein-calorie malnutrition: Secondary | ICD-10-CM | POA: Diagnosis not present

## 2021-08-07 DIAGNOSIS — I480 Paroxysmal atrial fibrillation: Secondary | ICD-10-CM | POA: Diagnosis not present

## 2021-08-07 DIAGNOSIS — Z79899 Other long term (current) drug therapy: Secondary | ICD-10-CM | POA: Diagnosis not present

## 2021-08-07 DIAGNOSIS — I129 Hypertensive chronic kidney disease with stage 1 through stage 4 chronic kidney disease, or unspecified chronic kidney disease: Secondary | ICD-10-CM | POA: Diagnosis not present

## 2021-08-17 ENCOUNTER — Other Ambulatory Visit: Payer: Self-pay | Admitting: *Deleted

## 2021-08-17 NOTE — Patient Outreach (Signed)
[place] [facility]) Care Management ? ?08/17/2021 ? ?Lizbeth Bark ?[birth year] ?[id number] ? ?Dr. Felipa Eth has a Nurse Care Manager in the office that will follow up with the patient's care.  ? ?Plan: ?RN Health Coach will close case and will send patient a case closed letter.  ? ?[name] RN, BSN ?[facility] Care Management  ?RN Health Coach ?[phone] ?[email or web address] ? ? ? ?

## 2021-08-21 ENCOUNTER — Ambulatory Visit: Payer: Medicare Other | Admitting: *Deleted

## 2021-09-27 ENCOUNTER — Other Ambulatory Visit: Payer: Self-pay

## 2021-09-27 MED ORDER — APIXABAN 2.5 MG PO TABS: 2.5000 mg | ORAL_TABLET | Freq: Two times a day (BID) | ORAL | 1 refills | Status: DC

## 2021-09-27 NOTE — Telephone Encounter (Signed)
Prescription refill request for Eliquis received. ?Indication: Atrial Fib ?Last office visit: 07/21/20  Shary Key MD ?Scr: 1.09 on 08/07/21 ?Age: 88 ?Weight: 51.5kg ? ?Based on above findings Eliquis 2.'5mg'$  twice daily is the appropriate dose.  Refill approved 2. ?Pt is PAST DUE to see MD.  Message sent to Schedulers to make appt. ? ?

## 2021-11-29 ENCOUNTER — Telehealth: Payer: Self-pay | Admitting: *Deleted

## 2021-11-29 NOTE — Telephone Encounter (Addendum)
[facility]   Indication: afib  Last office visit: 07/21/2020, [place] Scr:  1.09, 08/07/2021 Age: 88 yo  Weight: 51.1 kg   Pt is on the correct dose of Eliquis.  However, pt is overdue to see cardiologist.   Attempted to call pt but not answer. Sent msg to the scheduler to make an appointment.

## 2021-11-30 MED ORDER — APIXABAN 2.5 MG PO TABS: 2.5000 mg | ORAL_TABLET | Freq: Two times a day (BID) | ORAL | 0 refills | Status: DC

## 2021-11-30 NOTE — Telephone Encounter (Signed)
Sent in a refill for a 1 weeks supply. With msg for future refills please schedule appointment with cardiologist.

## 2021-12-05 ENCOUNTER — Encounter: Payer: Self-pay | Admitting: Cardiology

## 2021-12-05 ENCOUNTER — Telehealth: Payer: Self-pay | Admitting: Cardiology

## 2021-12-05 NOTE — Telephone Encounter (Signed)
Called patient 3x to schedule overdue appt with Dr. Johney Frame, unable to reach patient or LVM. Will send reminder letter.

## 2022-01-02 DIAGNOSIS — M170 Bilateral primary osteoarthritis of knee: Secondary | ICD-10-CM | POA: Diagnosis not present

## 2022-01-02 DIAGNOSIS — F325 Major depressive disorder, single episode, in full remission: Secondary | ICD-10-CM | POA: Diagnosis not present

## 2022-01-02 DIAGNOSIS — N1831 Chronic kidney disease, stage 3a: Secondary | ICD-10-CM | POA: Diagnosis not present

## 2022-01-02 DIAGNOSIS — I129 Hypertensive chronic kidney disease with stage 1 through stage 4 chronic kidney disease, or unspecified chronic kidney disease: Secondary | ICD-10-CM | POA: Diagnosis not present

## 2022-01-02 DIAGNOSIS — I480 Paroxysmal atrial fibrillation: Secondary | ICD-10-CM | POA: Diagnosis not present

## 2022-01-04 DIAGNOSIS — I480 Paroxysmal atrial fibrillation: Secondary | ICD-10-CM | POA: Diagnosis not present

## 2022-01-04 DIAGNOSIS — M7989 Other specified soft tissue disorders: Secondary | ICD-10-CM | POA: Diagnosis not present

## 2022-01-04 DIAGNOSIS — N1831 Chronic kidney disease, stage 3a: Secondary | ICD-10-CM | POA: Diagnosis not present

## 2022-01-04 DIAGNOSIS — E440 Moderate protein-calorie malnutrition: Secondary | ICD-10-CM | POA: Diagnosis not present

## 2022-01-04 DIAGNOSIS — L239 Allergic contact dermatitis, unspecified cause: Secondary | ICD-10-CM | POA: Diagnosis not present

## 2022-01-04 DIAGNOSIS — R634 Abnormal weight loss: Secondary | ICD-10-CM | POA: Diagnosis not present

## 2022-01-04 DIAGNOSIS — M170 Bilateral primary osteoarthritis of knee: Secondary | ICD-10-CM | POA: Diagnosis not present

## 2022-01-04 DIAGNOSIS — R350 Frequency of micturition: Secondary | ICD-10-CM | POA: Diagnosis not present

## 2022-01-10 ENCOUNTER — Other Ambulatory Visit: Payer: Self-pay

## 2022-01-10 MED ORDER — APIXABAN 2.5 MG PO TABS: 2.5000 mg | ORAL_TABLET | Freq: Two times a day (BID) | ORAL | 0 refills | Status: DC

## 2022-01-10 NOTE — Telephone Encounter (Addendum)
Prescription refill request for Eliquis received. Indication: Afib  Last office visit: 07/21/20 Molly Neal) Scr: 1.00 (01/04/22 via [place])  Age: 88 Weight: 51.1kg   Pt overdue to see provider. Pt has scheduled appt with [place] on 01/31/22. Called pt and made her aware that a small refill will be sent to requested pharmacy; however, she will need to see provider for future refills. Pt verbalized understanding.

## 2022-01-23 NOTE — Progress Notes (Deleted)
Cardiology Office Note:    Date:  01/23/2022   ID:  Molly Neal, DOB 11/04/1931, MRN [id number]  PCP:  Lajean Manes, MD   [facility] Providers Cardiologist:  None { Click to update primary MD,subspecialty MD or APP then REFRESH:1}    Referring MD: Lajean Manes, MD   Chief Complaint: ***  History of Present Illness:    Molly Neal is a *** 88 y.o. female with a hx of HTN, mild AI, moderate MR, HLD, CKD, depression and atrial fibrillation.  Referred to cardiology by PCP for further management of atrial fibrillation.  Was initially diagnosed with A-fib 02/22/2020 at her PCP office incidentally during a routine visit.  She was started on Eliquis for CHA2DS2-VASc score of 4 and her metoprolol was increased.  Echocardiogram showed preserved EF 60 to 65%, moderate MR, mild AI.  She has been followed by Adline Peals, PA in [facility].   She was seen in our office by Dr. Johney Frame on 07/21/2020. It is noted that she will decline further interventions to try to restore sinus rhythm. BP was elevated. She was given cuff for home monitoring and advised to return in 6 months for follow-up.  Today, she is here   Past Medical History:  Diagnosis Date   Anxiety    Aortic insufficiency    Arthritis    Chronic kidney disease (CKD)    Depression    Diverticulosis    GERD (gastroesophageal reflux disease)    Hypercholesterolemia    Hypertension    Iron deficiency anemia    Mitral regurgitation     No past surgical history on file.  Current Medications: No outpatient medications have been marked as taking for the 01/31/22 encounter (Appointment) with Ann Maki, Lanice Schwab, NP.     Allergies:   Aspirin and Pravastatin   Social History   Socioeconomic History   Marital status: Widowed    Spouse name: Not on file   Number of children: 2   Years of education: 12   Highest education level: High school graduate  Occupational History   Occupation: Retired  Tobacco Use    Smoking status: Former    Packs/day: 0.25    Years: 10.00    Total pack years: 2.50    Types: Cigarettes   Smokeless tobacco: Never   Tobacco comments:    She smoked off and on  Vaping Use   Vaping Use: Not on file  Substance and Sexual Activity   Alcohol use: No   Drug use: No   Sexual activity: Not Currently  Other Topics Concern   Not on file  Social History Narrative   Not on file   Social Determinants of Health   Financial Resource Strain: Low Risk  (07/24/2019)   Overall Financial Resource Strain (CARDIA)    Difficulty of Paying Living Expenses: Not very hard  Food Insecurity: No Food Insecurity (07/24/2019)   Hunger Vital Sign    Worried About Running Out of Food in the Last Year: Never true    Ran Out of Food in the Last Year: Never true  Transportation Needs: No Transportation Needs (07/24/2019)   PRAPARE - Hydrologist (Medical): No    Lack of Transportation (Non-Medical): No  Physical Activity: Inactive (07/24/2019)   Exercise Vital Sign    Days of Exercise per Week: 0 days    Minutes of Exercise per Session: 0 min  Stress: No Stress Concern Present (07/24/2019)   [place]  [employer]stitute of Occupational Health - Occupational Stress Questionnaire    Feeling of Stress : Only a little  Social Connections: Moderately Integrated (07/24/2019)   Social Connection and Isolation Panel [NHANES]    Frequency of Communication with Friends and Family: More than three times a week    Frequency of Social Gatherings with Friends and Family: Once a week    Attends Religious Services: More than 4 times per year    Active Member of [employer] or Organizations: Yes    Attends Archivist Meetings: More than 4 times per year    Marital Status: Widowed     Family History: The patient's ***family history is not on file.  ROS:   Please see the history of present illness.    *** All other systems reviewed and are negative.  Labs/Other Studies Reviewed:     The following studies were reviewed today: ***  Recent Labs: No results found for requested labs within last 365 days.  Recent Lipid Panel No results found for: "CHOL", "TRIG", "HDL", "CHOLHDL", "VLDL", "LDLCALC", "LDLDIRECT"   Risk Assessment/Calculations:   {Does this patient have ATRIAL FIBRILLATION?:[phone]}       Physical Exam:    VS:  There were no vitals taken for this visit.    Wt Readings from Last 3 Encounters:  07/21/20 112 lb 9.6 oz (51.1 kg)  04/11/20 115 lb 6.4 oz (52.3 kg)  03/21/20 114 lb 6.4 oz (51.9 kg)     GEN: *** Well nourished, well developed in no acute distress HEENT: Normal NECK: No JVD; No carotid bruits CARDIAC: ***RRR, no murmurs, rubs, gallops RESPIRATORY:  Clear to auscultation without rales, wheezing or rhonchi  ABDOMEN: Soft, non-tender, non-distended MUSCULOSKELETAL:  No edema; No deformity. *** pedal pulses, ***bilaterally SKIN: Warm and dry NEUROLOGIC:  Alert and oriented x 3 PSYCHIATRIC:  Normal affect   EKG:  EKG is *** ordered today.  The ekg ordered today demonstrates ***  No BP recorded.  {Refresh Note OR Click here to enter BP  :1}***    Diagnoses:    1. Permanent atrial fibrillation ([place])   2. Secondary hypercoagulable state ([place])   3. Primary hypertension    Assessment and Plan:     Permanent atrial fibrillation on chronic anticoagulation: Hypertension:  {Are you ordering a CV Procedure (e.g. stress test, cath, DCCV, TEE, etc)?   Press F2        :[id number]}   Disposition:  Medication Adjustments/Labs and Tests Ordered: Current medicines are reviewed at length with the patient today.  Concerns regarding medicines are outlined above.  No orders of the defined types were placed in this encounter.  No orders of the defined types were placed in this encounter.   There are no Patient Instructions on file for this visit.   Signed, Emmaline Life, NP  01/23/2022 2:12 PM    [place]

## 2022-01-31 ENCOUNTER — Ambulatory Visit: Payer: Medicare Other | Admitting: Nurse Practitioner

## 2022-01-31 ENCOUNTER — Other Ambulatory Visit: Payer: Self-pay

## 2022-01-31 DIAGNOSIS — I10 Essential (primary) hypertension: Secondary | ICD-10-CM

## 2022-01-31 DIAGNOSIS — I4819 Other persistent atrial fibrillation: Secondary | ICD-10-CM

## 2022-01-31 DIAGNOSIS — I4821 Permanent atrial fibrillation: Secondary | ICD-10-CM

## 2022-01-31 DIAGNOSIS — D6869 Other thrombophilia: Secondary | ICD-10-CM

## 2022-01-31 MED ORDER — APIXABAN 2.5 MG PO TABS: 2.5000 mg | ORAL_TABLET | Freq: Two times a day (BID) | ORAL | 0 refills | Status: DC

## 2022-01-31 NOTE — Telephone Encounter (Signed)
Prescription refill request for Eliquis received. Indication:Afib  Last office visit:07/21/20 Molly Neal) Scr: 1.00 (01/04/22 via [place]) Age: 88 Weight: 51.1kg  Pt overdue to see provider. Pt had appt today with Molly Neal; however she did not show up. Called pt and made her aware she missed her appt. Pt stated she did not have transportation today and needs to reschedule. Transferred pt to schedulers to make appt and sent a 30 day supply to pharmacy to last until future appt.

## 2022-02-01 DIAGNOSIS — I480 Paroxysmal atrial fibrillation: Secondary | ICD-10-CM | POA: Diagnosis not present

## 2022-02-01 DIAGNOSIS — N1831 Chronic kidney disease, stage 3a: Secondary | ICD-10-CM | POA: Diagnosis not present

## 2022-02-01 DIAGNOSIS — I129 Hypertensive chronic kidney disease with stage 1 through stage 4 chronic kidney disease, or unspecified chronic kidney disease: Secondary | ICD-10-CM | POA: Diagnosis not present

## 2022-02-01 DIAGNOSIS — F325 Major depressive disorder, single episode, in full remission: Secondary | ICD-10-CM | POA: Diagnosis not present

## 2022-02-01 DIAGNOSIS — M170 Bilateral primary osteoarthritis of knee: Secondary | ICD-10-CM | POA: Diagnosis not present

## 2022-02-17 NOTE — Progress Notes (Unsigned)
Office Visit    Patient Name: Molly Neal Date of Encounter: 02/18/2022  PCP:  No primary care provider on file.   [facility]  Cardiologist:  Freada Bergeron, MD  Advanced Practice Provider:  No care team member to display Electrophysiologist:  None   HPI    Molly Neal is a 88 y.o. female with a past medical history significant for hypertension, mild AI, moderate MR, HLD, CKD, depression, and atrial fibrillation presents today for follow-up visit.  She was initially diagnosed with atrial fibrillation 02/22/2020 at her primary care office.  She was referred to cardiology for further treatment.  She was started on Eliquis for CHA2DS2-VASc score of 4 and her metoprolol was increased.  Echocardiogram showed preserved EF 66 5%, moderate MR, mild AI.  Has been followed by Malka So at the A-fib clinic.  The patient was last seen by Dr. Johney Frame on 07/2020 and was feeling well at that time.  She was having a lot of orthopedic issues.  No chest pain, shortness of breath, lightheadedness, dizziness.  Occasional palpitations but not bothersome.  No bleeding issues on apixaban.  Blood pressure was elevated at that time.  Today, she states that she has been doing pretty well.  She is in A-fib with RVR rate of 136 bpm today.  She has had atrial fibrillation for a year plus and her last EKG her rate was 113.  She has not taken her metoprolol succinate today or her Norvasc.  I discussed her EKG with the DOD.  We have administered 25 mg of metoprolol tartrate here in the clinic.  She has female ordered medications so she should get her increased dose of metoprolol succinate tomorrow.  We have increased her to 75 mg of metoprolol succinate.  We discussed cardioversion and she remains on the fence.  I have encouraged her to not miss a dose of Eliquis.   Reports no shortness of breath nor dyspnea on exertion. Reports no chest pain, pressure, or tightness. No edema,  orthopnea, PND. Reports no palpitations.   Past Medical History    Past Medical History:  Diagnosis Date   Anxiety    Aortic insufficiency    Arthritis    Chronic kidney disease (CKD)    Depression    Diverticulosis    GERD (gastroesophageal reflux disease)    Hypercholesterolemia    Hypertension    Iron deficiency anemia    Mitral regurgitation    History reviewed. No pertinent surgical history.  Allergies  Allergies  Allergen Reactions   Aspirin Swelling   Pravastatin     EKGs/Labs/Other Studies Reviewed:   The following studies were reviewed today: This is okay TTE 03/21/20: 1. Left ventricular ejection fraction, by estimation, is 60 to 65%. The  left ventricle has normal function. The left ventricle has no regional  wall motion abnormalities. Left ventricular diastolic function could not  be evaluated.   2. Right ventricular systolic function is normal. The right ventricular  size is normal. There is moderately elevated pulmonary artery systolic  pressure.   3. Left atrial size was mildly dilated.   4. The mitral valve is normal in structure. Moderate mitral valve  regurgitation.   5. Tricuspid valve regurgitation is moderate.   6. The aortic valve is normal in structure. Aortic valve regurgitation is  mild. No aortic stenosis is present.  EKG:  EKG is  ordered today.  The ekg ordered today demonstrates atrial fibrillation with RVR, rate  136 bpm  Recent Labs: No results found for requested labs within last 365 days.  Recent Lipid Panel No results found for: "CHOL", "TRIG", "HDL", "CHOLHDL", "VLDL", "LDLCALC", "LDLDIRECT"  Risk Assessment/Calculations:   CHA2DS2-VASc Score =     This indicates a  % annual risk of stroke. The patient's score is based upon:      Home Medications   Current Meds  Medication Sig   amLODipine (NORVASC) 5 MG tablet Take 5 mg by mouth every other day.   apixaban (ELIQUIS) 2.5 MG TABS tablet Take 1 tablet (2.5 mg total) by  mouth 2 (two) times daily. Must come to upcoming appt for future refills.   Ascorbic Acid (VITAMIN C PO) Take 1,000 mg by mouth daily.   busPIRone (BUSPAR) 5 MG tablet Take 5 mg by mouth 2 (two) times daily.   cetirizine (ZYRTEC) 10 MG tablet Take 10 mg by mouth every morning.   Cholecalciferol (VITAMIN D3) 25 MCG (1000 UT) CAPS Take 1 capsule by mouth every morning.   FEROSUL 325 (65 Fe) MG tablet Take 325 mg by mouth daily.    HYDROcodone-acetaminophen (NORCO/VICODIN) 5-325 MG per tablet Take by mouth every 8 (eight) hours as needed. For 30 days   LORazepam (ATIVAN) 0.5 MG tablet Take 0.5 mg by mouth 2 (two) times daily as needed.   metoprolol succinate (TOPROL XL) 25 MG 24 hr tablet Take 1 tablet (25 mg total) by mouth daily. Take along with the 50 mg tablet to equal 75 mg daily   NON FORMULARY SMARTSIG:1 Tablet(s) By Mouth Twice Daily   sertraline (ZOLOFT) 50 MG tablet Take 50 mg by mouth daily.    SHINGRIX injection    traMADol (ULTRAM) 50 MG tablet Take 1 tablet (50 mg total) by mouth every 6 (six) hours as needed.   triamcinolone (KENALOG) 0.1 % SMARTSIG:Sparingly Topical Twice Daily   [DISCONTINUED] metoprolol succinate (TOPROL-XL) 50 MG 24 hr tablet Take 50 mg by mouth daily.     Review of Systems      All other systems reviewed and are otherwise negative except as noted above.  Physical Exam    VS:  BP 134/68   Pulse (!) 136   Ht '5\' 2"'$  (1.575 m)   SpO2 95%   BMI 20.59 kg/m  , BMI Body mass index is 20.59 kg/m.  Wt Readings from Last 3 Encounters:  07/21/20 112 lb 9.6 oz (51.1 kg)  04/11/20 115 lb 6.4 oz (52.3 kg)  03/21/20 114 lb 6.4 oz (51.9 kg)     GEN: Well nourished, well developed, in no acute distress. HEENT: normal. Neck: Supple, no JVD, carotid bruits, or masses. Cardiac: irregularly irregular, no murmurs, rubs, or gallops. No clubbing, cyanosis, edema.  Radials/PT 2+ and equal bilaterally.  Respiratory:  Respirations regular and unlabored, clear to  auscultation bilaterally. GI: Soft, nontender, nondistended. MS: No deformity or atrophy. Skin: Warm and dry, no rash. Neuro:  Strength and sensation are intact. Psych: Normal affect.  Assessment & Plan    Permanent atrial fibrillation -Atrial fibrillation with RVR today, rate 136 bpm -We administered metoprolol tartrate 25 mg in the clinic -We increased her metoprolol succinate to 75 mg daily -Offered DCCV and patient would like to think if this is what she wants to do -Emphasized continuing Eliquis 2.5 mg twice daily and not missing any doses  Secondary hypercoagulable state -No bleeding on Eliquis  Primary hypertension -BP well controlled today -continue current medication regimen  Moderate mitral regurgitation -Asymptomatic -Will need  updated Echo next visit         Disposition: Follow up 2 weeks with Freada Bergeron, MD or APP.  Signed, Elgie Collard, PA-C 02/18/2022, 4:43 PM [facility]

## 2022-02-18 ENCOUNTER — Encounter: Payer: Self-pay | Admitting: Physician Assistant

## 2022-02-18 ENCOUNTER — Ambulatory Visit: Payer: Medicare Other | Attending: Nurse Practitioner | Admitting: Physician Assistant

## 2022-02-18 VITALS — BP 134/68 | HR 136 | Ht 62.0 in

## 2022-02-18 DIAGNOSIS — I4821 Permanent atrial fibrillation: Secondary | ICD-10-CM | POA: Diagnosis not present

## 2022-02-18 DIAGNOSIS — I340 Nonrheumatic mitral (valve) insufficiency: Secondary | ICD-10-CM | POA: Insufficient documentation

## 2022-02-18 DIAGNOSIS — D6869 Other thrombophilia: Secondary | ICD-10-CM | POA: Insufficient documentation

## 2022-02-18 DIAGNOSIS — I10 Essential (primary) hypertension: Secondary | ICD-10-CM | POA: Diagnosis not present

## 2022-02-18 MED ORDER — METOPROLOL SUCCINATE ER 25 MG PO TB24: 25.0000 mg | ORAL_TABLET | Freq: Every day | ORAL | 3 refills | Status: DC

## 2022-02-18 MED ORDER — METOPROLOL TARTRATE 12.5 MG HALF TABLET
25.0000 mg | ORAL_TABLET | Freq: Once | ORAL | Status: AC
  Administered 2022-02-18: 25 mg via ORAL

## 2022-02-18 MED ORDER — METOPROLOL SUCCINATE ER 50 MG PO TB24: 50.0000 mg | ORAL_TABLET | Freq: Every day | ORAL | 3 refills | Status: DC

## 2022-02-18 NOTE — Patient Instructions (Addendum)
Medication Instructions:  1.Increase metoprolol succinate to 75 mg daily, you will take one tablet of the 25 mg and one tablet of the 50 mg to equal 75 mg daily. *If you need a refill on your cardiac medications before your next appointment, please call your pharmacy*  Lab Work: None ordered If you have labs (blood work) drawn today and your tests are completely normal, you will receive your results only by: [place] (if you have MyChart) OR A paper copy in the mail If you have any lab test that is abnormal or we need to change your treatment, we will call you to review the results.  Follow-Up: At [facility], you and your health needs are our priority.  As part of our continuing mission to provide you with exceptional heart care, we have created designated Provider Care Teams.  These Care Teams include your primary Cardiologist (physician) and Advanced Practice Providers (APPs -  Physician Assistants and Nurse Practitioners) who all work together to provide you with the care you need, when you need it.  We recommend signing up for the patient portal called "MyChart".  Sign up information is provided on this After Visit Summary.  MyChart is used to connect with patients for Virtual Visits (Telemedicine).  Patients are able to view lab/test results, encounter notes, upcoming appointments, etc.  Non-urgent messages can be sent to your provider as well.   To learn more about what you can do with MyChart, go to [email or web address].    Your next appointment:   2 week(s)  The format for your next appointment:   In Person  Provider:   Freada Bergeron, MD  or APP  Other Instructions Do not miss a single dose of Eliquis  Important Information About Sugar

## 2022-03-04 NOTE — Progress Notes (Unsigned)
Office Visit    Patient Name: Molly Neal Date of Encounter: 03/05/2022  PCP:  No primary care provider on file.   [facility]  Cardiologist:  Freada Bergeron, MD  Advanced Practice Provider:  No care team member to display Electrophysiologist:  None   HPI    Molly Neal is a 88 y.o. female with a past medical history significant for hypertension, mild AI, moderate MR, HLD, CKD, depression, and atrial fibrillation presents today for follow-up visit.  She was initially diagnosed with atrial fibrillation 02/22/2020 at her primary care office.  She was referred to cardiology for further treatment.  She was started on Eliquis for CHA2DS2-VASc score of 4 and her metoprolol was increased.  Echocardiogram showed preserved EF 66 5%, moderate MR, mild AI.  Has been followed by Malka So at the A-fib clinic.  The patient was last seen by Dr. Johney Frame on 07/2020 and was feeling well at that time.  She was having a lot of orthopedic issues.  No chest pain, shortness of breath, lightheadedness, dizziness.  Occasional palpitations but not bothersome.  No bleeding issues on apixaban.  Blood pressure was elevated at that time.  She was last seen by me 02/18/2022, at that time, she states that she has been doing pretty well.  She is in A-fib with RVR rate of 136 bpm today.  She has had atrial fibrillation for a year plus and her last EKG her rate was 113.  She has not taken her metoprolol succinate today or her Norvasc.  I discussed her EKG with the DOD.  We have administered 25 mg of metoprolol tartrate here in the clinic.  She has female ordered medications so she should get her increased dose of metoprolol succinate tomorrow.  We have increased her to 75 mg of metoprolol succinate.  We discussed cardioversion and she remains on the fence.  I have encouraged her to not miss a dose of Eliquis.   Today, she is feeling better.  She is in rate controlled atrial  fibrillation with a rate 83 bpm which is significantly improved from the 136 bpm she was when I last saw her.  Blood pressure is stable as well.  She is taking 75 mg of metoprolol succinate daily.  The pharmacy arranges her pills in pill packs so it makes it easier for her.  She has not missed a dose of Eliquis.  She is not interested in cardioversion at this time.  We did discuss updating an echocardiogram which she agreed to do.  Reports no shortness of breath nor dyspnea on exertion. Reports no chest pain, pressure, or tightness. No edema, orthopnea, PND. Reports no palpitations.    Past Medical History    Past Medical History:  Diagnosis Date   Anxiety    Aortic insufficiency    Arthritis    Chronic kidney disease (CKD)    Depression    Diverticulosis    GERD (gastroesophageal reflux disease)    Hypercholesterolemia    Hypertension    Iron deficiency anemia    Mitral regurgitation    No past surgical history on file.  Allergies  Allergies  Allergen Reactions   Aspirin Swelling   Pravastatin     EKGs/Labs/Other Studies Reviewed:   The following studies were reviewed today: This is okay TTE 03/21/20: 1. Left ventricular ejection fraction, by estimation, is 60 to 65%. The  left ventricle has normal function. The left ventricle has no regional  wall  motion abnormalities. Left ventricular diastolic function could not  be evaluated.   2. Right ventricular systolic function is normal. The right ventricular  size is normal. There is moderately elevated pulmonary artery systolic  pressure.   3. Left atrial size was mildly dilated.   4. The mitral valve is normal in structure. Moderate mitral valve  regurgitation.   5. Tricuspid valve regurgitation is moderate.   6. The aortic valve is normal in structure. Aortic valve regurgitation is  mild. No aortic stenosis is present.  EKG:  EKG is  ordered today.  The ekg ordered today demonstrates atrial fibrillation with RVR, rate  136 bpm  Recent Labs: No results found for requested labs within last 365 days.  Recent Lipid Panel No results found for: "CHOL", "TRIG", "HDL", "CHOLHDL", "VLDL", "LDLCALC", "LDLDIRECT"  Risk Assessment/Calculations:   CHA2DS2-VASc Score = 4   This indicates a 4.8% annual risk of stroke. The patient's score is based upon: CHF History: 0 HTN History: 1 Diabetes History: 0 Stroke History: 0 Vascular Disease History: 0 Age Score: 2 Gender Score: 1     Home Medications   Current Meds  Medication Sig   amLODipine (NORVASC) 5 MG tablet Take 5 mg by mouth every other day.   apixaban (ELIQUIS) 2.5 MG TABS tablet Take 1 tablet (2.5 mg total) by mouth 2 (two) times daily. Must come to upcoming appt for future refills.   Ascorbic Acid (VITAMIN C PO) Take 1,000 mg by mouth daily.   busPIRone (BUSPAR) 5 MG tablet Take 5 mg by mouth 2 (two) times daily.   cetirizine (ZYRTEC) 10 MG tablet Take 10 mg by mouth every morning.   Cholecalciferol (VITAMIN D3) 25 MCG (1000 UT) CAPS Take 1 capsule by mouth every morning.   FEROSUL 325 (65 Fe) MG tablet Take 325 mg by mouth daily.    HYDROcodone-acetaminophen (NORCO/VICODIN) 5-325 MG per tablet Take by mouth every 8 (eight) hours as needed. For 30 days   LORazepam (ATIVAN) 0.5 MG tablet Take 0.5 mg by mouth 2 (two) times daily as needed.   metoprolol succinate (TOPROL XL) 25 MG 24 hr tablet Take 1 tablet (25 mg total) by mouth daily. Take along with the 50 mg tablet to equal 75 mg daily   metoprolol succinate (TOPROL-XL) 50 MG 24 hr tablet Take 1 tablet (50 mg total) by mouth daily. Take along with the 25 mg tablet to equal 75 mg daily   NON FORMULARY SMARTSIG:1 Tablet(s) By Mouth Twice Daily   sertraline (ZOLOFT) 50 MG tablet Take 50 mg by mouth daily.    SHINGRIX injection    traMADol (ULTRAM) 50 MG tablet Take 1 tablet (50 mg total) by mouth every 6 (six) hours as needed.   triamcinolone (KENALOG) 0.1 % SMARTSIG:Sparingly Topical Twice Daily      Review of Systems      All other systems reviewed and are otherwise negative except as noted above.  Physical Exam    VS:  BP 122/70 (BP Location: Left Arm, Patient Position: Sitting, Cuff Size: Normal)   Pulse 83   Ht '5\' 2"'$  (1.575 m)   Wt 108 lb (49 kg)   SpO2 93%   BMI 19.75 kg/m  , BMI Body mass index is 19.75 kg/m.  Wt Readings from Last 3 Encounters:  03/05/22 108 lb (49 kg)  07/21/20 112 lb 9.6 oz (51.1 kg)  04/11/20 115 lb 6.4 oz (52.3 kg)     GEN: Well nourished, well developed, in no acute  distress. HEENT: normal. Neck: Supple, no JVD, carotid bruits, or masses. Cardiac: irregularly irregular, no murmurs, rubs, or gallops. No clubbing, cyanosis, edema.  Radials/PT 2+ and equal bilaterally.  Respiratory:  Respirations regular and unlabored, clear to auscultation bilaterally. GI: Soft, nontender, nondistended. MS: No deformity or atrophy. Skin: Warm and dry, no rash. Neuro:  Strength and sensation are intact. Psych: Normal affect.  Assessment & Plan    Permanent atrial fibrillation -Atrial fibrillation rate controlled today, rate 83 bpm -Continue metoprolol succinate to 75 mg daily -Offered DCCV and patient declined at this time -Emphasized continuing Eliquis 2.5 mg twice daily and not missing any doses  Secondary hypercoagulable state -No bleeding on Eliquis  Primary hypertension -BP well controlled today -continue current medication regimen  Moderate mitral regurgitation -Asymptomatic -Will updated Echo         Disposition: Follow up 5 months with Freada Bergeron, MD or APP.  Signed, Elgie Collard, PA-C 03/05/2022, 3:01 PM [facility]

## 2022-03-05 ENCOUNTER — Telehealth: Payer: Self-pay | Admitting: Physician Assistant

## 2022-03-05 ENCOUNTER — Ambulatory Visit: Payer: Medicare Other | Attending: Physician Assistant | Admitting: Physician Assistant

## 2022-03-05 ENCOUNTER — Encounter: Payer: Self-pay | Admitting: Physician Assistant

## 2022-03-05 VITALS — BP 122/70 | HR 83 | Ht 62.0 in | Wt 108.0 lb

## 2022-03-05 DIAGNOSIS — I4821 Permanent atrial fibrillation: Secondary | ICD-10-CM | POA: Insufficient documentation

## 2022-03-05 DIAGNOSIS — I340 Nonrheumatic mitral (valve) insufficiency: Secondary | ICD-10-CM | POA: Insufficient documentation

## 2022-03-05 DIAGNOSIS — D6869 Other thrombophilia: Secondary | ICD-10-CM | POA: Diagnosis not present

## 2022-03-05 DIAGNOSIS — I10 Essential (primary) hypertension: Secondary | ICD-10-CM | POA: Diagnosis not present

## 2022-03-05 NOTE — Patient Instructions (Signed)
Medication Instructions:  Your physician recommends that you continue on your current medications as directed. Please refer to the Current Medication list given to you today.  *If you need a refill on your cardiac medications before your next appointment, please call your pharmacy*   Lab Work: None If you have labs (blood work) drawn today and your tests are completely normal, you will receive your results only by: [place] (if you have MyChart) OR A paper copy in the mail If you have any lab test that is abnormal or we need to change your treatment, we will call you to review the results.   Testing/Procedures: Your physician has requested that you have an echocardiogram. Echocardiography is a painless test that uses sound waves to create images of your heart. It provides your doctor with information about the size and shape of your heart and how well your heart's chambers and valves are working. This procedure takes approximately one hour. There are no restrictions for this procedure. Please do NOT wear cologne, perfume, aftershave, or lotions (deodorant is allowed). Please arrive 15 minutes prior to your appointment time.    Follow-Up: At [facility], you and your health needs are our priority.  As part of our continuing mission to provide you with exceptional heart care, we have created designated Provider Care Teams.  These Care Teams include your primary Cardiologist (physician) and Advanced Practice Providers (APPs -  Physician Assistants and Nurse Practitioners) who all work together to provide you with the care you need, when you need it.  We recommend signing up for the patient portal called "MyChart".  Sign up information is provided on this After Visit Summary.  MyChart is used to connect with patients for Virtual Visits (Telemedicine).  Patients are able to view lab/test results, encounter notes, upcoming appointments, etc.  Non-urgent messages can be sent to your  provider as well.   To learn more about what you can do with MyChart, go to [email or web address].    Your next appointment:   5 month(s)  The format for your next appointment:   In Person  Provider:   Freada Bergeron, MD    Important Information About Sugar

## 2022-03-05 NOTE — Telephone Encounter (Signed)
Ronny Bacon is calling wanting to know if the patient mentioned that she takes iron at her appt today. Please advise.

## 2022-03-11 ENCOUNTER — Other Ambulatory Visit: Payer: Self-pay

## 2022-03-11 DIAGNOSIS — I129 Hypertensive chronic kidney disease with stage 1 through stage 4 chronic kidney disease, or unspecified chronic kidney disease: Secondary | ICD-10-CM | POA: Diagnosis not present

## 2022-03-11 DIAGNOSIS — I4819 Other persistent atrial fibrillation: Secondary | ICD-10-CM

## 2022-03-11 DIAGNOSIS — N1831 Chronic kidney disease, stage 3a: Secondary | ICD-10-CM | POA: Diagnosis not present

## 2022-03-11 DIAGNOSIS — I480 Paroxysmal atrial fibrillation: Secondary | ICD-10-CM | POA: Diagnosis not present

## 2022-03-11 DIAGNOSIS — M170 Bilateral primary osteoarthritis of knee: Secondary | ICD-10-CM | POA: Diagnosis not present

## 2022-03-11 DIAGNOSIS — M1712 Unilateral primary osteoarthritis, left knee: Secondary | ICD-10-CM | POA: Diagnosis not present

## 2022-03-11 DIAGNOSIS — F325 Major depressive disorder, single episode, in full remission: Secondary | ICD-10-CM | POA: Diagnosis not present

## 2022-03-11 MED ORDER — APIXABAN 2.5 MG PO TABS: 2.5000 mg | ORAL_TABLET | Freq: Two times a day (BID) | ORAL | 1 refills | Status: DC

## 2022-03-11 NOTE — Telephone Encounter (Signed)
Prescription refill request for Eliquis received. Indication: Afib  Last office visit: 03/07/22 Molly Neal)  Scr: 1.0 (01/04/22 via [place])  Age: 88 Weight: 49kg  Appropriate dose and refill sent to requested pharmacy.

## 2022-03-12 ENCOUNTER — Telehealth: Payer: Self-pay | Admitting: Cardiology

## 2022-03-12 NOTE — Telephone Encounter (Signed)
Spoke to [place]. Calling to inquire as to how much Toprol pt is taking. Informed that per last OV note, from last week, pt is taking Toprol 75 mg daily. She appreciates the information and they will update pts chart to reflect this.

## 2022-03-12 NOTE — Telephone Encounter (Signed)
Ronny Bacon from [facility] is calling verify info on medication and needs a verbal

## 2022-03-22 ENCOUNTER — Ambulatory Visit (HOSPITAL_COMMUNITY): Payer: Medicare Other | Attending: Physician Assistant

## 2022-03-22 DIAGNOSIS — I340 Nonrheumatic mitral (valve) insufficiency: Secondary | ICD-10-CM | POA: Diagnosis not present

## 2022-03-22 LAB — ECHOCARDIOGRAM COMPLETE
Calc EF: 66.3 %
MV M vel: 5.14 m/s
MV Peak grad: 105.8 mmHg
P 1/2 time: 489 msec
Radius: 0.5 cm
S' Lateral: 2.4 cm
Single Plane A2C EF: 68.5 %
Single Plane A4C EF: 64.3 %

## 2022-04-01 DIAGNOSIS — I129 Hypertensive chronic kidney disease with stage 1 through stage 4 chronic kidney disease, or unspecified chronic kidney disease: Secondary | ICD-10-CM | POA: Diagnosis not present

## 2022-04-01 DIAGNOSIS — F325 Major depressive disorder, single episode, in full remission: Secondary | ICD-10-CM | POA: Diagnosis not present

## 2022-04-01 DIAGNOSIS — N1831 Chronic kidney disease, stage 3a: Secondary | ICD-10-CM | POA: Diagnosis not present

## 2022-04-01 DIAGNOSIS — I480 Paroxysmal atrial fibrillation: Secondary | ICD-10-CM | POA: Diagnosis not present

## 2022-04-01 DIAGNOSIS — M170 Bilateral primary osteoarthritis of knee: Secondary | ICD-10-CM | POA: Diagnosis not present

## 2022-05-01 DIAGNOSIS — I129 Hypertensive chronic kidney disease with stage 1 through stage 4 chronic kidney disease, or unspecified chronic kidney disease: Secondary | ICD-10-CM | POA: Diagnosis not present

## 2022-05-01 DIAGNOSIS — I480 Paroxysmal atrial fibrillation: Secondary | ICD-10-CM | POA: Diagnosis not present

## 2022-05-01 DIAGNOSIS — N1831 Chronic kidney disease, stage 3a: Secondary | ICD-10-CM | POA: Diagnosis not present

## 2022-05-01 DIAGNOSIS — F325 Major depressive disorder, single episode, in full remission: Secondary | ICD-10-CM | POA: Diagnosis not present

## 2022-05-01 DIAGNOSIS — M170 Bilateral primary osteoarthritis of knee: Secondary | ICD-10-CM | POA: Diagnosis not present

## 2022-06-17 DIAGNOSIS — F325 Major depressive disorder, single episode, in full remission: Secondary | ICD-10-CM | POA: Diagnosis not present

## 2022-06-17 DIAGNOSIS — I129 Hypertensive chronic kidney disease with stage 1 through stage 4 chronic kidney disease, or unspecified chronic kidney disease: Secondary | ICD-10-CM | POA: Diagnosis not present

## 2022-06-17 DIAGNOSIS — N1831 Chronic kidney disease, stage 3a: Secondary | ICD-10-CM | POA: Diagnosis not present

## 2022-06-17 DIAGNOSIS — M170 Bilateral primary osteoarthritis of knee: Secondary | ICD-10-CM | POA: Diagnosis not present

## 2022-06-17 DIAGNOSIS — I480 Paroxysmal atrial fibrillation: Secondary | ICD-10-CM | POA: Diagnosis not present

## 2022-06-20 DIAGNOSIS — I129 Hypertensive chronic kidney disease with stage 1 through stage 4 chronic kidney disease, or unspecified chronic kidney disease: Secondary | ICD-10-CM | POA: Diagnosis not present

## 2022-06-20 DIAGNOSIS — M170 Bilateral primary osteoarthritis of knee: Secondary | ICD-10-CM | POA: Diagnosis not present

## 2022-06-20 DIAGNOSIS — N1831 Chronic kidney disease, stage 3a: Secondary | ICD-10-CM | POA: Diagnosis not present

## 2022-06-20 DIAGNOSIS — F325 Major depressive disorder, single episode, in full remission: Secondary | ICD-10-CM | POA: Diagnosis not present

## 2022-06-20 DIAGNOSIS — I480 Paroxysmal atrial fibrillation: Secondary | ICD-10-CM | POA: Diagnosis not present

## 2022-08-03 NOTE — Progress Notes (Deleted)
Cardiology Office Note:    Date:  08/03/2022   ID:  Molly Neal, DOB 18-Nov-1931, MRN [id number]  PCP:  No primary care provider on file.   [place]  Cardiologist:  Freada Bergeron, MD  Advanced Practice Provider:  No care team member to display Electrophysiologist:  None   Referring MD: No ref. provider found     History of Present Illness:    Molly Neal is a 89 y.o. female with a hx of HTN, mild AI, moderate MR, HLD, CKD, depression, and atrial fibrillation who was referred by Dr. Felipa Eth for further management of her atrial fibrillation.  The patient was initially diagnosed with atrial fibrillation 02/22/20 at her PCP office incidentally during a routine visit. She was started on Eliquis for a CHADS2VASC score of 4 and her metoprolol was increased. Echo showed preserved EF 60-65%, mod MR, mild AI. Has been followed by Malka So in Afib clinic.  The patient states she overall feels well. Has a lot of orthopedic issues which is her main issue. No chest pain, SOB, lightheadedness, dizziness, or SOB. Occasional palpitation but not bothersome. No bleeding issues on the apixaban. Has not been monitoring blood pressures at home but is going to get help from [facility] care management to do so. Blood pressure elevated today.  Past Medical History:  Diagnosis Date   Anxiety    Aortic insufficiency    Arthritis    Chronic kidney disease (CKD)    Depression    Diverticulosis    GERD (gastroesophageal reflux disease)    Hypercholesterolemia    Hypertension    Iron deficiency anemia    Mitral regurgitation     No past surgical history on file.  Current Medications: No outpatient medications have been marked as taking for the 08/07/22 encounter (Appointment) with Freada Bergeron, MD.     Allergies:   Aspirin and Pravastatin   Social History   Socioeconomic History   Marital status: Widowed    Spouse name: Not on file   Number of  children: 2   Years of education: 12   Highest education level: High school graduate  Occupational History   Occupation: Retired  Tobacco Use   Smoking status: Former    Packs/day: 0.25    Years: 10.00    Additional pack years: 0.00    Total pack years: 2.50    Types: Cigarettes   Smokeless tobacco: Never   Tobacco comments:    She smoked off and on  Vaping Use   Vaping Use: Not on file  Substance and Sexual Activity   Alcohol use: No   Drug use: No   Sexual activity: Not Currently  Other Topics Concern   Not on file  Social History Narrative   Not on file   Social Determinants of Health   Financial Resource Strain: Low Risk  (07/24/2019)   Overall Financial Resource Strain (CARDIA)    Difficulty of Paying Living Expenses: Not very hard  Food Insecurity: No Food Insecurity (07/24/2019)   Hunger Vital Sign    Worried About Running Out of Food in the Last Year: Never true    Ran Out of Food in the Last Year: Never true  Transportation Needs: No Transportation Needs (07/24/2019)   PRAPARE - Hydrologist (Medical): No    Lack of Transportation (Non-Medical): No  Physical Activity: Inactive (07/24/2019)   Exercise Vital Sign    Days of Exercise per Week:  0 days    Minutes of Exercise per Session: 0 min  Stress: No Stress Concern Present (07/24/2019)   [place]    Feeling of Stress : Only a little  Social Connections: Moderately Integrated (07/24/2019)   Social Connection and Isolation Panel [NHANES]    Frequency of Communication with Friends and Family: More than three times a week    Frequency of Social Gatherings with Friends and Family: Once a week    Attends Religious Services: More than 4 times per year    Active Member of [employer] or Organizations: Yes    Attends Archivist Meetings: More than 4 times per year    Marital Status: Widowed     Family History: The patient's  family history is not on file.  ROS:   Please see the history of present illness.    Review of Systems  Constitutional:  Negative for chills and fever.  HENT:  Negative for sore throat.   Eyes:  Negative for blurred vision and redness.  Respiratory:  Negative for shortness of breath.   Cardiovascular:  Positive for palpitations. Negative for chest pain, orthopnea, claudication, leg swelling and PND.  Gastrointestinal:  Negative for blood in stool, melena, nausea and vomiting.  Genitourinary:  Negative for hematuria.  Musculoskeletal:  Positive for joint pain and myalgias.  Neurological:  Negative for dizziness and loss of consciousness.  Endo/Heme/Allergies:  Negative for polydipsia.  Psychiatric/Behavioral:  Negative for substance abuse.     EKGs/Labs/Other Studies Reviewed:    The following studies were reviewed today: TTE 04/20/2020: 1. Left ventricular ejection fraction, by estimation, is 60 to 65%. The  left ventricle has normal function. The left ventricle has no regional  wall motion abnormalities. Left ventricular diastolic function could not  be evaluated.   2. Right ventricular systolic function is normal. The right ventricular  size is normal. There is moderately elevated pulmonary artery systolic  pressure.   3. Left atrial size was mildly dilated.   4. The mitral valve is normal in structure. Moderate mitral valve  regurgitation.   5. Tricuspid valve regurgitation is moderate.   6. The aortic valve is normal in structure. Aortic valve regurgitation is  mild. No aortic stenosis is present.  EKG:  EKG is  ordered today.  The ekg ordered today demonstrates atrial fibrillation with HR 87  Recent Labs: No results found for requested labs within last 365 days.  Recent Lipid Panel No results found for: "CHOL", "TRIG", "HDL", "CHOLHDL", "VLDL", "LDLCALC", "LDLDIRECT"   Risk Assessment/Calculations:    CHA2DS2-VASc Score = 4  This indicates a 4.8% annual risk of  stroke. The patient's score is based upon: CHF History: 0 HTN History: 1 Diabetes History: 0 Stroke History: 0 Vascular Disease History: 0 Age Score: 2 Gender Score: 1    Physical Exam:    VS:  There were no vitals taken for this visit.    Wt Readings from Last 3 Encounters:  03/05/22 108 lb (49 kg)  07/21/20 112 lb 9.6 oz (51.1 kg)  04/11/20 115 lb 6.4 oz (52.3 kg)     GEN:  Well nourished, well developed in no acute distress HEENT: Normal NECK: No JVD; No carotid bruits CARDIAC: Irregularly irregular, 2/6 systolic murmur best heard at RUSB. No rubs, gallops RESPIRATORY:  Clear to auscultation without rales, wheezing or rhonchi  ABDOMEN: Soft, non-tender, non-distended MUSCULOSKELETAL:  No edema; No deformity  SKIN: Warm and dry NEUROLOGIC:  Alert  and oriented x 3 PSYCHIATRIC:  Normal affect   ASSESSMENT:    No diagnosis found.  PLAN:    In order of problems listed above:  #Permanent Afib: CHADs-vasc 4. Declined further interventions to try to restore sinus rhythm. Minimal symptoms and rate controlled. TTE with normal EF, no WMA. Tolerating apixaban without issues -Continue apixaban 2.5mg  BID -Continue metop 50mg  daily  #Mild AI #Moderate MR: Stable from TTE in 2018. -Serial TTEs for monitoring--next in 2024  #HTN: Elevated in the office. Not monitoring at home. -Will send new blood pressure cuff -Continue amlodipine 5mg  daily -Continue metop 50mg  XL -Patient will keep log and call us with the results    Medication Adjustments/Labs and Tests Ordered: Current medicines are reviewed at length with the patient today.  Concerns regarding medicines are outlined above.  No orders of the defined types were placed in this encounter.  No orders of the defined types were placed in this encounter.   There are no Patient Instructions on file for this visit.    Signed, Freada Bergeron, MD  08/03/2022 3:19 PM    [place]

## 2022-08-05 DIAGNOSIS — F325 Major depressive disorder, single episode, in full remission: Secondary | ICD-10-CM | POA: Diagnosis not present

## 2022-08-05 DIAGNOSIS — Z79899 Other long term (current) drug therapy: Secondary | ICD-10-CM | POA: Diagnosis not present

## 2022-08-05 DIAGNOSIS — Z0000 Encounter for general adult medical examination without abnormal findings: Secondary | ICD-10-CM | POA: Diagnosis not present

## 2022-08-05 DIAGNOSIS — E440 Moderate protein-calorie malnutrition: Secondary | ICD-10-CM | POA: Diagnosis not present

## 2022-08-05 DIAGNOSIS — I872 Venous insufficiency (chronic) (peripheral): Secondary | ICD-10-CM | POA: Diagnosis not present

## 2022-08-05 DIAGNOSIS — N1831 Chronic kidney disease, stage 3a: Secondary | ICD-10-CM | POA: Diagnosis not present

## 2022-08-05 DIAGNOSIS — M170 Bilateral primary osteoarthritis of knee: Secondary | ICD-10-CM | POA: Diagnosis not present

## 2022-08-05 DIAGNOSIS — L989 Disorder of the skin and subcutaneous tissue, unspecified: Secondary | ICD-10-CM | POA: Diagnosis not present

## 2022-08-05 DIAGNOSIS — I129 Hypertensive chronic kidney disease with stage 1 through stage 4 chronic kidney disease, or unspecified chronic kidney disease: Secondary | ICD-10-CM | POA: Diagnosis not present

## 2022-08-05 DIAGNOSIS — R269 Unspecified abnormalities of gait and mobility: Secondary | ICD-10-CM | POA: Diagnosis not present

## 2022-08-05 DIAGNOSIS — I480 Paroxysmal atrial fibrillation: Secondary | ICD-10-CM | POA: Diagnosis not present

## 2022-08-05 DIAGNOSIS — Z1331 Encounter for screening for depression: Secondary | ICD-10-CM | POA: Diagnosis not present

## 2022-08-05 DIAGNOSIS — E7800 Pure hypercholesterolemia, unspecified: Secondary | ICD-10-CM | POA: Diagnosis not present

## 2022-08-07 ENCOUNTER — Ambulatory Visit: Payer: Medicare Other | Attending: Cardiology | Admitting: Cardiology

## 2022-08-27 ENCOUNTER — Other Ambulatory Visit: Payer: Self-pay

## 2022-08-27 DIAGNOSIS — I4819 Other persistent atrial fibrillation: Secondary | ICD-10-CM

## 2022-08-27 MED ORDER — APIXABAN 2.5 MG PO TABS: 2.5000 mg | ORAL_TABLET | Freq: Two times a day (BID) | ORAL | 1 refills | Status: DC

## 2022-08-27 NOTE — Telephone Encounter (Signed)
Pt last saw Jari Favre, [place] on 03/05/22, last labs 08/05/22 Creat 0.81 per [facility] at [place], age 89, weight 49kg, based on specified criteria pt is on appropriate dosage of Eliquis 2.5mg  BID for afib.  Will refill rx.

## 2022-08-29 DIAGNOSIS — N1831 Chronic kidney disease, stage 3a: Secondary | ICD-10-CM | POA: Diagnosis not present

## 2022-08-29 DIAGNOSIS — I480 Paroxysmal atrial fibrillation: Secondary | ICD-10-CM | POA: Diagnosis not present

## 2022-08-29 DIAGNOSIS — M170 Bilateral primary osteoarthritis of knee: Secondary | ICD-10-CM | POA: Diagnosis not present

## 2022-08-29 DIAGNOSIS — F325 Major depressive disorder, single episode, in full remission: Secondary | ICD-10-CM | POA: Diagnosis not present

## 2022-08-29 DIAGNOSIS — I129 Hypertensive chronic kidney disease with stage 1 through stage 4 chronic kidney disease, or unspecified chronic kidney disease: Secondary | ICD-10-CM | POA: Diagnosis not present

## 2022-09-06 ENCOUNTER — Telehealth: Payer: Self-pay | Admitting: Dermatology

## 2022-09-06 NOTE — Telephone Encounter (Signed)
Unable to leave message for scheduling .

## 2022-09-12 DIAGNOSIS — M25511 Pain in right shoulder: Secondary | ICD-10-CM | POA: Diagnosis not present

## 2022-09-12 DIAGNOSIS — M170 Bilateral primary osteoarthritis of knee: Secondary | ICD-10-CM | POA: Diagnosis not present

## 2022-09-27 DIAGNOSIS — M170 Bilateral primary osteoarthritis of knee: Secondary | ICD-10-CM | POA: Diagnosis not present

## 2022-09-27 DIAGNOSIS — N1831 Chronic kidney disease, stage 3a: Secondary | ICD-10-CM | POA: Diagnosis not present

## 2022-09-27 DIAGNOSIS — F325 Major depressive disorder, single episode, in full remission: Secondary | ICD-10-CM | POA: Diagnosis not present

## 2022-09-27 DIAGNOSIS — I129 Hypertensive chronic kidney disease with stage 1 through stage 4 chronic kidney disease, or unspecified chronic kidney disease: Secondary | ICD-10-CM | POA: Diagnosis not present

## 2022-09-27 DIAGNOSIS — M1712 Unilateral primary osteoarthritis, left knee: Secondary | ICD-10-CM | POA: Diagnosis not present

## 2022-09-27 DIAGNOSIS — I480 Paroxysmal atrial fibrillation: Secondary | ICD-10-CM | POA: Diagnosis not present

## 2022-10-28 DIAGNOSIS — N1831 Chronic kidney disease, stage 3a: Secondary | ICD-10-CM | POA: Diagnosis not present

## 2022-10-28 DIAGNOSIS — M170 Bilateral primary osteoarthritis of knee: Secondary | ICD-10-CM | POA: Diagnosis not present

## 2022-10-28 DIAGNOSIS — M1712 Unilateral primary osteoarthritis, left knee: Secondary | ICD-10-CM | POA: Diagnosis not present

## 2022-10-28 DIAGNOSIS — F325 Major depressive disorder, single episode, in full remission: Secondary | ICD-10-CM | POA: Diagnosis not present

## 2022-10-28 DIAGNOSIS — I129 Hypertensive chronic kidney disease with stage 1 through stage 4 chronic kidney disease, or unspecified chronic kidney disease: Secondary | ICD-10-CM | POA: Diagnosis not present

## 2022-10-28 DIAGNOSIS — I480 Paroxysmal atrial fibrillation: Secondary | ICD-10-CM | POA: Diagnosis not present

## 2022-11-13 ENCOUNTER — Ambulatory Visit: Payer: Medicare Other | Admitting: Dermatology

## 2022-11-28 DIAGNOSIS — I480 Paroxysmal atrial fibrillation: Secondary | ICD-10-CM | POA: Diagnosis not present

## 2022-11-28 DIAGNOSIS — F325 Major depressive disorder, single episode, in full remission: Secondary | ICD-10-CM | POA: Diagnosis not present

## 2022-11-28 DIAGNOSIS — N1831 Chronic kidney disease, stage 3a: Secondary | ICD-10-CM | POA: Diagnosis not present

## 2022-11-28 DIAGNOSIS — M1712 Unilateral primary osteoarthritis, left knee: Secondary | ICD-10-CM | POA: Diagnosis not present

## 2022-11-28 DIAGNOSIS — M170 Bilateral primary osteoarthritis of knee: Secondary | ICD-10-CM | POA: Diagnosis not present

## 2022-11-28 DIAGNOSIS — I129 Hypertensive chronic kidney disease with stage 1 through stage 4 chronic kidney disease, or unspecified chronic kidney disease: Secondary | ICD-10-CM | POA: Diagnosis not present

## 2022-12-12 DIAGNOSIS — R54 Age-related physical debility: Secondary | ICD-10-CM | POA: Diagnosis not present

## 2022-12-12 DIAGNOSIS — I480 Paroxysmal atrial fibrillation: Secondary | ICD-10-CM | POA: Diagnosis not present

## 2022-12-12 DIAGNOSIS — E7800 Pure hypercholesterolemia, unspecified: Secondary | ICD-10-CM | POA: Diagnosis not present

## 2022-12-12 DIAGNOSIS — I129 Hypertensive chronic kidney disease with stage 1 through stage 4 chronic kidney disease, or unspecified chronic kidney disease: Secondary | ICD-10-CM | POA: Diagnosis not present

## 2022-12-12 DIAGNOSIS — I872 Venous insufficiency (chronic) (peripheral): Secondary | ICD-10-CM | POA: Diagnosis not present

## 2022-12-12 DIAGNOSIS — N1831 Chronic kidney disease, stage 3a: Secondary | ICD-10-CM | POA: Diagnosis not present

## 2022-12-12 DIAGNOSIS — E440 Moderate protein-calorie malnutrition: Secondary | ICD-10-CM | POA: Diagnosis not present

## 2022-12-12 DIAGNOSIS — R269 Unspecified abnormalities of gait and mobility: Secondary | ICD-10-CM | POA: Diagnosis not present

## 2022-12-12 DIAGNOSIS — F325 Major depressive disorder, single episode, in full remission: Secondary | ICD-10-CM | POA: Diagnosis not present

## 2022-12-12 DIAGNOSIS — M170 Bilateral primary osteoarthritis of knee: Secondary | ICD-10-CM | POA: Diagnosis not present

## 2022-12-26 DIAGNOSIS — N1831 Chronic kidney disease, stage 3a: Secondary | ICD-10-CM | POA: Diagnosis not present

## 2022-12-26 DIAGNOSIS — M170 Bilateral primary osteoarthritis of knee: Secondary | ICD-10-CM | POA: Diagnosis not present

## 2022-12-26 DIAGNOSIS — D631 Anemia in chronic kidney disease: Secondary | ICD-10-CM | POA: Diagnosis not present

## 2022-12-26 DIAGNOSIS — D6859 Other primary thrombophilia: Secondary | ICD-10-CM | POA: Diagnosis not present

## 2022-12-26 DIAGNOSIS — E440 Moderate protein-calorie malnutrition: Secondary | ICD-10-CM | POA: Diagnosis not present

## 2022-12-26 DIAGNOSIS — N281 Cyst of kidney, acquired: Secondary | ICD-10-CM | POA: Diagnosis not present

## 2022-12-26 DIAGNOSIS — Z9181 History of falling: Secondary | ICD-10-CM | POA: Diagnosis not present

## 2022-12-26 DIAGNOSIS — E7800 Pure hypercholesterolemia, unspecified: Secondary | ICD-10-CM | POA: Diagnosis not present

## 2022-12-26 DIAGNOSIS — K5790 Diverticulosis of intestine, part unspecified, without perforation or abscess without bleeding: Secondary | ICD-10-CM | POA: Diagnosis not present

## 2022-12-26 DIAGNOSIS — I872 Venous insufficiency (chronic) (peripheral): Secondary | ICD-10-CM | POA: Diagnosis not present

## 2022-12-26 DIAGNOSIS — F325 Major depressive disorder, single episode, in full remission: Secondary | ICD-10-CM | POA: Diagnosis not present

## 2022-12-26 DIAGNOSIS — D509 Iron deficiency anemia, unspecified: Secondary | ICD-10-CM | POA: Diagnosis not present

## 2022-12-26 DIAGNOSIS — I480 Paroxysmal atrial fibrillation: Secondary | ICD-10-CM | POA: Diagnosis not present

## 2022-12-26 DIAGNOSIS — J309 Allergic rhinitis, unspecified: Secondary | ICD-10-CM | POA: Diagnosis not present

## 2022-12-26 DIAGNOSIS — Z7901 Long term (current) use of anticoagulants: Secondary | ICD-10-CM | POA: Diagnosis not present

## 2022-12-26 DIAGNOSIS — F419 Anxiety disorder, unspecified: Secondary | ICD-10-CM | POA: Diagnosis not present

## 2022-12-26 DIAGNOSIS — K219 Gastro-esophageal reflux disease without esophagitis: Secondary | ICD-10-CM | POA: Diagnosis not present

## 2022-12-26 DIAGNOSIS — M810 Age-related osteoporosis without current pathological fracture: Secondary | ICD-10-CM | POA: Diagnosis not present

## 2022-12-26 DIAGNOSIS — K449 Diaphragmatic hernia without obstruction or gangrene: Secondary | ICD-10-CM | POA: Diagnosis not present

## 2022-12-26 DIAGNOSIS — I129 Hypertensive chronic kidney disease with stage 1 through stage 4 chronic kidney disease, or unspecified chronic kidney disease: Secondary | ICD-10-CM | POA: Diagnosis not present

## 2022-12-26 DIAGNOSIS — G8929 Other chronic pain: Secondary | ICD-10-CM | POA: Diagnosis not present

## 2022-12-26 DIAGNOSIS — M21069 Valgus deformity, not elsewhere classified, unspecified knee: Secondary | ICD-10-CM | POA: Diagnosis not present

## 2022-12-31 DIAGNOSIS — N1831 Chronic kidney disease, stage 3a: Secondary | ICD-10-CM | POA: Diagnosis not present

## 2022-12-31 DIAGNOSIS — I129 Hypertensive chronic kidney disease with stage 1 through stage 4 chronic kidney disease, or unspecified chronic kidney disease: Secondary | ICD-10-CM | POA: Diagnosis not present

## 2022-12-31 DIAGNOSIS — I872 Venous insufficiency (chronic) (peripheral): Secondary | ICD-10-CM | POA: Diagnosis not present

## 2022-12-31 DIAGNOSIS — M170 Bilateral primary osteoarthritis of knee: Secondary | ICD-10-CM | POA: Diagnosis not present

## 2022-12-31 DIAGNOSIS — I480 Paroxysmal atrial fibrillation: Secondary | ICD-10-CM | POA: Diagnosis not present

## 2022-12-31 DIAGNOSIS — G8929 Other chronic pain: Secondary | ICD-10-CM | POA: Diagnosis not present

## 2023-01-02 DIAGNOSIS — I872 Venous insufficiency (chronic) (peripheral): Secondary | ICD-10-CM | POA: Diagnosis not present

## 2023-01-02 DIAGNOSIS — I129 Hypertensive chronic kidney disease with stage 1 through stage 4 chronic kidney disease, or unspecified chronic kidney disease: Secondary | ICD-10-CM | POA: Diagnosis not present

## 2023-01-02 DIAGNOSIS — I480 Paroxysmal atrial fibrillation: Secondary | ICD-10-CM | POA: Diagnosis not present

## 2023-01-02 DIAGNOSIS — N1831 Chronic kidney disease, stage 3a: Secondary | ICD-10-CM | POA: Diagnosis not present

## 2023-01-02 DIAGNOSIS — G8929 Other chronic pain: Secondary | ICD-10-CM | POA: Diagnosis not present

## 2023-01-02 DIAGNOSIS — M170 Bilateral primary osteoarthritis of knee: Secondary | ICD-10-CM | POA: Diagnosis not present

## 2023-01-07 DIAGNOSIS — I872 Venous insufficiency (chronic) (peripheral): Secondary | ICD-10-CM | POA: Diagnosis not present

## 2023-01-07 DIAGNOSIS — M170 Bilateral primary osteoarthritis of knee: Secondary | ICD-10-CM | POA: Diagnosis not present

## 2023-01-07 DIAGNOSIS — G8929 Other chronic pain: Secondary | ICD-10-CM | POA: Diagnosis not present

## 2023-01-07 DIAGNOSIS — I480 Paroxysmal atrial fibrillation: Secondary | ICD-10-CM | POA: Diagnosis not present

## 2023-01-07 DIAGNOSIS — N1831 Chronic kidney disease, stage 3a: Secondary | ICD-10-CM | POA: Diagnosis not present

## 2023-01-07 DIAGNOSIS — I129 Hypertensive chronic kidney disease with stage 1 through stage 4 chronic kidney disease, or unspecified chronic kidney disease: Secondary | ICD-10-CM | POA: Diagnosis not present

## 2023-01-10 DIAGNOSIS — I480 Paroxysmal atrial fibrillation: Secondary | ICD-10-CM | POA: Diagnosis not present

## 2023-01-10 DIAGNOSIS — G8929 Other chronic pain: Secondary | ICD-10-CM | POA: Diagnosis not present

## 2023-01-10 DIAGNOSIS — M170 Bilateral primary osteoarthritis of knee: Secondary | ICD-10-CM | POA: Diagnosis not present

## 2023-01-10 DIAGNOSIS — I129 Hypertensive chronic kidney disease with stage 1 through stage 4 chronic kidney disease, or unspecified chronic kidney disease: Secondary | ICD-10-CM | POA: Diagnosis not present

## 2023-01-10 DIAGNOSIS — N1831 Chronic kidney disease, stage 3a: Secondary | ICD-10-CM | POA: Diagnosis not present

## 2023-01-10 DIAGNOSIS — I872 Venous insufficiency (chronic) (peripheral): Secondary | ICD-10-CM | POA: Diagnosis not present

## 2023-01-14 DIAGNOSIS — I129 Hypertensive chronic kidney disease with stage 1 through stage 4 chronic kidney disease, or unspecified chronic kidney disease: Secondary | ICD-10-CM | POA: Diagnosis not present

## 2023-01-14 DIAGNOSIS — I872 Venous insufficiency (chronic) (peripheral): Secondary | ICD-10-CM | POA: Diagnosis not present

## 2023-01-14 DIAGNOSIS — G8929 Other chronic pain: Secondary | ICD-10-CM | POA: Diagnosis not present

## 2023-01-14 DIAGNOSIS — M170 Bilateral primary osteoarthritis of knee: Secondary | ICD-10-CM | POA: Diagnosis not present

## 2023-01-14 DIAGNOSIS — I480 Paroxysmal atrial fibrillation: Secondary | ICD-10-CM | POA: Diagnosis not present

## 2023-01-14 DIAGNOSIS — N1831 Chronic kidney disease, stage 3a: Secondary | ICD-10-CM | POA: Diagnosis not present

## 2023-01-20 ENCOUNTER — Other Ambulatory Visit: Payer: Self-pay | Admitting: Physician Assistant

## 2023-01-21 NOTE — Telephone Encounter (Signed)
Former pt of Dr. Shari Prows, last seen by Jari Favre, PA 02/2022. Pt has not been scheduled with a new provider yet. Please review for refill. Thank you!

## 2023-01-22 MED ORDER — METOPROLOL SUCCINATE ER 50 MG PO TB24: 50.0000 mg | ORAL_TABLET | Freq: Every day | ORAL | 1 refills | Status: DC

## 2023-01-23 DIAGNOSIS — M170 Bilateral primary osteoarthritis of knee: Secondary | ICD-10-CM | POA: Diagnosis not present

## 2023-01-23 DIAGNOSIS — G8929 Other chronic pain: Secondary | ICD-10-CM | POA: Diagnosis not present

## 2023-01-23 DIAGNOSIS — I480 Paroxysmal atrial fibrillation: Secondary | ICD-10-CM | POA: Diagnosis not present

## 2023-01-23 DIAGNOSIS — N1831 Chronic kidney disease, stage 3a: Secondary | ICD-10-CM | POA: Diagnosis not present

## 2023-01-23 DIAGNOSIS — I872 Venous insufficiency (chronic) (peripheral): Secondary | ICD-10-CM | POA: Diagnosis not present

## 2023-01-23 DIAGNOSIS — I129 Hypertensive chronic kidney disease with stage 1 through stage 4 chronic kidney disease, or unspecified chronic kidney disease: Secondary | ICD-10-CM | POA: Diagnosis not present

## 2023-01-25 DIAGNOSIS — E440 Moderate protein-calorie malnutrition: Secondary | ICD-10-CM | POA: Diagnosis not present

## 2023-01-25 DIAGNOSIS — M21069 Valgus deformity, not elsewhere classified, unspecified knee: Secondary | ICD-10-CM | POA: Diagnosis not present

## 2023-01-25 DIAGNOSIS — K449 Diaphragmatic hernia without obstruction or gangrene: Secondary | ICD-10-CM | POA: Diagnosis not present

## 2023-01-25 DIAGNOSIS — E7800 Pure hypercholesterolemia, unspecified: Secondary | ICD-10-CM | POA: Diagnosis not present

## 2023-01-25 DIAGNOSIS — F325 Major depressive disorder, single episode, in full remission: Secondary | ICD-10-CM | POA: Diagnosis not present

## 2023-01-25 DIAGNOSIS — K219 Gastro-esophageal reflux disease without esophagitis: Secondary | ICD-10-CM | POA: Diagnosis not present

## 2023-01-25 DIAGNOSIS — Z9181 History of falling: Secondary | ICD-10-CM | POA: Diagnosis not present

## 2023-01-25 DIAGNOSIS — J309 Allergic rhinitis, unspecified: Secondary | ICD-10-CM | POA: Diagnosis not present

## 2023-01-25 DIAGNOSIS — F419 Anxiety disorder, unspecified: Secondary | ICD-10-CM | POA: Diagnosis not present

## 2023-01-25 DIAGNOSIS — N1831 Chronic kidney disease, stage 3a: Secondary | ICD-10-CM | POA: Diagnosis not present

## 2023-01-25 DIAGNOSIS — D509 Iron deficiency anemia, unspecified: Secondary | ICD-10-CM | POA: Diagnosis not present

## 2023-01-25 DIAGNOSIS — I872 Venous insufficiency (chronic) (peripheral): Secondary | ICD-10-CM | POA: Diagnosis not present

## 2023-01-25 DIAGNOSIS — M170 Bilateral primary osteoarthritis of knee: Secondary | ICD-10-CM | POA: Diagnosis not present

## 2023-01-25 DIAGNOSIS — D631 Anemia in chronic kidney disease: Secondary | ICD-10-CM | POA: Diagnosis not present

## 2023-01-25 DIAGNOSIS — I129 Hypertensive chronic kidney disease with stage 1 through stage 4 chronic kidney disease, or unspecified chronic kidney disease: Secondary | ICD-10-CM | POA: Diagnosis not present

## 2023-01-25 DIAGNOSIS — M810 Age-related osteoporosis without current pathological fracture: Secondary | ICD-10-CM | POA: Diagnosis not present

## 2023-01-25 DIAGNOSIS — Z7901 Long term (current) use of anticoagulants: Secondary | ICD-10-CM | POA: Diagnosis not present

## 2023-01-25 DIAGNOSIS — K5790 Diverticulosis of intestine, part unspecified, without perforation or abscess without bleeding: Secondary | ICD-10-CM | POA: Diagnosis not present

## 2023-01-25 DIAGNOSIS — I480 Paroxysmal atrial fibrillation: Secondary | ICD-10-CM | POA: Diagnosis not present

## 2023-01-25 DIAGNOSIS — D6859 Other primary thrombophilia: Secondary | ICD-10-CM | POA: Diagnosis not present

## 2023-01-25 DIAGNOSIS — N281 Cyst of kidney, acquired: Secondary | ICD-10-CM | POA: Diagnosis not present

## 2023-01-25 DIAGNOSIS — G8929 Other chronic pain: Secondary | ICD-10-CM | POA: Diagnosis not present

## 2023-01-28 DIAGNOSIS — G8929 Other chronic pain: Secondary | ICD-10-CM | POA: Diagnosis not present

## 2023-01-28 DIAGNOSIS — I480 Paroxysmal atrial fibrillation: Secondary | ICD-10-CM | POA: Diagnosis not present

## 2023-01-28 DIAGNOSIS — M170 Bilateral primary osteoarthritis of knee: Secondary | ICD-10-CM | POA: Diagnosis not present

## 2023-01-28 DIAGNOSIS — I872 Venous insufficiency (chronic) (peripheral): Secondary | ICD-10-CM | POA: Diagnosis not present

## 2023-01-28 DIAGNOSIS — N1831 Chronic kidney disease, stage 3a: Secondary | ICD-10-CM | POA: Diagnosis not present

## 2023-01-28 DIAGNOSIS — I129 Hypertensive chronic kidney disease with stage 1 through stage 4 chronic kidney disease, or unspecified chronic kidney disease: Secondary | ICD-10-CM | POA: Diagnosis not present

## 2023-01-29 ENCOUNTER — Telehealth: Payer: Self-pay | Admitting: Physician Assistant

## 2023-01-29 ENCOUNTER — Other Ambulatory Visit: Payer: Self-pay | Admitting: *Deleted

## 2023-01-29 DIAGNOSIS — I4819 Other persistent atrial fibrillation: Secondary | ICD-10-CM

## 2023-01-29 MED ORDER — APIXABAN 2.5 MG PO TABS: 2.5000 mg | ORAL_TABLET | Freq: Two times a day (BID) | ORAL | 1 refills | Status: DC

## 2023-01-29 NOTE — Telephone Encounter (Signed)
New Message:     Pharmacist says she needs Jari Favre, Supervising Doctor and the doctor's NPI number please. She have not see any physician since Dr Shari Prows left and no upcoming appointment with any physician.

## 2023-01-29 NOTE — Telephone Encounter (Signed)
Eliquis 2.5mg  refill request received. Patient is 89 years old, weight-49kg, Crea-0.81 on 08/05/22 via [facility] from [place], [place], and last seen by Jari Favre on 03/05/22. Dose is appropriate based on dosing criteria. Will send in refill to requested pharmacy.

## 2023-02-12 DIAGNOSIS — N1831 Chronic kidney disease, stage 3a: Secondary | ICD-10-CM | POA: Diagnosis not present

## 2023-02-12 DIAGNOSIS — I129 Hypertensive chronic kidney disease with stage 1 through stage 4 chronic kidney disease, or unspecified chronic kidney disease: Secondary | ICD-10-CM | POA: Diagnosis not present

## 2023-02-12 DIAGNOSIS — G8929 Other chronic pain: Secondary | ICD-10-CM | POA: Diagnosis not present

## 2023-02-12 DIAGNOSIS — M170 Bilateral primary osteoarthritis of knee: Secondary | ICD-10-CM | POA: Diagnosis not present

## 2023-02-12 DIAGNOSIS — I872 Venous insufficiency (chronic) (peripheral): Secondary | ICD-10-CM | POA: Diagnosis not present

## 2023-02-12 DIAGNOSIS — I480 Paroxysmal atrial fibrillation: Secondary | ICD-10-CM | POA: Diagnosis not present

## 2023-03-10 ENCOUNTER — Ambulatory Visit: Payer: Medicare Other | Admitting: Dermatology

## 2023-03-20 ENCOUNTER — Other Ambulatory Visit: Payer: Self-pay | Admitting: Physician Assistant

## 2023-03-28 DIAGNOSIS — N1831 Chronic kidney disease, stage 3a: Secondary | ICD-10-CM | POA: Diagnosis not present

## 2023-03-28 DIAGNOSIS — Z23 Encounter for immunization: Secondary | ICD-10-CM | POA: Diagnosis not present

## 2023-03-28 DIAGNOSIS — E440 Moderate protein-calorie malnutrition: Secondary | ICD-10-CM | POA: Diagnosis not present

## 2023-03-28 DIAGNOSIS — R269 Unspecified abnormalities of gait and mobility: Secondary | ICD-10-CM | POA: Diagnosis not present

## 2023-03-28 DIAGNOSIS — I480 Paroxysmal atrial fibrillation: Secondary | ICD-10-CM | POA: Diagnosis not present

## 2023-03-28 DIAGNOSIS — E7800 Pure hypercholesterolemia, unspecified: Secondary | ICD-10-CM | POA: Diagnosis not present

## 2023-03-28 DIAGNOSIS — Z0000 Encounter for general adult medical examination without abnormal findings: Secondary | ICD-10-CM | POA: Diagnosis not present

## 2023-03-28 DIAGNOSIS — I129 Hypertensive chronic kidney disease with stage 1 through stage 4 chronic kidney disease, or unspecified chronic kidney disease: Secondary | ICD-10-CM | POA: Diagnosis not present

## 2023-03-28 DIAGNOSIS — Z1331 Encounter for screening for depression: Secondary | ICD-10-CM | POA: Diagnosis not present

## 2023-03-28 DIAGNOSIS — D649 Anemia, unspecified: Secondary | ICD-10-CM | POA: Diagnosis not present

## 2023-03-28 DIAGNOSIS — R54 Age-related physical debility: Secondary | ICD-10-CM | POA: Diagnosis not present

## 2023-03-28 DIAGNOSIS — Z79899 Other long term (current) drug therapy: Secondary | ICD-10-CM | POA: Diagnosis not present

## 2023-03-28 DIAGNOSIS — F325 Major depressive disorder, single episode, in full remission: Secondary | ICD-10-CM | POA: Diagnosis not present

## 2023-03-28 DIAGNOSIS — Z7409 Other reduced mobility: Secondary | ICD-10-CM | POA: Diagnosis not present

## 2023-03-28 LAB — LAB REPORT - SCANNED: EGFR: 57

## 2023-04-15 ENCOUNTER — Other Ambulatory Visit: Payer: Self-pay | Admitting: Pharmacist

## 2023-04-15 DIAGNOSIS — I4819 Other persistent atrial fibrillation: Secondary | ICD-10-CM

## 2023-04-15 NOTE — Progress Notes (Signed)
04/15/2023 Name: Molly Neal MRN: [id number] DOB: 07/14/1931  Chief Complaint  Patient presents with   Medication Management    JAKAYLA SHAMES is a 89 y.o. year old female who presented for a telephone visit.   They were referred to the pharmacist by their PCP for assistance in managing  SDOH concerns and medication review . Traditional Medicare insurance only   Subjective:  Care Team: Primary Care Provider: Emilio Aspen, MD ; Next Scheduled Visit: 09/25/2023 Clinical Pharmacist: Marlowe Aschoff, PharmD  Medication Access/Adherence  [phone] [place], [place] - [address] [place] [address] Phone: [phone] Fax: [phone]  [facility] - [address], [place] - [address] [place] [address] Phone: [phone] Fax: [phone]  [facility] - [place], [place] - [address] [place] [address] Phone: [phone] Fax: [phone]   Patient reports affordability concerns with their medications: No  Patient reports access/transportation concerns to their pharmacy: No - gets there by 1 friend occasionally or transportation services Zenaida Niece did not come last time; aide couldn't come; so son in [place] had to call Benedetto Goad to get her home) Patient reports adherence concerns with their medications:  No    SDOH Concerns: Transportation to appointments, getting wheelchair  Aide goes grocery shopping for her (has concerns about giving her this much trust with money) Aide on Mon, T, Th, Fri for 2 hrs at a time- helps with household chores   Medication Management:  Medications are coming from [facility] now- doing well with this system (having concerns with controlled substances getting refilled late however)  *Has walker and a cane- needs lightweight wheelchair Grandson now lives in [place]- has no close  family to assist (relies on church friends and aide)  Objective:  No results found for: "HGBA1C"  Lab Results  Component Value Date   CREATININE 1.10 (H) 08/01/2020   BUN 35 (H) 08/01/2020   NA 139 08/01/2020   K 4.3 08/01/2020   CL 104 08/01/2020   CO2 24 08/01/2020    No results found for: "CHOL", "HDL", "LDLCALC", "LDLDIRECT", "TRIG", "CHOLHDL"  Medications Reviewed Today     Reviewed by Pollie Friar, RPH (Pharmacist) on 04/15/23 at 1516  Med List Status: <None>   Medication Order Taking? Sig Documenting Provider Last Dose Status Informant  amLODipine (NORVASC) 5 MG tablet [id number] Yes Take 5 mg by mouth every other day. [provider] Taking Active   apixaban (ELIQUIS) 2.5 MG TABS tablet [id number] Yes Take 1 tablet (2.5 mg total) by mouth 2 (two) times daily. Sharlene Dory, [place] Taking Active   Ascorbic Acid (VITAMIN C PO) [id number] Yes Take 1,000 mg by mouth daily. [provider] Taking Active   busPIRone (BUSPAR) 5 MG tablet [id number] Yes Take 5 mg by mouth 2 (two) times daily. [provider] Taking Active   cetirizine (ZYRTEC) 10 MG tablet [id number] Yes Take 10 mg by mouth every morning. [provider] Taking Active   Cholecalciferol (VITAMIN D3) 25 MCG (1000 UT) CAPS [id number] Yes Take 1 capsule by mouth every morning. [provider] Taking Active   FEROSUL 325 (65 Fe) MG tablet [id number] Yes Take 325 mg by mouth daily.  [provider] Taking Active   HYDROcodone-acetaminophen (NORCO/VICODIN) 5-325 MG per tablet [id number] Yes Take by mouth every 8 (eight) hours as needed. For 30 days [provider] Taking  Active Self           Med Note Delton See, LISA L   Wed Jul 20, 2014  3:27 PM)    LORazepam (ATIVAN) 0.5 MG tablet [id number] Yes Take 0.5 mg by mouth 2 (two) times daily as needed. [provider] Taking Active Self           Med Note Delton See, LISA Elbert Ewings   Wed Jul 20, 2014  3:27 PM)    metoprolol  succinate (TOPROL-XL) 25 MG 24 hr tablet [id number] Yes TAKE 1 TABLET BY MOUTH EVERY DAY AT BEDTIME Asa Lente, Tessa N, PA-C Taking Active   metoprolol succinate (TOPROL-XL) 50 MG 24 hr tablet [id number] Yes Take 1 tablet (50 mg total) by mouth daily. Take along with the 25 mg tablet to equal 75 mg daily Conte, Tessa N, [place] Taking Active   sertraline (ZOLOFT) 50 MG tablet [id number] Yes Take 50 mg by mouth daily.  [provider] Taking Active Self           Med Note Delton See, LISA L   Wed Jul 20, 2014  3:28 PM)    traMADol (ULTRAM) 50 MG tablet [id number] Yes Take 1 tablet (50 mg total) by mouth every 6 (six) hours as needed. Roxy Horseman, PA-C Taking Active Self  triamcinolone (KENALOG) 0.1 % [id number] No SMARTSIG:Sparingly Topical Twice Daily  Patient not taking: Reported on 04/15/2023   [provider] Not Taking Active               Assessment/Plan:   Medication Management: - Currently strategy sufficient to maintain appropriate adherence to prescribed medication regimen - [facility]- will find someone to pick-up these medications    Follow Up Plan:  - Call in 2 weeks to see about assistance options I have found - Need assistance for transportation - Need lightweight wheelchair  - On energy program already- [place] [employer] will send towards end of the year  Marlowe Aschoff, PharmD [facility] Phone Number: [phone]

## 2023-04-21 ENCOUNTER — Other Ambulatory Visit: Payer: Self-pay | Admitting: Physician Assistant

## 2023-04-22 ENCOUNTER — Telehealth: Payer: Self-pay | Admitting: *Deleted

## 2023-04-22 NOTE — Telephone Encounter (Signed)
   Telephone encounter was:  Successful.  04/22/2023 Name: Molly Neal MRN: [id number] DOB: [birth year]  Molly Neal is a 89 y.o. year old female who is a primary care patient of Emilio Aspen, MD . The community resource team was consulted for assistance with Transportation Needs  Patient needing transportation will mail access [place] application and also advised to follow up on medicaid transportation as she has used it previously  Care guide performed the following interventions: Patient provided with information about care guide support team and interviewed to confirm resource needs.  Follow Up Plan:  No further follow up planned at this time. The patient has been provided with needed resources. Dione Booze [facility]  Population Health Careguide  Direct Dial: [phone] Website: [email or web address]

## 2023-04-22 NOTE — Progress Notes (Signed)
  Care Coordination   Note   04/22/2023 Name: Molly Neal MRN: [id number] DOB: 08/05/1931  Molly Neal is a 89 y.o. year old female who sees Emilio Aspen, MD for primary care. I reached out to Esmond Harps by phone today to offer care coordination services.  [name] was given information about Care Coordination services today including:   The Care Coordination services include support from the care team which includes your Nurse Coordinator, Clinical Social Worker, or Pharmacist.  The Care Coordination team is here to help remove barriers to the health concerns and goals most important to you. Care Coordination services are voluntary, and the patient may decline or stop services at any time by request to their care team member.   Care Coordination Consent Status: Patient agreed to services and verbal consent obtained.   Follow up plan:  Telephone appointment with care coordination team member scheduled for:  05/08/23  Encounter Outcome:  Patient Scheduled  [facility] Coordination Care Guide  Direct Dial: [phone]

## 2023-04-22 NOTE — Addendum Note (Signed)
Addended by: Pollie Friar on: 04/22/2023 09:50 AM   Modules accepted: Orders

## 2023-04-30 ENCOUNTER — Other Ambulatory Visit: Payer: Self-pay | Admitting: Pharmacist

## 2023-04-30 NOTE — Progress Notes (Signed)
   04/30/2023 Name: Molly Neal MRN: [id number] DOB: Jul 05, 1931  Chief Complaint  Patient presents with   Medication Management    DELOROS TARDIFF is a 89 y.o. year old female who presented for a telephone visit.   They were referred to the pharmacist by their PCP for assistance in managing  SDOH concerns and medication review . Traditional Medicare insurance only   Subjective:  Care Team: Primary Care Provider: Emilio Aspen, MD ; Next Scheduled Visit: 09/25/2023 Clinical Pharmacist: Marlowe Aschoff, PharmD  Medication Access/Adherence  [facility]  [phone] [place] [facility], [place] - [address] [place] [address] Phone: [phone] Fax: [phone]  [facility] - [address] [place] - [address] [place] [address] Phone: [phone] Fax: [phone]  [facility] - [place], [place] - [address] [place] [address] Phone: [phone] Fax: [phone]   Patient reports affordability concerns with their medications: No  Patient reports access/transportation concerns to their pharmacy: No - gets there by 1 friend occasionally or transportation services Zenaida Niece did not come last time; aide couldn't come; so son in [place] had to call Benedetto Goad to get her home) Patient reports adherence concerns with their medications:  No    SDOH Concerns: Transportation to appointments, getting wheelchair  Aide goes grocery shopping for her (has concerns about giving her this much trust with money) Aide on Mon, T, Th, Fri for 2 hrs at a time- helps with household chores   Medication Management:  Med[facility] coming from [facility] now- doing well with this system (having concerns with controlled substances getting refilled late however)  *Has walker and a cane- recently received lightweight wheelchair Grandson now lives in [place]-  has no close family to assist (relies on church friends and aide)  Objective:  No results found for: "HGBA1C"  Lab Results  Component Value Date   CREATININE 1.10 (H) 08/01/2020   BUN 35 (H) 08/01/2020   NA 139 08/01/2020   K 4.3 08/01/2020   CL 104 08/01/2020   CO2 24 08/01/2020    No results found for: "CHOL", "HDL", "LDLCALC", "LDLDIRECT", "TRIG", "CHOLHDL"  Medications Reviewed Today   Medications were not reviewed in this encounter       Assessment/Plan:   Medication Management: - Currently strategy sufficient to maintain appropriate adherence to prescribed medication regimen - [facility]- will find someone to pick-up these medications   *Summary for PCP:**:  - Call in 1 month to ensure patient could attend podiatry appointment - Spoke to social worker about transportation concerns  - Plans to call social services transportation discussed 3 days in advance for 12/19 appointment  - May also discuss with god daughter- however they are having some relationship strain recently - Received lightweight wheelchair but would now need someone to push her- concerns that her aide will not go to appointments with her  - One of biggest recommendations would be to get an aide that can go to visits with her  *Excited to go to church with wheelchair now for Christmas- going to ask god daughter to bring her  Marlowe Aschoff, PharmD [facility] Phone Number: [phone]

## 2023-05-08 ENCOUNTER — Ambulatory Visit: Payer: Medicare Other | Admitting: Licensed Clinical Social Worker

## 2023-05-08 ENCOUNTER — Ambulatory Visit: Payer: Medicare Other | Admitting: Podiatry

## 2023-05-08 NOTE — Patient Outreach (Signed)
  Care Coordination   Initial Visit Note   05/08/2023 Name: Molly Neal MRN: [id number] DOB: 1931/08/04  Molly Neal is a 89 y.o. year old female who sees Emilio Aspen, MD for primary care. I spoke with  Esmond Harps by phone today.  What matters to the patients health and wellness today?  Transportation, Ramp for home and HHA increased hours     Goals Addressed             This Visit's Progress    Care Coordination Activites       Care Coordination Interventions: Patient stated that she has transportation issues for her medical appointments, even though she does not have a lot of appointments. Patient agreed to apply for [place] and wants to complete the application herself. SW offered to assist, patient stated that she can complete the application on her own. SW stated that when she follows up in a couple of weeks she will make sure that she did not have any issues completing the form. SW will mail the SCAT Part A to the patient. Patient  has an upcoming appointment in January and will be checking with friends and family to provided transportation.  Patient stated that is curretnly receivign HHA services trough [employer] and wants more hours increased. The SW will message the PCP and complete the form to send the PCP. The patient also had questions regarding her prescriptions and the SW will send the PCP a message. The Patient is also requesting a ramp at the apartments where she is living and she is going to contact her landlord and the SW will follow up and will also mail the patient resources for [facility]. The patient receives [employer] and the SW completed the questions for the SDOH and no other issues or concerns.  SW will follow up on 05/29/2023 at 10:30 am        SDOH assessments and interventions completed:  Yes  SDOH Interventions Today    Flowsheet Row Most Recent Value  SDOH Interventions   Food Insecurity Interventions Intervention Not Indicated   [Patient receives Mobile Meals]  Housing Interventions Intervention Not Indicated  Transportation Interventions SCAT (Specialized Community Area Transporation), Other (Comment)  [Will apply for SCAT]  Utilities Interventions Intervention Not Indicated        Care Coordination Interventions:  Yes, provided  Interventions Today    Flowsheet Row Most Recent Value  General Interventions   General Interventions Discussed/Reviewed General Interventions Discussed, [employer] will apply for SCAT and will Call the [facility] to see about a Life Alert System, SW will message the PCP about increased hours HHA]        Follow up plan: Follow up call scheduled for 05/29/2023 at 10:30 am    Encounter Outcome:  Patient Visit Completed   Jeanie Cooks, PhD [facility], [facility] Social Worker Direct Dial: [phone]  Fax: [phone]

## 2023-05-08 NOTE — Patient Instructions (Signed)
Visit Information  Thank you for taking time to visit with me today. Please don't hesitate to contact me if I can be of assistance to you.   Following are the goals we discussed today:   Goals Addressed             This Visit's Progress    Care Coordination Activites       Care Coordination Interventions: Patient stated that she has transportation issues for her medical appointments, even though she does not have a lot of appointments. Patient agreed to apply for SCAT and wants to complete the application herself. SW offered to assist, patient stated that she can complete the application on her own. SW stated that when she follows up in a couple of weeks she will make sure that she did not have any issues completing the form. SW will mail the SCAT Part A to the patient. Patient  has an upcoming appointment in January and will be checking with friends and family to provided transportation.  Patient stated that is curretnly receivign HHA services trough [employer] and wants more hours increased. The SW will message the PCP and complete the form to send the PCP. The patient also had questions regarding her prescriptions and the SW will send the PCP a message. The Patient is also requesting a ramp at the apartments where she is living and she is going to contact her landlord and the SW will follow up and will also mail the patient resources for [facility]. The patient receives [employer] and the SW completed the questions for the SDOH and no other issues or concerns.  SW will follow up on 05/29/2023 at 10:30 am        Our next appointment is by telephone on 05/29/2023 at 10:30 am  Please call the care guide team at [phone] if you need to cancel or reschedule your appointment.   If you are experiencing a Mental Health or Behavioral Health Crisis or need someone to talk to, please call the Suicide and Crisis Lifeline: 988 go to [facility] [address]  [place] [phone]) call 911  The patient verbalized understanding of instructions, educational materials, and care plan provided today and DECLINED offer to receive copy of patient instructions, educational materials, and care plan.   Jeanie Cooks, PhD [facility], [facility] Social Worker Direct Dial: [phone]  Fax: [phone]

## 2023-05-09 ENCOUNTER — Ambulatory Visit: Payer: Self-pay | Admitting: Licensed Clinical Social Worker

## 2023-05-09 NOTE — Patient Outreach (Signed)
SW Faxed the SCAT PART B and the [facility] 3051 form to be completed

## 2023-05-13 ENCOUNTER — Ambulatory Visit: Payer: Self-pay | Admitting: Licensed Clinical Social Worker

## 2023-05-13 NOTE — Patient Outreach (Signed)
  Care Coordination   Collaboration   Visit Note   05/13/2023 Name: CHIYO MARRON MRN: [id number] DOB: 1931-11-18  VETRA VANLEEUWEN is a 89 y.o. year old female who sees Emilio Aspen, MD for primary care. I  spoke with the PCP office Ocie Bob)   What matters to the patients health and wellness today?  Patient was not engaged during this encounter, SW informed that patient wanted to increase in her HHA hours, and the update of her prescriptions.   Goals Addressed   None     SDOH assessments and interventions completed:  No     Care Coordination Interventions:  Yes, provided  Interventions Today    Flowsheet Row Most Recent Value  General Interventions   General Interventions Discussed/Reviewed General Interventions Discussed, Communication with  Communication with PCP/Specialists  [To inform the PCP that the patient is requesting increased hours in HHA and to get an update on her prescriptions]       Follow up plan: Follow up call scheduled for 05/29/2023 at 10:30 am    Encounter Outcome:  Patient Visit Completed   Jeanie Cooks, PhD [facility], [facility] Social Worker Direct Dial: [phone]  Fax: [phone]

## 2023-05-29 ENCOUNTER — Ambulatory Visit: Payer: Self-pay | Admitting: Licensed Clinical Social Worker

## 2023-05-29 NOTE — Patient Instructions (Signed)
 Visit Information  Thank you for taking time to visit with me today. Please don't hesitate to contact me if I can be of assistance to you.   Following are the goals we discussed today:   Goals Addressed             This Visit's Progress    Care Coordination Activites   On track    Care Coordination Interventions: Patient stated that she has transportation issues for her medical appointments, even though she does not have a lot of appointments. Patient agreed to apply for SCAT and wants to complete the application herself. SW offered to assist, patient stated that she can complete the application on her own. SW stated that when she follows up in a couple of weeks she will make sure that she did not have any issues completing the form. SW will mail the SCAT Part A to the patient. Patient  has an upcoming appointment in January and will be checking with friends and family to provided transportation.  Patient stated that is curretnly receivign HHA services trough [employer] and wants more hours increased. The SW will message the PCP and complete the form to send the PCP. The patient also had questions regarding her prescriptions and the SW will send the PCP a message. The Patient is also requesting a ramp at the apartments where she is living and she is going to contact her landlord and the SW will follow up and will also mail the patient resources for [facility]. The patient receives [employer] and the SW completed the questions for the SDOH and no other issues or concerns.  SW will follow up on 06/12/2023 at 10:30 am        Our next appointment is by telephone on 06/12/2023 at 10:30 am  Please call the care guide team at [phone] if you need to cancel or reschedule your appointment.   If you are experiencing a Mental Health or Behavioral Health Crisis or need someone to talk to, please call the Suicide and Crisis Lifeline: 988 go to [facility] [address] [place] [phone]) call 911  The patient verbalized understanding of instructions, educational materials, and care plan provided today and DECLINED offer to receive copy of patient instructions, educational materials, and care plan.   Tobias CHARM Maranda HEDWIG, PhD [facility], [facility] Social Worker Direct Dial: [phone]  Fax: [phone]

## 2023-05-29 NOTE — Patient Outreach (Signed)
  Care Coordination   Follow Up Visit Note   05/29/2023 Name: Molly Neal MRN: [id number] DOB: Jun 18, 1931  Molly Neal is a 89 y.o. year old female who sees Molly Neal LABOR, MD for primary care. I spoke with  Molly Neal by phone today.  What matters to the patients health and wellness today?  Transportation and house ramp    Goals Addressed             This Visit's Progress    Care Coordination Activites   On track    Care Coordination Interventions: Patient stated that she has transportation issues for her medical appointments, even though she does not have a lot of appointments. Patient agreed to apply for SCAT and wants to complete the application herself. SW offered to assist, patient stated that she can complete the application on her own. SW stated that when she follows up in a couple of weeks she will make sure that she did not have any issues completing the form. SW will mail the SCAT Part A to the patient. Patient  has an upcoming appointment in January and will be checking with friends and family to provided transportation.  Patient stated that is curretnly receivign HHA services trough [employer] and wants more hours increased. The SW will message the PCP and complete the form to send the PCP. The patient also had questions regarding her prescriptions and the SW will send the PCP a message. The Patient is also requesting a ramp at the apartments where she is living and she is going to contact her landlord and the SW will follow up and will also mail the patient resources for [facility]. The patient receives [employer] and the SW completed the questions for the SDOH and no other issues or concerns.  SW will follow up on 06/12/2023 at 10:30 am        SDOH assessments and interventions completed:  Yes  SDOH Interventions Today    Flowsheet Row Most Recent Value  SDOH Interventions   Food Insecurity Interventions Intervention Not Indicated  Housing  Interventions Intervention Not Indicated  Transportation Interventions SCAT (Specialized [employer])  [Patient has received tha application and will complete and mail in]  Utilities Interventions Intervention Not Indicated        Care Coordination Interventions:  Yes, provided    Follow up plan: Follow up call scheduled for 06/12/2023 at 10:30 am    Encounter Outcome:  Patient Visit Completed   Molly CHARM Maranda HEDWIG, PhD [facility], [facility] Social Worker Direct Dial: [phone]  Fax: [phone]

## 2023-06-02 ENCOUNTER — Other Ambulatory Visit: Payer: Self-pay | Admitting: Pharmacist

## 2023-06-02 NOTE — Progress Notes (Addendum)
 06/02/2023 Name: Molly Neal MRN: [id number] DOB: 1931/07/02  Chief Complaint  Patient presents with   Medication Management    Molly Neal is a 89 y.o. year old female who presented for a telephone visit.   They were referred to the pharmacist by their PCP for assistance in managing  SDOH concerns and medication review . Traditional Medicare insurance only   Subjective:  Care Team: Primary Care Provider: Charlott Dorn LABOR, MD ; Next Scheduled Visit: 09/25/2023 Clinical Pharmacist: Molly Neal, PharmD  Medication Access/Adherence  [facility] [phone] [place], [place] - [address] [place] [address] Phone: [phone] Fax: [phone]  [name] - [address], [place] - [address] [place] [address] Phone: [phone] Fax: [phone]  [facility] [place]  - [place], [place] - [address] [place] [address] Phone: [phone] Fax: [phone]   Patient reports affordability concerns with their medications: No  Patient reports access/transportation concerns to their pharmacy: No - gets there by 1 friend occasionally or transportation services Molly Neal did not come last time; aide couldn't come; so son in [place]  had to call Molly Neal to get her home) Patient reports adherence concerns with their medications:  No    SDOH Concerns: Transportation to appointments- working closely with social worker to get SCAT set-up  Aide goes grocery shopping for her (has concerns about giving her this much trust with money) Aide on Mon, T, Th, Fri for 2 hrs at a time- helps with household chores   Medication Management:  Medications are coming from [facility] now- doing well with this system (having concerns with controlled substances getting refilled late however)  *Has walker and a cane- recently received lightweight  wheelchair Grandson now lives in [place] - has no close family to assist (relies on church friends and aide)  Objective:  No results found for: HGBA1C  Lab Results  Component Value Date   CREATININE 1.10 (H) 08/01/2020   BUN 35 (H) 08/01/2020   NA 139 08/01/2020   K 4.3 08/01/2020   CL 104 08/01/2020   CO2 24 08/01/2020    No results found for: CHOL, HDL, LDLCALC, LDLDIRECT, TRIG, CHOLHDL  Medications Reviewed Today   Medications were not reviewed in this encounter       Assessment/Plan:   Medication Management: - Currently strategy sufficient to maintain appropriate adherence to prescribed medication regimen - [facility]- will find someone to pick-up these medications   *Summary for PCP:**:  - Call in 3 months for another check-in - Working closely with child psychotherapist regarding getting wheelchair ramp for her home and SCAT set-up to address transportation concerns - Received lightweight wheelchair but would now need someone to push her- concerns that her aide will not go to appointments with her         - One of biggest recommendations would be to get an aide that can go to visits with her - Patient reports some concerns of taking more Lorazepam over Christmas due to social anxiety, but advised she is only prescribed 1 per day - [facility] due to [facility] shipments coming late at times- aide agreed to pick them up while on the phone;  will let me know if she changes her mind - Also provided phone number to call Podiatry to re-schedule visit that she  missed due to transportation issues   Molly Neal, PharmD [facility] Phone Number: [phone]

## 2023-06-12 ENCOUNTER — Ambulatory Visit: Payer: Self-pay | Admitting: Licensed Clinical Social Worker

## 2023-06-12 NOTE — Patient Outreach (Signed)
  Care Coordination   06/12/2023 Name: AERABELLA FREELAND MRN: [id number] DOB: 17-Oct-1931   Care Coordination Outreach Attempts:  An unsuccessful outreach was attempted for an appointment today.  Follow Up Plan:  Additional outreach attempts will be made to offer the patient complex care management information and services.   Encounter Outcome:  No Answer   Care Coordination Interventions:  No, not indicated  SW has spoken to patient on previous call and also has had some unsuccessful follow up calls. This was a 2nd follow up, unsuccessful.   Jeanie Cooks, PhD [facility], [facility] Social Worker Direct Dial: [phone]  Fax: [phone]

## 2023-06-27 ENCOUNTER — Ambulatory Visit: Payer: Medicare Other | Admitting: Podiatry

## 2023-06-30 ENCOUNTER — Ambulatory Visit: Payer: Self-pay | Admitting: Licensed Clinical Social Worker

## 2023-06-30 NOTE — Patient Outreach (Signed)
  Care Coordination   Follow Up Visit Note   06/30/2023 Name: Molly Neal MRN: [id number] DOB: [birth year]  Molly Neal is a 89 y.o. year old female who sees Benedetta Bradley, MD for primary care. I spoke with  Jannie Menghini by phone today.  What matters to the patients health and wellness today?  SACT and ramp for house    Goals Addressed             This Visit's Progress    Care Coordination Activites       Care Coordination Interventions: Patient stated that she has transportation issues for her medical appointments, even though she does not have a lot of appointments. Patient agreed to apply for SCAT and wants to complete the application herself. SW offered to assist, patient stated that she can complete the application on her own. SW stated that when she follows up in a couple of weeks she will make sure that she did not have any issues completing the form. SW will mail the SCAT Part A to the patient. Patient  has an upcoming appointment in January and will be checking with friends and family to provided transportation.  Patient stated that is curretnly receivign HHA services trough [employer] and wants more hours increased. The SW will message the PCP and complete the form to send the PCP. The patient also had questions regarding her prescriptions and the SW will send the PCP a message. The Patient is also requesting a ramp at the apartments where she is living and she is going to contact her landlord and the SW will follow up and will also mail the patient resources for [facility]. The patient receives [employer] and the SW completed the questions for the SDOH and no other issues or concerns.  SW will follow up on 07/21/2023 at 9:30 am        SDOH assessments and interventions completed:  Yes  SDOH Interventions Today    Flowsheet Row Most Recent Value  SDOH Interventions   Food Insecurity Interventions Intervention Not Indicated  Housing Interventions  Intervention Not Indicated  Transportation Interventions SCAT (Specialized Community Area Transporation)  [Patient is mailing out the completed SCAT application today, and has talked to the landlords helper about a ramp and waiting on an approval]  Utilities Interventions Intervention Not Indicated        Care Coordination Interventions:  Yes, provided  Interventions Today    Flowsheet Row Most Recent Value  General Interventions   General Interventions Discussed/Reviewed General Interventions Reviewed  [Patient has completed the SCAT application and is mailing it out today and has talked to the landlord and is waiting for the approval for a ramp for the apartment.]        Follow up plan: Follow up call scheduled for 07/21/2023 at 9:30 am    Encounter Outcome:  Patient Visit Completed   Jonda Neighbours, PhD [facility], [facility] Social Worker Direct Dial: [phone]  Fax: [phone]

## 2023-06-30 NOTE — Patient Instructions (Signed)
 Visit Information  Thank you for taking time to visit with me today. Please don't hesitate to contact me if I can be of assistance to you.   Following are the goals we discussed today:   Goals Addressed             This Visit's Progress    Care Coordination Activites       Care Coordination Interventions: Patient stated that she has transportation issues for her medical appointments, even though she does not have a lot of appointments. Patient agreed to apply for SCAT and wants to complete the application herself. SW offered to assist, patient stated that she can complete the application on her own. SW stated that when she follows up in a couple of weeks she will make sure that she did not have any issues completing the form. SW will mail the SCAT Part A to the patient. Patient  has an upcoming appointment in January and will be checking with friends and family to provided transportation.  Patient stated that is curretnly receivign HHA services trough [employer] and wants more hours increased. The SW will message the PCP and complete the form to send the PCP. The patient also had questions regarding her prescriptions and the SW will send the PCP a message. The Patient is also requesting a ramp at the apartments where she is living and she is going to contact her landlord and the SW will follow up and will also mail the patient resources for [facility]. The patient receives [employer] and the SW completed the questions for the SDOH and no other issues or concerns.  SW will follow up on 07/21/2023 at 9:30 am        Our next appointment is by telephone on 07/21/2023 at 9:30 am  Please call the care guide team at [phone] if you need to cancel or reschedule your appointment.   If you are experiencing a Mental Health or Behavioral Health Crisis or need someone to talk to, please call the Suicide and Crisis Lifeline: 988 go to [facility] [address], [place]  [phone]) call 911  The patient verbalized understanding of instructions, educational materials, and care plan provided today and DECLINED offer to receive copy of patient instructions, educational materials, and care plan.   Jonda Neighbours, PhD [facility], [facility] Social Worker Direct Dial: [phone]  Fax: [phone]

## 2023-07-10 ENCOUNTER — Ambulatory Visit: Payer: Medicare Other | Admitting: Podiatry

## 2023-07-18 ENCOUNTER — Encounter: Payer: Self-pay | Admitting: Podiatry

## 2023-07-18 ENCOUNTER — Ambulatory Visit (INDEPENDENT_AMBULATORY_CARE_PROVIDER_SITE_OTHER): Payer: Medicare Other | Admitting: Podiatry

## 2023-07-18 VITALS — Ht 62.0 in | Wt 108.0 lb

## 2023-07-18 DIAGNOSIS — M21611 Bunion of right foot: Secondary | ICD-10-CM | POA: Diagnosis not present

## 2023-07-18 DIAGNOSIS — M79674 Pain in right toe(s): Secondary | ICD-10-CM | POA: Diagnosis not present

## 2023-07-18 DIAGNOSIS — M79675 Pain in left toe(s): Secondary | ICD-10-CM

## 2023-07-18 DIAGNOSIS — B351 Tinea unguium: Secondary | ICD-10-CM | POA: Diagnosis not present

## 2023-07-18 DIAGNOSIS — M21612 Bunion of left foot: Secondary | ICD-10-CM

## 2023-07-18 DIAGNOSIS — D689 Coagulation defect, unspecified: Secondary | ICD-10-CM

## 2023-07-18 NOTE — Patient Instructions (Signed)
 More silicone pads can be purchased from:  [email or web address]  Toe spacer and looped toe spacer were dispensed to you you today

## 2023-07-18 NOTE — Progress Notes (Signed)
  Subjective:  Patient ID: Molly Neal, female    DOB: 03-07-1932,  MRN: [id number]  Chief Complaint  Patient presents with   Nail Problem    Pt is here due to nail problem she states when she originally made her appointment her toe nails were very long and thick but since then has had them cut nails are discolored but she states it has been like that for a while.    89 y.o. female presents with the above complaint. History confirmed with patient. Patient presenting with pain related to dystrophic thickened dystrophic nails.  She did recently have someone help trim the nails and did go for pedicure, she is unable to maintain the nails herself due to their thickness and mobility issues. Patient does not have a history of T2DM.  Patient is on Eliquis for A-fib.  Objective:  Physical Exam: warm, good capillary refill, pedal skin atrophic, diminished pedal hair growth. nail exam onychomycosis of the toenails, onycholysis, and dystrophic nails DP pulses palpable, PT pulses palpable, and protective sensation intact Left Foot:  Pain with palpation of nails due to elongation and dystrophic growth.  Right Foot: Pain with palpation of nails due to elongation and dystrophic growth.  Right first toenail has fallen off. Bilateral bunion deformities with some associated hammertoe contractures Assessment:   1. Pain due to onychomycosis of toenails of both feet   2. Coagulation defect (HCC)   3. Bilateral bunions      Plan:  Patient was evaluated and treated and all questions answered.   #Onychomycosis with pain  -Patiently recently had assistance with nail trim. -Findings consistent with onychomycosis.  Discussed with patient that we can help her maintain her toenails. -She can follow-up as needed for this as she did not require nail trim today and we will start her on regular schedule.  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  Does have associated hammertoe deformities.  Discussed use of appropriate shoe gear and making shoe modifications as needed.  Discussed accommodative padding.  Toe spacers dispensed today.  Return in about 8 weeks (around 09/12/2023) for Routine Foot Care.         Bronwen Betters, DPM [facility]

## 2023-07-21 ENCOUNTER — Other Ambulatory Visit: Payer: Self-pay | Admitting: Physician Assistant

## 2023-07-21 ENCOUNTER — Ambulatory Visit: Payer: Self-pay | Admitting: Licensed Clinical Social Worker

## 2023-07-21 DIAGNOSIS — I4819 Other persistent atrial fibrillation: Secondary | ICD-10-CM

## 2023-07-21 NOTE — Patient Outreach (Signed)
 Care Coordination   Follow Up Visit Note   07/21/2023 Name: Molly Neal MRN: [id number] DOB: Aug 01, 1931  Molly Neal is a 89 y.o. year old female who sees Molly Aspen, MD for primary care. I spoke with  Molly Neal by phone today.  What matters to the patients health and wellness today?  HHA and ramp for home    Goals Addressed             This Visit's Progress    Care Coordination Activites   On track    Care Coordination Interventions: Patient stated that she has transportation issues for her medical appointments, even though she does not have a lot of appointments. Patient agreed to apply for SCAT and wants to complete the application herself. SW offered to assist, patient stated that she can complete the application on her own. SW stated that when she follows up in a couple of weeks she will make sure that she did not have any issues completing the form. SW will mail the SCAT Part A to the patient. Patient  has an upcoming appointment in January and will be checking with friends and family to provided transportation.  Patient stated that is curretnly receivign HHA services trough [employer] and wants more hours increased. The SW will message the PCP and complete the form to send the PCP. The patient also had questions regarding her prescriptions and the SW will send the PCP a message. The Patient is also requesting a ramp at the apartments where she is living and she is going to contact her landlord and the SW will follow up and will also mail the patient resources for [facility]. The patient receives [employer] and the SW completed the questions for the SDOH and no other issues or concerns.  SW will follow up on 08/06/2023 at 10:30 am        SDOH assessments and interventions completed:  Yes     Care Coordination Interventions:  Yes, provided  Interventions Today    Flowsheet Row Most Recent Value  General Interventions   General Interventions  Discussed/Reviewed General Interventions Reviewed, [employer] does not want SCAT she has applied for DSS transportation]        Follow up plan: Follow up call scheduled for 08/07/2023 at 10:30 am    Encounter Outcome:  Patient Visit Completed   Molly Cooks, PhD [facility], [facility] Social Worker Direct Dial: [phone]  Fax: [phone]

## 2023-07-21 NOTE — Patient Instructions (Signed)
 Visit Information  Thank you for taking time to visit with me today. Please don't hesitate to contact me if I can be of assistance to you.   Following are the goals we discussed today:   Goals Addressed             This Visit's Progress    Care Coordination Activites   On track    Care Coordination Interventions: Patient stated that she has transportation issues for her medical appointments, even though she does not have a lot of appointments. Patient agreed to apply for SCAT and wants to complete the application herself. SW offered to assist, patient stated that she can complete the application on her own. SW stated that when she follows up in a couple of weeks she will make sure that she did not have any issues completing the form. SW will mail the SCAT Part A to the patient. Patient  has an upcoming appointment in January and will be checking with friends and family to provided transportation.  Patient stated that is curretnly receivign HHA services trough [facility] and wants more hours increased. The SW will message the PCP and complete the form to send the PCP. The patient also had questions regarding her prescriptions and the SW will send the PCP a message. The Patient is also requesting a ramp at the apartments where she is living and she is going to contact her landlord and the SW will follow up and will also mail the patient resources for [facility]. The patient receives [employer] and the SW completed the questions for the SDOH and no other issues or concerns.  SW will follow up on 08/07/2023 at 10:30 am        Our next appointment is by telephone on 08/07/2023 at 10:30 am  Please call the care guide team at [phone] if you need to cancel or reschedule your appointment.   If you are experiencing a Mental Health or Behavioral Health Crisis or need someone to talk to, please call the Suicide and Crisis Lifeline: 988 go to [facility] [address], [place] [phone]) call 911  The patient verbalized understanding of instructions, educational materials, and care plan provided today and DECLINED offer to receive copy of patient instructions, educational materials, and care plan.   Jeanie Cooks, PhD [facility], [facility] Social Worker Direct Dial: [phone]  Fax: [phone]

## 2023-07-22 NOTE — Telephone Encounter (Signed)
 Prescription refill request for Eliquis received. Indication: AF Last office visit: 03/05/22  T Conte PA-C Scr: 1.1 on 08/01/20  Epic Age: 89 Weight: 49kg  Based on above findings Eliquis 2.5mg  twice daily is the appropriate dose.  Pt is past due for MD visit and labs.  Message sent to schedulers to make appt.  Refill approved x 1 only.

## 2023-08-07 ENCOUNTER — Ambulatory Visit: Payer: Self-pay | Admitting: Licensed Clinical Social Worker

## 2023-08-07 NOTE — Patient Instructions (Signed)
 Visit Information  Thank you for taking time to visit with me today. Please don't hesitate to contact me if I can be of assistance to you.   Following are the goals we discussed today:   Goals Addressed             This Visit's Progress    Care Coordination Activites       Care Coordination Interventions: Patient stated that she has completed the SCAT part A application and mailed it back to SCAT. SW will follow up with SCAT to see if they have received the complete application.  Patient stated that she has received increased hours of HHA from [facility] care and will also be getting a new HHA to start on tomorrow. Patient stated that she still has not spoke to the landlord in regards to the ramp but will at some point.  SW will follow up on 08/21/2023 at 10:30 am        Our next appointment is by telephone on 08/21/2023 at 10:30 am  Please call the care guide team at [phone] if you need to cancel or reschedule your appointment.   If you are experiencing a Mental Health or Behavioral Health Crisis or need someone to talk to, please call the Suicide and Crisis Lifeline: 988 go to [facility] [address], [place] [phone]) call 911  The patient verbalized understanding of instructions, educational materials, and care plan provided today and DECLINED offer to receive copy of patient instructions, educational materials, and care plan.   Jeanie Cooks, PhD [facility], [facility] Social Worker Direct Dial: [phone]  Fax: [phone]

## 2023-08-07 NOTE — Patient Outreach (Signed)
 Care Coordination   Follow Up Visit Note   08/07/2023 Name: Molly Neal MRN: [id number] DOB: 1931-08-23  Molly Neal is a 89 y.o. year old female who sees Emilio Aspen, MD for primary care. I spoke with  Esmond Harps by phone today.  What matters to the patients health and wellness today?  HHA and SCAT    Goals Addressed             This Visit's Progress    Care Coordination Activites       Care Coordination Interventions: Patient stated that she has completed the SCAT part A application and mailed it back to [employer]. SW will follow up with SCAT to see if they have received the complete application.  Patient stated that she has received increased hours of HHA from [facility] care and will also be getting a new HHA to start on tomorrow. Patient stated that she still has not spoke to the landlord in regards to the ramp but will at some point.  SW will follow up on 08/21/2023 at 10:30 am        SDOH assessments and interventions completed:  Yes  SDOH Interventions Today    Flowsheet Row Most Recent Value  SDOH Interventions   Food Insecurity Interventions Intervention Not Indicated  Housing Interventions Intervention Not Indicated  Transportation Interventions SCAT ([employer])  [Patient has completed the SCAT Application and mailed it back]  Utilities Interventions Intervention Not Indicated        Care Coordination Interventions:  Yes, provided  Interventions Today    Flowsheet Row Most Recent Value  General Interventions   General Interventions Discussed/Reviewed General Interventions Reviewed  [Patient has completed the SCAT part A and has mailed it back to SCAT.]        Follow up plan: Follow up call scheduled for 08/21/2023 at 10:30 am    Encounter Outcome:  Patient Visit Completed   Jeanie Cooks, PhD [facility], [facility] Social Worker Direct Dial:  [phone]  Fax: [phone]

## 2023-08-19 ENCOUNTER — Other Ambulatory Visit: Payer: Self-pay | Admitting: Physician Assistant

## 2023-08-19 DIAGNOSIS — I4819 Other persistent atrial fibrillation: Secondary | ICD-10-CM

## 2023-08-20 NOTE — Telephone Encounter (Addendum)
 Eliquis 2.5mg  refill request received. Patient is 89 years old, weight-49kg, Crea-1.10 on 08/01/20-needs labs, Diagnosis-Afib, and last seen by Jari Favre on 03/05/22-needs appt. Dose is appropriate based on dosing criteria.  Call PCP with [facility] to get updated labs  and had to leave a message for [place]. Sent message for Schedulers for an appt.   Labs received from PCP 03/28/23 Crea-0.95 Pt needs Cardiologist Appt

## 2023-08-21 ENCOUNTER — Ambulatory Visit: Payer: Self-pay | Admitting: Licensed Clinical Social Worker

## 2023-08-21 NOTE — Patient Outreach (Signed)
 Care Coordination   08/21/2023 Name: LOURENE HOSTON MRN: [id number] DOB: 10/16/1931   Care Coordination Outreach Attempts:  An unsuccessful outreach was attempted for an appointment today.  Follow Up Plan:  Additional outreach attempts will be made to offer the patient complex care management information and services.   Encounter Outcome:  No Answer   Care Coordination Interventions:  No, not indicated    Jeanie Cooks, PhD [facility], [facility] Social Worker Direct Dial: [phone]  Fax: [phone]

## 2023-08-21 NOTE — Telephone Encounter (Signed)
 Called and spoke with. Made pt aware of overdue office visit. Transferred pt to scheduling.

## 2023-08-22 NOTE — Telephone Encounter (Signed)
 Called and spoke with pt. Pt states her Aide is coming this afternoon and she will call to schedule then to make sure the aide can bring her the scheduled appt.

## 2023-08-25 NOTE — Telephone Encounter (Addendum)
 Called pt to remind her of overdue office visit. Also called number listed for her grandson, Myrtie Hawk however person stated that was the wrong number.

## 2023-08-27 ENCOUNTER — Ambulatory Visit: Payer: Medicare Other | Admitting: Dermatology

## 2023-08-27 NOTE — Telephone Encounter (Signed)
 Called pt to schedule an appt with Cardiologist since last seen 03/05/22. There was no answer this morning and left another message. Pharmacy requesting an update

## 2023-09-01 ENCOUNTER — Telehealth: Payer: Self-pay | Admitting: Pharmacist

## 2023-09-01 ENCOUNTER — Telehealth: Payer: Self-pay | Admitting: Physician Assistant

## 2023-09-01 ENCOUNTER — Other Ambulatory Visit: Payer: Self-pay | Admitting: Pharmacist

## 2023-09-01 NOTE — Telephone Encounter (Signed)
  Per staff message, attempted to contact patient three times to schedule her overdue follow up. I have mailed recall letter after we received no response from messages left for patient.

## 2023-09-01 NOTE — Progress Notes (Signed)
   09/01/2023  Patient ID: Molly Neal, female   DOB: [birth year], 89 y.o.   MRN: [id number]  Unable Attempted to contact patient for scheduled appointment for medication management. Left HIPAA compliant message for patient to return my call at their convenience.    Based on fill history, patient should have all of the current medications she would need at this time. Will check-in again in 1 month, since meeting with PCP again soon and followed closely by social worker managing SDOH issues.   Delvin File, PharmD [facility] Health  Phone Number: [phone]

## 2023-09-02 NOTE — Progress Notes (Signed)
   09/02/2023  Patient ID: Molly Neal, female   DOB: 02-26-1932, 89 y.o.   MRN: [id number]  Received call back from the patient today. Reports she needs to schedule a visit with Cardiology for medication refills, but is not completely out of the medication yet. Said at her last visit, the nurse bailed on driving her last minute and she was not able to attend.  Asked if she needed their phone number and she declined, stating she already had it. Planning to call after our visit ended. Needs to collaborate with new nurse the timeframe in which they can attend a new visit. (Does not appear she called them yesterday based on Cardiology note.)  Also reports her medications came early, but she still has at least half a box open. Advised her shipments have only been coming 2 days early, box said to start on 4/18. Therefore, if she is not caught up, she likely missed a few days of medications at some point and needs to focus on adherence.  Also informed of next Social Worker planned call to be prepared to pick-up at that time. Following closely to get current SDOH concerns addressed.   No further concerns at this time.    Delvin File, PharmD [facility]  Phone Number: [phone]

## 2023-09-04 ENCOUNTER — Encounter: Admitting: Licensed Clinical Social Worker

## 2023-09-08 ENCOUNTER — Telehealth: Payer: Self-pay | Admitting: Licensed Clinical Social Worker

## 2023-09-08 ENCOUNTER — Encounter: Payer: Self-pay | Admitting: Licensed Clinical Social Worker

## 2023-09-22 ENCOUNTER — Other Ambulatory Visit: Payer: Self-pay

## 2023-09-22 ENCOUNTER — Other Ambulatory Visit: Payer: Self-pay | Admitting: Licensed Clinical Social Worker

## 2023-09-22 ENCOUNTER — Encounter: Payer: Self-pay | Admitting: Licensed Clinical Social Worker

## 2023-09-22 DIAGNOSIS — I4819 Other persistent atrial fibrillation: Secondary | ICD-10-CM

## 2023-09-22 NOTE — Telephone Encounter (Signed)
 Prescription refill request for Eliquis  received. Indication: Afib  Last office visit: 03/05/22 Rueben Cote)  Scr: 0.95 (03/28/23)  Age: 89 Weight: 49kg  Office visit overdue. Message sent to schedulers

## 2023-09-23 NOTE — Telephone Encounter (Signed)
 Called and spoke with pt and pt's home health aide, Tia concerning overdue office visit. Tia states pt's grandson, Richardson Chang manages all of pt's appointments (pt provided Triston's number, [phone]). I attempted to call grandson multiple times, no answer and unable to leave voicemail. Pt and pt's aide is aware that pt must scheduled an appt with her cardiologist for medication refills.

## 2023-09-24 NOTE — Telephone Encounter (Signed)
 Attempted to call pt and pt's grandson multiple times, no answer.

## 2023-09-25 ENCOUNTER — Ambulatory Visit: Payer: Medicare Other | Admitting: Podiatry

## 2023-09-30 ENCOUNTER — Telehealth: Payer: Self-pay | Admitting: Pharmacist

## 2023-09-30 ENCOUNTER — Other Ambulatory Visit: Payer: Self-pay | Admitting: Pharmacist

## 2023-09-30 NOTE — Progress Notes (Signed)
   09/30/2023  Patient ID: Molly Neal, female   DOB: [birth year], 89 y.o.   MRN: [id number]  Attempted to contact patient for scheduled appointment for medication management. Left HIPAA compliant message for patient to return my call at their convenience.      Delvin File, PharmD [facility] Health  Phone Number: [phone]

## 2023-10-09 ENCOUNTER — Telehealth: Payer: Self-pay | Admitting: Pharmacist

## 2023-10-09 NOTE — Progress Notes (Signed)
   10/09/2023  Patient ID: Molly Neal, female   DOB: Feb 14, 1932, 89 y.o.   MRN: [id number]  Called and spoke with the patient on the phone today.  Has all of her medications at this time. Only medication not sent in last [employer] shipment was Eliquis . However patient reports having Eliquis  in her current pack, so will not be out of medication until next Cardiology visit on 10/23/23. Adherence concerns noted in an earlier call, but patient reports taking as prescribed per usual.  Having more leg weakness lately. Took awhile to unlock the door for meal drop-off company to bring food this morning.   Has a new aide coming today who can help bring her to office visits, which should be very beneficial.   Lastly, wanted to notify that pain and anxiety medications are necessary and she is not trying to overuse them. Critical to manage her pain and anxiety.  Also discussed her other recent birthday on [birth year]. Happy to be receiving flowers and cards in the mail.    Delvin File, PharmD [facility] Health  Phone Number: [phone]

## 2023-10-23 ENCOUNTER — Ambulatory Visit: Attending: Nurse Practitioner | Admitting: Nurse Practitioner

## 2023-10-23 ENCOUNTER — Encounter: Payer: Self-pay | Admitting: Nurse Practitioner

## 2023-10-23 VITALS — BP 128/88 | HR 86 | Ht 62.0 in | Wt 108.0 lb

## 2023-10-23 DIAGNOSIS — I361 Nonrheumatic tricuspid (valve) insufficiency: Secondary | ICD-10-CM | POA: Diagnosis not present

## 2023-10-23 DIAGNOSIS — G8929 Other chronic pain: Secondary | ICD-10-CM | POA: Diagnosis not present

## 2023-10-23 DIAGNOSIS — I10 Essential (primary) hypertension: Secondary | ICD-10-CM | POA: Insufficient documentation

## 2023-10-23 DIAGNOSIS — E782 Mixed hyperlipidemia: Secondary | ICD-10-CM | POA: Insufficient documentation

## 2023-10-23 DIAGNOSIS — M25512 Pain in left shoulder: Secondary | ICD-10-CM | POA: Diagnosis not present

## 2023-10-23 DIAGNOSIS — I340 Nonrheumatic mitral (valve) insufficiency: Secondary | ICD-10-CM | POA: Diagnosis not present

## 2023-10-23 DIAGNOSIS — I351 Nonrheumatic aortic (valve) insufficiency: Secondary | ICD-10-CM | POA: Diagnosis not present

## 2023-10-23 DIAGNOSIS — I4819 Other persistent atrial fibrillation: Secondary | ICD-10-CM | POA: Diagnosis not present

## 2023-10-23 DIAGNOSIS — R600 Localized edema: Secondary | ICD-10-CM | POA: Insufficient documentation

## 2023-10-23 DIAGNOSIS — R2681 Unsteadiness on feet: Secondary | ICD-10-CM | POA: Insufficient documentation

## 2023-10-23 MED ORDER — APIXABAN 2.5 MG PO TABS: 2.5000 mg | ORAL_TABLET | Freq: Two times a day (BID) | ORAL | 11 refills | Status: AC

## 2023-10-23 MED ORDER — AMLODIPINE BESYLATE 5 MG PO TABS: 5.0000 mg | ORAL_TABLET | ORAL | 11 refills | Status: AC

## 2023-10-23 MED ORDER — METOPROLOL SUCCINATE ER 25 MG PO TB24: 25.0000 mg | ORAL_TABLET | Freq: Every day | ORAL | 11 refills | Status: AC

## 2023-10-23 MED ORDER — FUROSEMIDE 20 MG PO TABS: ORAL_TABLET | ORAL | 11 refills | Status: AC

## 2023-10-23 MED ORDER — METOPROLOL SUCCINATE ER 50 MG PO TB24: 50.0000 mg | ORAL_TABLET | Freq: Every day | ORAL | 11 refills | Status: AC

## 2023-10-23 NOTE — Progress Notes (Unsigned)
 Office Visit    Patient Name: Molly Neal Date of Encounter: 10/23/2023  Primary Care Provider:  Benedetta Bradley, MD Primary Cardiologist:  Jerryl Morin, DO  Chief Complaint    89 year old female with a history of aortic insufficiency, mitral valve regurgitation, permanent atrial fibrillation, hypertension, hyperlipidemia, CKD, GERD, iron deficiency anemia, anxiety, and depression who presents for follow-up related to valvular heart disease.  Past Medical History    Past Medical History:  Diagnosis Date   Anxiety    Aortic insufficiency    Arthritis    Chronic kidney disease (CKD)    Depression    Diverticulosis    GERD (gastroesophageal reflux disease)    Hypercholesterolemia    Hypertension    Iron deficiency anemia    Mitral regurgitation    History reviewed. No pertinent surgical history.  Allergies  Allergies  Allergen Reactions   Aspirin Swelling   Pravastatin      Labs/Other Studies Reviewed    The following studies were reviewed today:  Cardiac Studies & Procedures   ______________________________________________________________________________________________     ECHOCARDIOGRAM  ECHOCARDIOGRAM COMPLETE 03/22/2022  Narrative ECHOCARDIOGRAM REPORT    Patient Name:   Molly Neal Date of Exam: 03/22/2022 Medical Rec #:  [id number]          Height:       62.0 in Accession #:    [id number]         Weight:       108.0 lb Date of Birth:  21-Aug-1931          BSA:          1.471 m Patient Age:    89 years           BP:           122/70 mmHg Patient Gender: F                  HR:           90 bpm. Exam Location:  [address]  Procedure: 2D Echo, Cardiac Doppler and Color Doppler  Indications:    Mitral Regurgitation I34  History:        Patient has prior history of Echocardiogram examinations, most recent 03/21/2020. Mitral Valve Disease, Arrythmias:Atrial Fibrillation; Risk Factors:Hypertension.  Sonographer:    Joleen Navy RDCS Referring Phys: 21 TESSA N CONTE  IMPRESSIONS   1. Left ventricular ejection fraction, by estimation, is 60 to 65%. The left ventricle has normal function. The left ventricle has no regional wall motion abnormalities. Left ventricular diastolic parameters are indeterminate. 2. Right ventricular systolic function is normal. The right ventricular size is normal. There is mildly elevated pulmonary artery systolic pressure. The estimated right ventricular systolic pressure is 43.3 mmHg. 3. Left atrial size was severely dilated. 4. Right atrial size was severely dilated. 5. The mitral valve is grossly normal. Mild mitral valve regurgitation. 6. Tricuspid valve regurgitation is mild to moderate. 7. The aortic valve is tricuspid. Aortic valve regurgitation is mild. 8. The inferior vena cava is dilated in size with >50% respiratory variability, suggesting right atrial pressure of 8 mmHg.  Comparison(s): No significant change from prior study.  FINDINGS Left Ventricle: Left ventricular ejection fraction, by estimation, is 60 to 65%. The left ventricle has normal function. The left ventricle has no regional wall motion abnormalities. The left ventricular internal cavity size was normal in size. There is borderline left ventricular hypertrophy. Left ventricular diastolic parameters are indeterminate.  Right Ventricle:  The right ventricular size is normal. Right ventricular systolic function is normal. There is mildly elevated pulmonary artery systolic pressure. The tricuspid regurgitant velocity is 2.97 m/s, and with an assumed right atrial pressure of 8 mmHg, the estimated right ventricular systolic pressure is 43.3 mmHg.  Left Atrium: Left atrial size was severely dilated.  Right Atrium: Right atrial size was severely dilated.  Pericardium: There is no evidence of pericardial effusion.  Mitral Valve: The mitral valve is grossly normal. Mild mitral valve  regurgitation.  Tricuspid Valve: The tricuspid valve is grossly normal. Tricuspid valve regurgitation is mild to moderate.  Aortic Valve: The aortic valve is tricuspid. Aortic valve regurgitation is mild. Aortic regurgitation PHT measures 489 msec.  Pulmonic Valve: Pulmonic valve regurgitation is not visualized.  Aorta: The aortic root and ascending aorta are structurally normal, with no evidence of dilitation.  Venous: The inferior vena cava is dilated in size with greater than 50% respiratory variability, suggesting right atrial pressure of 8 mmHg.  IAS/Shunts: No atrial level shunt detected by color flow Doppler.   LEFT VENTRICLE PLAX 2D LVIDd:         3.70 cm LVIDs:         2.40 cm LV PW:         1.10 cm LV IVS:        1.00 cm LVOT diam:     2.10 cm LV SV:         53 LV SV Index:   36 LVOT Area:     3.46 cm  LV Volumes (MOD) LV vol d, MOD A2C: 49.5 ml LV vol d, MOD A4C: 47.1 ml LV vol s, MOD A2C: 15.6 ml LV vol s, MOD A4C: 16.8 ml LV SV MOD A2C:     33.9 ml LV SV MOD A4C:     47.1 ml LV SV MOD BP:      32.1 ml  RIGHT VENTRICLE RV Basal diam:  4.50 cm RV Mid diam:    3.60 cm RV S prime:     7.94 cm/s TAPSE (M-mode): 1.5 cm  LEFT ATRIUM             Index        RIGHT ATRIUM           Index LA diam:        4.50 cm 3.06 cm/m   RA Area:     28.90 cm LA Vol (A2C):   70.4 ml 47.85 ml/m  RA Volume:   103.00 ml 70.01 ml/m LA Vol (A4C):   82.3 ml 55.94 ml/m LA Biplane Vol: 77.2 ml 52.47 ml/m AORTIC VALVE LVOT Vmax:   79.10 cm/s LVOT Vmean:  52.400 cm/s LVOT VTI:    0.152 m AI PHT:      489 msec  AORTA Ao Root diam: 3.10 cm Ao Asc diam:  3.70 cm  MR Peak grad:    105.8 mmHg   TRICUSPID VALVE MR Mean grad:    72.0 mmHg    TR Peak grad:   35.3 mmHg MR Vmax:         514.33 cm/s  TR Vmax:        297.00 cm/s MR Vmean:        401.0 cm/s MR PISA:         1.57 cm     SHUNTS MR PISA Eff ROA: 12 mm       Systemic VTI:  0.15 m MR PISA Radius:  0.50 cm  Systemic Diam: 2.10 cm  Alois Arnt Electronically signed by Alois Arnt Signature Date/Time: 03/22/2022/3:32:24 PM    Final          ______________________________________________________________________________________________     Recent Labs: No results found for requested labs within last 365 days.  Recent Lipid Panel No results found for: "CHOL", "TRIG", "HDL", "CHOLHDL", "VLDL", "LDLCALC", "LDLDIRECT"  History of Present Illness    89 year old female with the above past medical history including aortic insufficiency, mitral valve regurgitation, permanent atrial fibrillation, hypertension, hyperlipidemia, CKD, GERD, iron deficiency anemia, anxiety, and depression.  Previously followed by Dr. Ardell Beauvais.  She has also been followed by the [facility]fib clinic for history of atrial fibrillation, now permanent, rate controlled on chronic Eliquis .  Also with mild valvular heart disease.  She was last seen in the office on 04/05/2022 and was stable from a cardiac standpoint.  Echocardiogram in 03/2022 showing EF 60 to 65%, normal resolution, no RWMA, normal RV systolic function, severe BAE, mild mitral valve regurgitation, mild to moderate tricuspid valve regurgitation, mild aortic valve regurgitation.  She presents today for follow-up accompanied by her patient care advocate. Since her last visit she has been stable overall from a cardiac standpoint.  Unfortunately, she has not received any medical care for the past year and a half.  She has a new patient care advocate/assistant who checks on her daily and is helping her reestablish with her doctors.  She does note some mild shortness of breath with activity.  She complains of severe left shoulder pain, worse with movement, palpation (this has been ongoing for some time).  She denies any chest pain.  She has wounds to her bilateral lower extremities, bilateral ankle edema.  She denies palpitations, dizziness, PND, orthopnea, weight gain.  She  reports some gait instability in the setting of musculoskeletal abnormalities (patient has severe kyphosis).  She denies any recent falls.  She lives alone.  Home Medications    Current Outpatient Medications  Medication Sig Dispense Refill   Ascorbic Acid (VITAMIN C PO) Take 1,000 mg by mouth daily.     busPIRone (BUSPAR) 5 MG tablet Take 5 mg by mouth 2 (two) times daily.     cetirizine (ZYRTEC) 10 MG tablet Take 10 mg by mouth every morning.     Cholecalciferol (VITAMIN D3) 25 MCG (1000 UT) CAPS Take 1 capsule by mouth every morning.     furosemide (LASIX) 20 MG tablet Take 1 tablet daily as needed for swelling, weight gain or shortness or breath. 30 tablet 11   HYDROcodone-acetaminophen (NORCO/VICODIN) 5-325 MG per tablet Take by mouth every 8 (eight) hours as needed. For 30 days  0   LORazepam (ATIVAN) 0.5 MG tablet Take 0.5 mg by mouth 2 (two) times daily as needed.  2   sertraline (ZOLOFT) 50 MG tablet Take 50 mg by mouth daily.   10   traMADol  (ULTRAM ) 50 MG tablet Take 1 tablet (50 mg total) by mouth every 6 (six) hours as needed. 15 tablet 0   amLODipine (NORVASC) 5 MG tablet Take 1 tablet (5 mg total) by mouth every other day. 30 tablet 11   apixaban  (ELIQUIS ) 2.5 MG TABS tablet Take 1 tablet (2.5 mg total) by mouth 2 (two) times daily. 60 tablet 11   FEROSUL 325 (65 Fe) MG tablet Take 325 mg by mouth daily.  (Patient not taking: Reported on 10/23/2023)     metoprolol  succinate (TOPROL -XL) 25 MG 24 hr tablet Take 1 tablet (25 mg total) by mouth  at bedtime. 30 tablet 11   metoprolol  succinate (TOPROL -XL) 50 MG 24 hr tablet Take 1 tablet (50 mg total) by mouth daily. Take along with the 25 mg tablet to equal 75 mg daily 30 tablet 11   No current facility-administered medications for this visit.     Review of Systems    She denies chest pain, palpitations, dyspnea, pnd, orthopnea, n, v, dizziness, syncope, edema, weight gain, or early satiety. All other systems reviewed and are  otherwise negative except as noted above.   Physical Exam    VS:  BP 128/88   Pulse 86   Ht 5\' 2"  (1.575 m)   Wt 108 lb (49 kg)   SpO2 95%   BMI 19.75 kg/m   GEN: Well nourished, well developed, in no acute distress. HEENT: normal. Neck: Supple, no JVD, carotid bruits, or masses. Cardiac: IRIR, 2/6 murmur, no rubs, or gallops. No clubbing, cyanosis, edema.  Radials/DP/PT 2+ and equal bilaterally.  Respiratory:  Respirations regular and unlabored, clear to auscultation bilaterally. GI: Soft, nontender, nondistended, BS + x 4. MS: Severe kyphosis, otherwise, no deformity or atrophy. Skin: warm and dry, no rash.  Small open wounds to bilateral lower extremities. Neuro:  Strength and sensation are intact. Psych: Normal affect.  Accessory Clinical Findings    ECG personally reviewed by me today - EKG Interpretation Date/Time:  Thursday October 23 2023 11:39:04 EDT Ventricular Rate:  86 PR Interval:    QRS Duration:  84 QT Interval:  348 QTC Calculation: 416 R Axis:   -54  Text Interpretation: Atrial fibrillation Low voltage QRS Left anterior fascicular block Nonspecific T wave abnormality When compared with ECG of 01-Aug-2020 21:01, No significant change was found Confirmed by Marlana Silvan ([id number]) on 10/23/2023 11:39:48 AM  - no acute changes.   Lab Results  Component Value Date   WBC 5.9 08/01/2020   HGB 12.2 08/01/2020   HCT 36.0 08/01/2020   MCV 86.8 08/01/2020   PLT 190 08/01/2020   Lab Results  Component Value Date   CREATININE 1.10 (H) 08/01/2020   BUN 35 (H) 08/01/2020   NA 139 08/01/2020   K 4.3 08/01/2020   CL 104 08/01/2020   CO2 24 08/01/2020   Lab Results  Component Value Date   ALT 18 08/01/2020   AST 41 08/01/2020   ALKPHOS 83 08/01/2020   BILITOT 0.9 08/01/2020   No results found for: "CHOL", "HDL", "LDLCALC", "LDLDIRECT", "TRIG", "CHOLHDL"  No results found for: "HGBA1C"  Assessment & Plan    1. Valvular heart disease: Echocardiogram in 03/2022  showing EF 60 to 65%, normal resolution, no RWMA, normal RV systolic function, severe BAE, mild mitral valve regurgitation, mild to moderate tricuspid valve regurgitation, mild aortic valve regurgitation.  Does report some mild shortness of breath with activity, she has nonpitting bilateral ankle edema (of note, she does take amlodipine).  Also with wounds to her bilateral lower extremities, chronic (recommend follow-up with PCP for ongoing management, she may benefit from referral to wound care). Generally well compensated on exam.  She does not wish to pursue any invasive procedures, therefore, will not repeat echocardiogram at this time.  Will have her start Lasix 20 mg daily as needed for swelling, weight gain, shortness of breath. Will likely need to consider goals of care discussion in the future.  2. Permanent atrial fibrillation: Rate controlled. Continue metoprolol , Eliquis .   3. Hypertension: BP well controlled. Continue current antihypertensive regimen.   4. Hyperlipidemia: LDL was 90 in  07/2022. Monitored and managed per PCP.  She is not on any lipid lowering therapy at this time.  5. CKD stage IIIa: Creatinine was 0.95 in 03/2023.  Monitored and managed per PCP.  6.  Gait instability/left shoulder pain: She has severe kyphosis, which is now affecting the alignment of her legs as well as her gait.  She ambulates with a walker.  However, she lives alone.  She is a high fall risk which is a concern given Eliquis  use.  She would likely benefit from more regular supervision.  She also complains of significant left shoulder pain, this has been ongoing, worse with movement, palpation.  Appears to be musculoskeletal.  Recommend follow-up with PCP.  7. Disposition: Follow-up in in 3-4 months, she wishes to establish with Dr. Emmette Harms (she previously followed with Dr. Ardell Beauvais).       Jude Norton, NP 10/24/2023, 3:18 PM

## 2023-10-23 NOTE — Patient Instructions (Signed)
 Medication Instructions:  Lasix 20 mg daily as needed.  Continue other medications as directed. *If you need a refill on your cardiac medications before your next appointment, please call your pharmacy*  Lab Work: NONE ordered at this time of appointment   Testing/Procedures: NONE ordered at this time of appointment    Follow-Up: At [facility], you and your health needs are our priority.  As part of our continuing mission to provide you with exceptional heart care, our providers are all part of one team.  This team includes your primary Cardiologist (physician) and Advanced Practice Providers or APPs (Physician Assistants and Nurse Practitioners) who all work together to provide you with the care you need, when you need it.  Your next appointment:   3-4 month(s)  Provider:   Dr. Emmette Harms    We recommend signing up for the patient portal called "MyChart".  Sign up information is provided on this After Visit Summary.  MyChart is used to connect with patients for Virtual Visits (Telemedicine).  Patients are able to view lab/test results, encounter notes, upcoming appointments, etc.  Non-urgent messages can be sent to your provider as well.   To learn more about what you can do with MyChart, go to [email or web address].

## 2023-10-24 ENCOUNTER — Encounter: Payer: Self-pay | Admitting: Nurse Practitioner

## 2023-10-29 ENCOUNTER — Other Ambulatory Visit (HOSPITAL_COMMUNITY): Payer: Self-pay

## 2023-10-29 ENCOUNTER — Telehealth: Payer: Self-pay | Admitting: Cardiology

## 2023-10-29 MED ORDER — APIXABAN 2.5 MG PO TABS
2.5000 mg | ORAL_TABLET | Freq: Two times a day (BID) | ORAL | 0 refills | Status: AC
  Filled 2023-10-29: qty 60, 30d supply, fill #0

## 2023-10-29 MED ORDER — FUROSEMIDE 20 MG PO TABS
20.0000 mg | ORAL_TABLET | Freq: Every day | ORAL | 0 refills | Status: AC | PRN
  Filled 2023-10-29: qty 30, 30d supply, fill #0

## 2023-10-29 NOTE — Telephone Encounter (Signed)
 Pt c/o Shortness Of Breath: STAT if SOB developed within the last 24 hours or pt is noticeably SOB on the phone  1. Are you currently SOB (can you hear that pt is SOB on the phone)? no  2. How long have you been experiencing SOB? Patient states it has been going on for a while but not very often  3. Are you SOB when sitting or when up moving around? Laying down or up moving around too long  4. Are you currently experiencing any other symptoms? No  Patient states that she has asthma, has had some SOB since her last visit with Marlana Silvan, NP. Just an FYI

## 2023-10-29 NOTE — Telephone Encounter (Signed)
 Returned pt's phone call. She states she had received a phone call regarding making a follow up appointment with Dr. Emmette Harms and that she mentioned the shortness of breath to the representative, but this was not meant to be anything concerning. After reviewing last OV note (with Monge, NP) from 10/23/23 I asked pt if she had taken any Lasix . She states her pharmacy mails her meds once a month and she already received the package, which does NOT contain Lasix  nor Eliquis . Will send in one month supply of Lasix  and Eliquis  to local [facility], per patient's request. Advised pt to call us  if she is not able to pick up these medications.   Pt was educated on using Lasix  20 mg daily prn for swelling, weight gain, and shortness of breath; she verbalized understanding.

## 2023-11-10 ENCOUNTER — Other Ambulatory Visit (HOSPITAL_COMMUNITY): Payer: Self-pay

## 2023-11-19 DIAGNOSIS — N1831 Chronic kidney disease, stage 3a: Secondary | ICD-10-CM | POA: Diagnosis not present

## 2023-11-19 DIAGNOSIS — I480 Paroxysmal atrial fibrillation: Secondary | ICD-10-CM | POA: Diagnosis not present

## 2023-11-19 DIAGNOSIS — E7800 Pure hypercholesterolemia, unspecified: Secondary | ICD-10-CM | POA: Diagnosis not present

## 2023-11-19 DIAGNOSIS — R54 Age-related physical debility: Secondary | ICD-10-CM | POA: Diagnosis not present

## 2023-11-19 DIAGNOSIS — F325 Major depressive disorder, single episode, in full remission: Secondary | ICD-10-CM | POA: Diagnosis not present

## 2023-11-19 DIAGNOSIS — Z79899 Other long term (current) drug therapy: Secondary | ICD-10-CM | POA: Diagnosis not present

## 2023-11-19 DIAGNOSIS — D649 Anemia, unspecified: Secondary | ICD-10-CM | POA: Diagnosis not present

## 2023-11-19 DIAGNOSIS — E440 Moderate protein-calorie malnutrition: Secondary | ICD-10-CM | POA: Diagnosis not present

## 2023-11-19 DIAGNOSIS — I872 Venous insufficiency (chronic) (peripheral): Secondary | ICD-10-CM | POA: Diagnosis not present

## 2023-11-19 DIAGNOSIS — I129 Hypertensive chronic kidney disease with stage 1 through stage 4 chronic kidney disease, or unspecified chronic kidney disease: Secondary | ICD-10-CM | POA: Diagnosis not present

## 2023-11-19 DIAGNOSIS — R269 Unspecified abnormalities of gait and mobility: Secondary | ICD-10-CM | POA: Diagnosis not present

## 2023-11-19 DIAGNOSIS — M170 Bilateral primary osteoarthritis of knee: Secondary | ICD-10-CM | POA: Diagnosis not present

## 2023-12-30 ENCOUNTER — Encounter (HOSPITAL_BASED_OUTPATIENT_CLINIC_OR_DEPARTMENT_OTHER): Attending: General Surgery | Admitting: General Surgery

## 2023-12-30 DIAGNOSIS — L97322 Non-pressure chronic ulcer of left ankle with fat layer exposed: Secondary | ICD-10-CM | POA: Insufficient documentation

## 2023-12-30 DIAGNOSIS — I872 Venous insufficiency (chronic) (peripheral): Secondary | ICD-10-CM | POA: Insufficient documentation

## 2023-12-30 DIAGNOSIS — R54 Age-related physical debility: Secondary | ICD-10-CM | POA: Diagnosis not present

## 2023-12-30 DIAGNOSIS — N1831 Chronic kidney disease, stage 3a: Secondary | ICD-10-CM | POA: Insufficient documentation

## 2023-12-30 DIAGNOSIS — L97312 Non-pressure chronic ulcer of right ankle with fat layer exposed: Secondary | ICD-10-CM | POA: Diagnosis not present

## 2023-12-30 DIAGNOSIS — E440 Moderate protein-calorie malnutrition: Secondary | ICD-10-CM | POA: Insufficient documentation

## 2024-01-06 ENCOUNTER — Encounter (HOSPITAL_BASED_OUTPATIENT_CLINIC_OR_DEPARTMENT_OTHER): Admitting: General Surgery

## 2024-01-06 DIAGNOSIS — E440 Moderate protein-calorie malnutrition: Secondary | ICD-10-CM | POA: Diagnosis not present

## 2024-01-06 DIAGNOSIS — I872 Venous insufficiency (chronic) (peripheral): Secondary | ICD-10-CM | POA: Diagnosis not present

## 2024-01-06 DIAGNOSIS — L97312 Non-pressure chronic ulcer of right ankle with fat layer exposed: Secondary | ICD-10-CM | POA: Diagnosis not present

## 2024-01-06 DIAGNOSIS — N1831 Chronic kidney disease, stage 3a: Secondary | ICD-10-CM | POA: Diagnosis not present

## 2024-01-06 DIAGNOSIS — R54 Age-related physical debility: Secondary | ICD-10-CM | POA: Diagnosis not present

## 2024-01-06 DIAGNOSIS — L97322 Non-pressure chronic ulcer of left ankle with fat layer exposed: Secondary | ICD-10-CM | POA: Diagnosis not present

## 2024-01-13 ENCOUNTER — Encounter (HOSPITAL_BASED_OUTPATIENT_CLINIC_OR_DEPARTMENT_OTHER): Admitting: General Surgery

## 2024-01-13 DIAGNOSIS — L97322 Non-pressure chronic ulcer of left ankle with fat layer exposed: Secondary | ICD-10-CM | POA: Diagnosis not present

## 2024-01-13 DIAGNOSIS — R54 Age-related physical debility: Secondary | ICD-10-CM | POA: Diagnosis not present

## 2024-01-13 DIAGNOSIS — E440 Moderate protein-calorie malnutrition: Secondary | ICD-10-CM | POA: Diagnosis not present

## 2024-01-13 DIAGNOSIS — N1831 Chronic kidney disease, stage 3a: Secondary | ICD-10-CM | POA: Diagnosis not present

## 2024-01-13 DIAGNOSIS — L97312 Non-pressure chronic ulcer of right ankle with fat layer exposed: Secondary | ICD-10-CM | POA: Diagnosis not present

## 2024-01-13 DIAGNOSIS — I872 Venous insufficiency (chronic) (peripheral): Secondary | ICD-10-CM | POA: Diagnosis not present

## 2024-01-22 ENCOUNTER — Encounter (HOSPITAL_BASED_OUTPATIENT_CLINIC_OR_DEPARTMENT_OTHER): Attending: General Surgery | Admitting: General Surgery

## 2024-01-22 DIAGNOSIS — I872 Venous insufficiency (chronic) (peripheral): Secondary | ICD-10-CM | POA: Insufficient documentation

## 2024-01-22 DIAGNOSIS — L97322 Non-pressure chronic ulcer of left ankle with fat layer exposed: Secondary | ICD-10-CM | POA: Diagnosis not present

## 2024-01-22 DIAGNOSIS — N1831 Chronic kidney disease, stage 3a: Secondary | ICD-10-CM | POA: Diagnosis not present

## 2024-01-22 DIAGNOSIS — L97312 Non-pressure chronic ulcer of right ankle with fat layer exposed: Secondary | ICD-10-CM | POA: Diagnosis not present

## 2024-01-22 DIAGNOSIS — Z6821 Body mass index (BMI) 21.0-21.9, adult: Secondary | ICD-10-CM | POA: Diagnosis not present

## 2024-01-22 DIAGNOSIS — E440 Moderate protein-calorie malnutrition: Secondary | ICD-10-CM | POA: Diagnosis not present

## 2024-01-22 DIAGNOSIS — R54 Age-related physical debility: Secondary | ICD-10-CM | POA: Insufficient documentation

## 2024-01-29 ENCOUNTER — Ambulatory Visit: Attending: Cardiology | Admitting: Cardiology

## 2024-01-30 ENCOUNTER — Ambulatory Visit (HOSPITAL_BASED_OUTPATIENT_CLINIC_OR_DEPARTMENT_OTHER): Admitting: General Surgery

## 2024-02-03 ENCOUNTER — Encounter (HOSPITAL_BASED_OUTPATIENT_CLINIC_OR_DEPARTMENT_OTHER): Admitting: General Surgery

## 2024-02-03 DIAGNOSIS — I872 Venous insufficiency (chronic) (peripheral): Secondary | ICD-10-CM | POA: Diagnosis not present

## 2024-02-03 DIAGNOSIS — R54 Age-related physical debility: Secondary | ICD-10-CM | POA: Diagnosis not present

## 2024-02-03 DIAGNOSIS — L97312 Non-pressure chronic ulcer of right ankle with fat layer exposed: Secondary | ICD-10-CM | POA: Diagnosis not present

## 2024-02-03 DIAGNOSIS — N1831 Chronic kidney disease, stage 3a: Secondary | ICD-10-CM | POA: Diagnosis not present

## 2024-02-03 DIAGNOSIS — L97322 Non-pressure chronic ulcer of left ankle with fat layer exposed: Secondary | ICD-10-CM | POA: Diagnosis not present

## 2024-02-03 DIAGNOSIS — E440 Moderate protein-calorie malnutrition: Secondary | ICD-10-CM | POA: Diagnosis not present

## 2024-02-12 ENCOUNTER — Encounter (HOSPITAL_BASED_OUTPATIENT_CLINIC_OR_DEPARTMENT_OTHER): Admitting: General Surgery

## 2024-02-12 DIAGNOSIS — E440 Moderate protein-calorie malnutrition: Secondary | ICD-10-CM | POA: Diagnosis not present

## 2024-02-12 DIAGNOSIS — N1831 Chronic kidney disease, stage 3a: Secondary | ICD-10-CM | POA: Diagnosis not present

## 2024-02-12 DIAGNOSIS — L97312 Non-pressure chronic ulcer of right ankle with fat layer exposed: Secondary | ICD-10-CM | POA: Diagnosis not present

## 2024-02-12 DIAGNOSIS — R54 Age-related physical debility: Secondary | ICD-10-CM | POA: Diagnosis not present

## 2024-02-12 DIAGNOSIS — L97322 Non-pressure chronic ulcer of left ankle with fat layer exposed: Secondary | ICD-10-CM | POA: Diagnosis not present

## 2024-02-12 DIAGNOSIS — I872 Venous insufficiency (chronic) (peripheral): Secondary | ICD-10-CM | POA: Diagnosis not present

## 2024-02-18 ENCOUNTER — Ambulatory Visit (HOSPITAL_BASED_OUTPATIENT_CLINIC_OR_DEPARTMENT_OTHER): Admitting: Internal Medicine

## 2024-04-21 DIAGNOSIS — Z0000 Encounter for general adult medical examination without abnormal findings: Secondary | ICD-10-CM | POA: Diagnosis not present

## 2024-04-21 DIAGNOSIS — I872 Venous insufficiency (chronic) (peripheral): Secondary | ICD-10-CM | POA: Diagnosis not present

## 2024-04-21 DIAGNOSIS — D649 Anemia, unspecified: Secondary | ICD-10-CM | POA: Diagnosis not present

## 2024-04-21 DIAGNOSIS — F325 Major depressive disorder, single episode, in full remission: Secondary | ICD-10-CM | POA: Diagnosis not present

## 2024-04-21 DIAGNOSIS — Z23 Encounter for immunization: Secondary | ICD-10-CM | POA: Diagnosis not present

## 2024-04-21 DIAGNOSIS — M170 Bilateral primary osteoarthritis of knee: Secondary | ICD-10-CM | POA: Diagnosis not present

## 2024-04-21 DIAGNOSIS — Z1331 Encounter for screening for depression: Secondary | ICD-10-CM | POA: Diagnosis not present

## 2024-04-21 DIAGNOSIS — Z79899 Other long term (current) drug therapy: Secondary | ICD-10-CM | POA: Diagnosis not present

## 2024-04-21 DIAGNOSIS — L97309 Non-pressure chronic ulcer of unspecified ankle with unspecified severity: Secondary | ICD-10-CM | POA: Diagnosis not present

## 2024-04-21 DIAGNOSIS — I480 Paroxysmal atrial fibrillation: Secondary | ICD-10-CM | POA: Diagnosis not present

## 2024-04-21 DIAGNOSIS — E440 Moderate protein-calorie malnutrition: Secondary | ICD-10-CM | POA: Diagnosis not present

## 2024-04-21 DIAGNOSIS — M25512 Pain in left shoulder: Secondary | ICD-10-CM | POA: Diagnosis not present

## 2024-04-21 DIAGNOSIS — I129 Hypertensive chronic kidney disease with stage 1 through stage 4 chronic kidney disease, or unspecified chronic kidney disease: Secondary | ICD-10-CM | POA: Diagnosis not present

## 2024-04-21 DIAGNOSIS — N1831 Chronic kidney disease, stage 3a: Secondary | ICD-10-CM | POA: Diagnosis not present

## 2024-04-21 DIAGNOSIS — G8929 Other chronic pain: Secondary | ICD-10-CM | POA: Diagnosis not present

## 2024-05-26 ENCOUNTER — Ambulatory Visit: Admitting: Orthopedic Surgery

## 2024-06-02 ENCOUNTER — Other Ambulatory Visit (INDEPENDENT_AMBULATORY_CARE_PROVIDER_SITE_OTHER): Payer: Self-pay

## 2024-06-02 ENCOUNTER — Other Ambulatory Visit: Payer: Self-pay

## 2024-06-02 ENCOUNTER — Ambulatory Visit: Admitting: Orthopedic Surgery

## 2024-06-02 ENCOUNTER — Encounter: Payer: Self-pay | Admitting: Orthopedic Surgery

## 2024-06-02 DIAGNOSIS — M25512 Pain in left shoulder: Secondary | ICD-10-CM | POA: Diagnosis not present

## 2024-06-02 DIAGNOSIS — M19012 Primary osteoarthritis, left shoulder: Secondary | ICD-10-CM

## 2024-06-02 NOTE — Progress Notes (Signed)
 "  Office Visit Note   Patient: Molly Neal           Date of Birth: [birth year]           MRN: [id number] Visit Date: 06/02/2024 Requested by: Charlott Dorn LABOR, MD [address] [place],  [place] [address] PCP: Charlott Dorn LABOR, MD  Subjective: Chief Complaint  Patient presents with   Left Shoulder - Pain    HPI: Molly Neal is a 89 y.o. female who presents to the office reporting left shoulder pain.  Patient states she has decreased range of motion in the shoulder is very painful.  Wakes her from sleep with pain.  She is right-hand dominant.  Describes some catching.  It affects her ADLs.  Patient does not walk because of severe arthritis in her knees.  She is in a wheelchair.  Does have a long history of pain in that left shoulder which spans many years..                ROS: All systems reviewed are negative as they relate to the chief complaint within the history of present illness.  Patient denies fevers or chills.  Assessment & Plan: Visit Diagnoses:  1. Left shoulder pain, unspecified chronicity     Plan: Impression is end-stage arthritis and rotator cuff arthropathy in the left shoulder which is located today.  Deltoid is functional.  Ultrasound-guided injection into the glenohumeral joint is performed.  She has a lot of mitigating factors against surgical intervention.  We will see her back as needed.  Could consider a couple of injections per year into that shoulder if they help for symptoms.  She can follow-up with [facility] for further injections.  Follow-Up Instructions: No follow-ups on file.   Orders:  Orders Placed This Encounter  Procedures   XR Shoulder Left   US  Guided Needle Placement - No Linked Charges   No orders of the defined types were placed in this encounter.     Procedures: Large Joint Inj: L glenohumeral on 06/02/2024 9:04 PM Indications: diagnostic evaluation and pain Details: 22 G 3.5 in needle, ultrasound-guided  posterior approach  Arthrogram: No  Medications: 9 mL bupivacaine  0.5 %; 5 mL lidocaine  1 %; 40 mg triamcinolone  acetonide 40 MG/ML Outcome: tolerated well, no immediate complications Procedure, treatment alternatives, risks and benefits explained, specific risks discussed. Consent was given by the patient. Immediately prior to procedure a time out was called to verify the correct patient, procedure, equipment, support staff and site/side marked as required. Patient was prepped and draped in the usual sterile fashion.       Clinical Data: No additional findings.  Objective: Vital Signs: There were no vitals taken for this visit.  Physical Exam:  Constitutional: Patient appears well-developed HEENT:  Head: Normocephalic Eyes:EOM are normal Neck: Normal range of motion Cardiovascular: Normal rate Pulmonary/chest: Effort normal Neurologic: Patient is alert Skin: Skin is warm Psychiatric: Patient has normal mood and affect  Ortho Exam: Ortho exam demonstrates functional deltoid on the left.  Diminished and painful range of motion passively.  Rotator cuff strength also predictably diminished with internal and external rotation.  Radial pulses intact.  EPL FPL interosseous strength is intact in that left hand.  Specialty Comments:  No specialty comments available.  Imaging: US  Guided Needle Placement - No Linked Charges Result Date: 06/02/2024 Ultrasound imaging demonstrates needle placement into the glenohumeral joint with injection of fluid into the joint and no complicating features. left  PMFS History: Patient Active Problem List   Diagnosis Date Noted   Permanent atrial fibrillation (HCC) 04/11/2020   Persistent atrial fibrillation (HCC) 02/29/2020   Secondary hypercoagulable state 02/29/2020   Past Medical History:  Diagnosis Date   Anxiety    Aortic insufficiency    Arthritis    Chronic kidney disease (CKD)    Depression    Diverticulosis    GERD  (gastroesophageal reflux disease)    Hypercholesterolemia    Hypertension    Iron deficiency anemia    Mitral regurgitation     History reviewed. No pertinent family history.  History reviewed. No pertinent surgical history. Social History   Occupational History   Occupation: Retired  Tobacco Use   Smoking status: Former    Current packs/day: 0.25    Average packs/day: 0.3 packs/day for 10.0 years (2.5 ttl pk-yrs)    Types: Cigarettes   Smokeless tobacco: Never   Tobacco comments:    She smoked off and on  Vaping Use   Vaping status: Not on file  Substance and Sexual Activity   Alcohol use: No   Drug use: No   Sexual activity: Not Currently        "

## 2024-06-06 MED ORDER — BUPIVACAINE HCL 0.5 % IJ SOLN
9.0000 mL | INTRAMUSCULAR | Status: AC | PRN
  Administered 2024-06-02: 9 mL via INTRA_ARTICULAR

## 2024-06-06 MED ORDER — LIDOCAINE HCL 1 % IJ SOLN
5.0000 mL | INTRAMUSCULAR | Status: AC | PRN
  Administered 2024-06-02: 5 mL

## 2024-06-06 MED ORDER — TRIAMCINOLONE ACETONIDE 40 MG/ML IJ SUSP
40.0000 mg | INTRAMUSCULAR | Status: AC | PRN
  Administered 2024-06-02: 40 mg via INTRA_ARTICULAR
